# Patient Record
Sex: Male | Born: 1940
Health system: Southern US, Community
[De-identification: ages and names within clinical notes are randomized; demographics above are authoritative.]

## PROBLEM LIST (undated history)

## (undated) DIAGNOSIS — R3915 Urgency of urination: Secondary | ICD-10-CM

## (undated) DIAGNOSIS — M1712 Unilateral primary osteoarthritis, left knee: Secondary | ICD-10-CM

## (undated) DIAGNOSIS — A64 Unspecified sexually transmitted disease: Secondary | ICD-10-CM

## (undated) DIAGNOSIS — K208 Other esophagitis without bleeding: Secondary | ICD-10-CM

## (undated) DIAGNOSIS — R1013 Epigastric pain: Secondary | ICD-10-CM

## (undated) DIAGNOSIS — K311 Adult hypertrophic pyloric stenosis: Secondary | ICD-10-CM

## (undated) DIAGNOSIS — K219 Gastro-esophageal reflux disease without esophagitis: Secondary | ICD-10-CM

## (undated) DIAGNOSIS — K3189 Other diseases of stomach and duodenum: Secondary | ICD-10-CM

## (undated) DIAGNOSIS — E785 Hyperlipidemia, unspecified: Secondary | ICD-10-CM

## (undated) HISTORY — DX: Other diseases of stomach and duodenum: K31.89

## (undated) HISTORY — DX: Other esophagitis without bleeding: K20.80

## (undated) HISTORY — DX: Hyperlipidemia, unspecified: E78.5

## (undated) HISTORY — DX: Other esophagitis: K20.8

## (undated) HISTORY — PX: HERNIA REPAIR: SHX51

## (undated) HISTORY — DX: Adult hypertrophic pyloric stenosis: K31.1

## (undated) HISTORY — DX: Unspecified sexually transmitted disease: A64

## (undated) HISTORY — DX: Epigastric pain: R10.13

---

## 1994-08-07 HISTORY — PX: BUNIONECTOMY: SHX129

## 1994-08-07 HISTORY — PX: CIRCUMCISION: SUR203

## 2000-06-18 ENCOUNTER — Encounter: Payer: Self-pay | Admitting: Internal Medicine

## 2000-06-18 ENCOUNTER — Ambulatory Visit (HOSPITAL_COMMUNITY): Admission: RE | Admit: 2000-06-18 | Discharge: 2000-06-18 | Payer: Self-pay | Admitting: Internal Medicine

## 2007-03-06 ENCOUNTER — Ambulatory Visit: Payer: Self-pay | Admitting: Internal Medicine

## 2007-03-06 LAB — CONVERTED CEMR LAB
ALT: 22 units/L (ref 0–53)
AST: 24 units/L (ref 0–37)
Albumin: 4 g/dL (ref 3.5–5.2)
Alkaline Phosphatase: 86 units/L (ref 39–117)
BUN: 10 mg/dL (ref 6–23)
Basophils Absolute: 0 10*3/uL (ref 0.0–0.1)
Basophils Relative: 0.5 % (ref 0.0–1.0)
CO2: 32 meq/L (ref 19–32)
Calcium: 9.7 mg/dL (ref 8.4–10.5)
Chloride: 103 meq/L (ref 96–112)
Cholesterol: 264 mg/dL (ref 0–200)
Creatinine, Ser: 1.1 mg/dL (ref 0.4–1.5)
Direct LDL: 189.1 mg/dL
Eosinophils Absolute: 0.2 10*3/uL (ref 0.0–0.6)
Eosinophils Relative: 4 % (ref 0.0–5.0)
GFR calc Af Amer: 86 mL/min
GFR calc non Af Amer: 71 mL/min
Glucose, Bld: 104 mg/dL — ABNORMAL HIGH (ref 70–99)
HCT: 41.9 % (ref 39.0–52.0)
HDL: 30 mg/dL — ABNORMAL LOW (ref >39.0)
Hemoglobin: 14 g/dL (ref 13.0–17.0)
Lymphocytes Relative: 29 % (ref 12.0–46.0)
MCHC: 33.3 g/dL (ref 30.0–36.0)
MCV: 86.6 fL (ref 78.0–100.0)
Monocytes Absolute: 0.7 10*3/uL (ref 0.2–0.7)
Monocytes Relative: 12.6 % — ABNORMAL HIGH (ref 3.0–11.0)
Neutro Abs: 3.1 10*3/uL (ref 1.4–7.7)
Neutrophils Relative %: 53.9 % (ref 43.0–77.0)
Platelets: 235 10*3/uL (ref 150–400)
Potassium: 3.3 meq/L — ABNORMAL LOW (ref 3.5–5.1)
RBC: 4.84 M/uL (ref 4.22–5.81)
RDW: 12.8 % (ref 11.5–14.6)
Sodium: 141 meq/L (ref 135–145)
TSH: 0.99 microintl units/mL (ref 0.35–5.50)
Total Bilirubin: 1.1 mg/dL (ref 0.3–1.2)
Total CHOL/HDL Ratio: 8.8
Total Protein: 7.6 g/dL (ref 6.0–8.3)
Triglycerides: 170 mg/dL — ABNORMAL HIGH (ref 0–149)
VLDL: 34 mg/dL (ref 0–40)
WBC: 5.6 10*3/uL (ref 4.5–10.5)

## 2007-03-12 ENCOUNTER — Ambulatory Visit: Payer: Self-pay | Admitting: Internal Medicine

## 2007-03-14 ENCOUNTER — Ambulatory Visit: Payer: Self-pay | Admitting: Internal Medicine

## 2008-01-29 ENCOUNTER — Ambulatory Visit: Payer: Self-pay | Admitting: Internal Medicine

## 2008-01-29 DIAGNOSIS — K219 Gastro-esophageal reflux disease without esophagitis: Secondary | ICD-10-CM | POA: Insufficient documentation

## 2008-05-21 ENCOUNTER — Ambulatory Visit: Payer: Self-pay | Admitting: Internal Medicine

## 2008-05-21 DIAGNOSIS — F17200 Nicotine dependence, unspecified, uncomplicated: Secondary | ICD-10-CM | POA: Insufficient documentation

## 2008-05-23 DIAGNOSIS — K311 Adult hypertrophic pyloric stenosis: Secondary | ICD-10-CM | POA: Insufficient documentation

## 2008-05-23 DIAGNOSIS — K449 Diaphragmatic hernia without obstruction or gangrene: Secondary | ICD-10-CM | POA: Insufficient documentation

## 2008-06-30 ENCOUNTER — Encounter: Payer: Self-pay | Admitting: Internal Medicine

## 2008-12-01 ENCOUNTER — Ambulatory Visit: Payer: Self-pay | Admitting: Internal Medicine

## 2010-12-20 NOTE — Assessment & Plan Note (Signed)
[facility]                           PRIMARY CARE OFFICE NOTE   NAME:FERGUSONKadden, Osterhout                 MRN:          [id number]  DATE:03/06/2007                            DOB:          15-Jul-1941    Mr. Heart is a delightful 70 year old African-American gentleman,  last seen August 15, 2002, who presents for a followup evaluation and  exam.  He reports that in the interval he has been healthy with no new  medical problems, no surgeries, no injuries.   PAST MEDICAL HISTORY:   SURGICAL:  1. The patient had adult circumcision, 1996, secondary to balanitis.  2. Umbilical hernia repair.  3. Bunionectomy.   MEDICAL ILLNESSES:  1. Usual childhood disease.  2. __________ .  3. Episode of syphilis as a youth, medically treated.  4. Peptic ulcer disease.  5. Partial gout.  6. Gastric outlet obstruction.  7. Hiatal hernia.  8. Erosive esophagitis.   CURRENT MEDICATIONS:  No prescription drugs.   HABITS:  Tobacco, none,  the patient having quit smoking in 1994.  Alcohol, none, having quit drinking in 1994.   SOCIAL HISTORY:  The patient was married and divorced x2 and remains  single.  He has a grown daughter and grown son.  He has a third son who  has finished college.  He has 3 grandchildren.  The patient retired from  [employer] in 1996.  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   HEALTH MAINTENANCE:  The patient's last flexible sigmoidoscopy was  August of 2001 and was unremarkable.   REVIEW OF SYSTEMS:  Negative for any constitutional, cardiovascular,  respiratory, GI, or GU problems.   PHYSICAL EXAMINATION:  Temperature was 97.4, blood pressure 122/74,  pulse 100, weight 193, height 6 feet 2 inches.  GENERAL APPEARANCE:  A well-nourished, well-developed, mildly overweight  African-American gentleman in no acute distress.  HEENT EXAM:  Normocephalic, atraumatic.  EACs and  TMs were unremarkable.  Oropharynx with native dentition in good repair.  No buccal or palate  lesions were noted.  Posterior pharynx was clear.  Conjunctivae and  sclerae were clear.  PERRLA.  EOMI.  Funduscopic exam was unremarkable.  NECK:  Supple without thyromegaly.  NODES:  No lymphadenopathy was noted in the cervical or supraclavicular  regions.  CHEST:  No CVA tenderness.  LUNGS:  Clear to auscultation and percussion.  CARDIOVASCULAR:  2+ radial pulses, no JVD or carotid bruits.  He had a  quiet precordium with a regular rate and rhythm without murmurs, rubs,  or gallops.  ABDOMEN:  Soft, no guarding, no rebound.  No organosplenomegaly was  noted.  GENITALIA:  Normal male phallus.  Bilaterally descended testicles  without masses.  No evidence of recurrent balanitis.  RECTAL EXAM:  Normal sphincter tone was noted.  The prostate was smooth,  round, normal in size and contour without nodules.  EXTREMITIES:  The patient has a valgus deformity of the left knee with  significant knee enlargement.  Full range of motion is preserved, other  joints appear normal.  SKIN:  Clear.  NEUROLOGIC EXAM:  Nonfocal.   DATABASE:  The patient is sent for routine laboratories at today's  visit.  They include lipid panel, complete metabolic panel, CBC and TSH.   ASSESSMENT AND PLAN:  1. 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      He will be seeing Dr. Thurston Hole and will schedule his own appointment.      At this point I believe he is looking at total knee replacement.  2. Health maintenance:  The patient is scheduled for colonoscopy with      Dr. Lina Sar for August 19 at 1 o'clock with a preop visit      August 5 at 10:30, and the patient is aware of this appointment.   In summary, he is a pleasant gentleman who seems medically stable at  this time.  He will be notified by phone tree of his lab results with  appropriate medical recommendations as indicated by his  labs.  He will  keep his appointment for colonoscopy.   The patient will be contacting Dr. Thurston Hole for evaluation of a possible  total knee replacement.  If the patient is considering surgery, he is  medically stable and medically cleared for surgery.     Rosalyn Gess Norins, MD  Electronically Signed    MEN/MedQ  DD: 03/07/2007  DT: 03/07/2007  Job #: [id number]   cc:   Mr. Maycen Degregory A. Thurston Hole, M.D.

## 2012-01-30 ENCOUNTER — Ambulatory Visit (INDEPENDENT_AMBULATORY_CARE_PROVIDER_SITE_OTHER): Payer: 59 | Admitting: Internal Medicine

## 2012-01-30 ENCOUNTER — Other Ambulatory Visit (INDEPENDENT_AMBULATORY_CARE_PROVIDER_SITE_OTHER): Payer: 59

## 2012-01-30 ENCOUNTER — Encounter: Payer: Self-pay | Admitting: Internal Medicine

## 2012-01-30 VITALS — BP 122/94 | HR 80 | Temp 98.0°F | Resp 16 | Ht 74.0 in | Wt 201.0 lb

## 2012-01-30 DIAGNOSIS — Z136 Encounter for screening for cardiovascular disorders: Secondary | ICD-10-CM

## 2012-01-30 DIAGNOSIS — M1710 Unilateral primary osteoarthritis, unspecified knee: Secondary | ICD-10-CM

## 2012-01-30 DIAGNOSIS — Z0000 Encounter for general adult medical examination without abnormal findings: Secondary | ICD-10-CM

## 2012-01-30 DIAGNOSIS — E785 Hyperlipidemia, unspecified: Secondary | ICD-10-CM

## 2012-01-30 DIAGNOSIS — K3189 Other diseases of stomach and duodenum: Secondary | ICD-10-CM

## 2012-01-30 LAB — COMPREHENSIVE METABOLIC PANEL
Albumin: 4.2 g/dL (ref 3.5–5.2)
BUN: 18 mg/dL (ref 6–23)
CO2: 30 mEq/L (ref 19–32)
Calcium: 9.5 mg/dL (ref 8.4–10.5)
Chloride: 103 mEq/L (ref 96–112)
GFR: 85.87 mL/min (ref >60.00)
Glucose, Bld: 90 mg/dL (ref 70–99)
Potassium: 4 mEq/L (ref 3.5–5.1)
Sodium: 140 mEq/L (ref 135–145)
Total Protein: 7.9 g/dL (ref 6.0–8.3)

## 2012-01-30 LAB — HEPATIC FUNCTION PANEL
ALT: 22 U/L (ref 0–53)
Bilirubin, Direct: 0 mg/dL (ref 0.0–0.3)
Total Bilirubin: 0.4 mg/dL (ref 0.3–1.2)

## 2012-01-30 LAB — CBC WITH DIFFERENTIAL/PLATELET
Basophils Absolute: 0 10*3/uL (ref 0.0–0.1)
Basophils Relative: 0.2 % (ref 0.0–3.0)
Eosinophils Absolute: 0.1 10*3/uL (ref 0.0–0.7)
Eosinophils Relative: 1.3 % (ref 0.0–5.0)
HCT: 42.3 % (ref 39.0–52.0)
Hemoglobin: 14 g/dL (ref 13.0–17.0)
Lymphocytes Relative: 24.4 % (ref 12.0–46.0)
Lymphs Abs: 1.8 10*3/uL (ref 0.7–4.0)
MCHC: 33 g/dL (ref 30.0–36.0)
MCV: 86.9 fl (ref 78.0–100.0)
Monocytes Absolute: 0.7 10*3/uL (ref 0.1–1.0)
Monocytes Relative: 9.2 % (ref 3.0–12.0)
Neutro Abs: 4.8 10*3/uL (ref 1.4–7.7)
Neutrophils Relative %: 64.9 % (ref 43.0–77.0)
Platelets: 221 10*3/uL (ref 150.0–400.0)
RBC: 4.87 Mil/uL (ref 4.22–5.81)
RDW: 13.6 % (ref 11.5–14.6)
WBC: 7.4 10*3/uL (ref 4.5–10.5)

## 2012-01-30 LAB — LIPID PANEL
Cholesterol: 250 mg/dL — ABNORMAL HIGH (ref 0–200)
HDL: 56.4 mg/dL (ref >39.00)
Total CHOL/HDL Ratio: 4
Triglycerides: 174 mg/dL — ABNORMAL HIGH (ref 0.0–149.0)
VLDL: 34.8 mg/dL (ref 0.0–40.0)

## 2012-01-30 LAB — LDL CHOLESTEROL, DIRECT: Direct LDL: 175.4 mg/dL

## 2012-01-30 NOTE — Assessment & Plan Note (Signed)
LDL at 174 is out of control. Question of medication adherence  Plan  Check with patient on adherence  If adherent will need to change to either atorvastatin or aruvasttin

## 2012-01-30 NOTE — Progress Notes (Signed)
Subjective:    Patient ID: Brian Mora, male    DOB: 20-Feb-1941, 71 y.o.   MRN: [id number]  HPI The patient is here for annual Medicare wellness examination and management of other chronic and acute problems.  He has a bad knee left and is looking at TKR per Dr. Thurston Hole. He also has some dyspepsia for which he takes OTC H2 blockers as needed.  Otherwise he has been "ripping and runnin."   The risk factors are reflected in the social history.  The roster of all physicians providing medical care to patient - is listed in the Snapshot section of the chart.  Activities of daily living:  The patient is 100% inedpendent in all ADLs: dressing, toileting, feeding as well as independent mobility  Home safety : The patient has smoke detectors in the home. Fall - single story home fall safe. They wear seatbelts. firearms are present in the home, kept in a safe fashion. There is no violence in the home.   There is no risks for hepatitis, STDs or HIV. There is no   history of blood transfusion. They have no travel history to infectious disease endemic areas of the world.  The patient has seen their dentist in the last six month. They have not seen their eye doctor in the last year. They admit to hearing difficulty and have not had audiologic testing in the last year.  They do not  have excessive sun exposure. Discussed the need for sun protection: hats, long sleeves and use of sunscreen if there is significant sun exposure.   Diet: the importance of a healthy diet is discussed. They do have a bit of off  Diet, does take a supplement.  The patient has no regular exercise program.  The benefits of regular aerobic exercise were discussed.  Depression screen: there are no signs or vegative symptoms of depression- irritability, change in appetite, anhedonia, sadness/tearfullness.  Cognitive assessment: the patient manages all their financial and personal affairs and is actively engaged.   The  following portions of the patient's history were reviewed and updated as appropriate: allergies, current medications, past family history, past medical history,  past surgical history, past social history  and problem list.  Vision, hearing, body mass index were assessed and reviewed.   During the course of the visit the patient was educated and counseled about appropriate screening and preventive services including : fall prevention , diabetes screening, nutrition counseling, colorectal cancer screening, and recommended immunizations.  Past Medical History  Diagnosis Date  . 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 Other esophagitis     erosive esophagitis  . Acquired hypertrophic pyloric stenosis     gastric outlet obstruction  . Hyperlipidemia   . Dyspepsia and other specified disorders of function of stomach    Past Surgical History  Procedure Date  . Hernia repair   . Bunionectomy   . Circumcision 1996    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   Family History  Problem Relation Age of Onset  . Heart disease Mother   . Stroke Mother   . Benign prostatic hyperplasia Father   . Alzheimer's disease Father   . Diabetes Neg Hx   . COPD Neg Hx   . Cancer Sister    History   Social History  . Marital Status: Single    Spouse Name: N/A    Number of Children: 3  . Years of Education: 12   Occupational  History  . retired    Social History Main Topics  . Smoking status: Former Smoker    Quit date: 01/29/1993  . Smokeless tobacco: Never Used  . Alcohol Use: No  . Drug Use: No  . Sexually Active: Not on file   Other Topics Concern  . Not on file   Social History Narrative   HSG. Married '65 - 12 yrs/divorce, Married '80 -1 yr/divorced. 1 dtr - '67; 2 sons '63, '79; 4 grandchildren. Lives alone. 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    Current Outpatient Prescriptions on File Prior to Visit  Medication Sig Dispense  Refill  . lovastatin (MEVACOR) 20 MG tablet Take 20 mg by mouth at bedtime.          Review of Systems Constitutional:  Negative for fever, chills, activity change and unexpected weight change.  HEENT:  Negative for hearing loss, ear pain, congestion, neck stiffness and postnasal drip. Negative for sore throat or swallowing problems. Negative for dental complaints.   Eyes: Negative for vision loss or change in visual acuity.  Respiratory: Negative for chest tightness and wheezing. Negative for DOE.   Cardiovascular: Negative for chest pain or palpitations. No decreased exercise tolerance Gastrointestinal: No change in bowel habit. Positive for  bloating or gas,  reflux or indigestion Genitourinary: Negative for urgency, frequency, flank pain and difficulty urinating. nocturia x 1 Musculoskeletal: Negative for myalgias, back pain,  and gait problem. left knee arthritis - end stage. Neurological: Negative for dizziness, tremors, weakness and headaches.  Hematological: Negative for adenopathy.  Psychiatric/Behavioral: Negative for behavioral problems and dysphoric mood.       Objective:   Physical Exam Filed Vitals:   01/30/12 1047  BP: 122/94  Pulse: 80  Temp: 98 F (36.7 C)  Resp: 16   Wt Readings from Last 3 Encounters:  01/30/12 201 lb (91.173 kg)  12/01/08 195 lb (88.451 kg)  05/21/08 195 lb 8 oz (88.678 kg)    Gen'l: Well nourished well developed AA male in no acute distress  HEENT: Head: Normocephalic and atraumatic. Right Ear: External ear normal. EAC/TM nl. Left Ear: External ear normal.  EAC/TM nl. Nose: Nose normal. Mouth/Throat: Oropharynx is clear and moist. Dentition - native, in good repair. No buccal or palatal lesions. Posterior pharynx clear. Eyes: Conjunctivae and sclera clear. EOM intact. Pupils are equal, round, and reactive to light. Right eye exhibits no discharge. Left eye exhibits no discharge. Neck: Normal range of motion. Neck supple. No JVD present. No  tracheal deviation present. No thyromegaly present.  Cardiovascular: Normal rate, regular rhythm, no gallop, no friction rub, no murmur heard.      Quiet precordium. 2+ radial and DP pulses . No carotid bruits Pulmonary/Chest: Effort normal. No respiratory distress or increased WOB, no wheezes, no rales. No chest wall deformity or CVAT. Abdominal: Soft. Bowel sounds are normal in all quadrants. He exhibits mild distension, no tenderness, no rebound or guarding, No heptosplenomegaly  Genitourinary:  deferred Musculoskeletal: Normal range of motion. He exhibits no edema and no tenderness.       Small and large joints without redness, synovial thickening. Valgus deformity left knee. Full range of motion preserved about all small, median and large joints. Mildly atalgic gait Lymphadenopathy:    He has no cervical or supraclavicular adenopathy.  Neurological: He is alert and oriented to person, place, and time. CN II-XII intact. DTRs 2+ and symmetrical biceps, radial and patellar tendons. Cerebellar function normal with no tremor, rigidity,  normal gait and station.  Skin: Skin is warm and dry. No rash noted. No erythema.  Psychiatric: He has a normal mood and affect. His behavior is normal. Thought content normal.   Lab Results  Component Value Date   WBC 7.4 01/30/2012   HGB 14.0 01/30/2012   HCT 42.3 01/30/2012   PLT 221.0 01/30/2012   GLUCOSE 90 01/30/2012   CHOL 250* 01/30/2012   TRIG 174.0* 01/30/2012   HDL 56.40 01/30/2012   LDLDIRECT 175.4 01/30/2012        ALT 22 01/30/2012   AST 26 01/30/2012        NA 140 01/30/2012   K 4.0 01/30/2012   CL 103 01/30/2012   CREATININE 1.1 01/30/2012   BUN 18 01/30/2012   CO2 30 01/30/2012   TSH 0.99 03/06/2007   12 lead EKG - normal study w/o signs of ischemia or strain      Assessment & Plan:

## 2012-01-30 NOTE — Assessment & Plan Note (Signed)
Intermittent symptoms for which he takes prn H2 blockers and antacids  Plan Take Zantac on a regular basis, starting with qHS and moving to BID if needed.

## 2012-01-30 NOTE — Assessment & Plan Note (Signed)
Interval history significant for progressive knee pain and disability. PHysical exam is notable for mild weight gain. No EMR records for colonoscopy and immunization - will need follow-up.  In summary - a nice man who is medically stable but with poorly controlled cholesterol and who needs immunizations and colorectal cancer screening.

## 2012-01-30 NOTE — Assessment & Plan Note (Signed)
End-stage DJD left knee and for TKR in the next 6 months - Dr. Thurston Hole.  Plan Patient is medical stable and cleared for surgery and anesthesia

## 2012-01-31 ENCOUNTER — Telehealth: Payer: Self-pay | Admitting: *Deleted

## 2012-01-31 DIAGNOSIS — E785 Hyperlipidemia, unspecified: Secondary | ICD-10-CM

## 2012-01-31 NOTE — Telephone Encounter (Signed)
Patient called. States has not taken Mevacor in over a month, states takes it off and on.

## 2012-01-31 NOTE — Telephone Encounter (Signed)
Message copied by Elnora Morrison on Wed Jan 31, 2012  8:46 AM ------      Message from: Jacques Navy      Created: Tue Jan 30, 2012  7:24 PM       1. Call patient and ask if he is tking mevacor      2. Pull old chart for last colonoscopy and immunizations.

## 2012-01-31 NOTE — Telephone Encounter (Signed)
Take the mevacor and return in 1 month for lab.

## 2012-02-01 NOTE — Telephone Encounter (Signed)
Patient notified to take medication  Mevacor every day as prescribed and to follow up for lab work in one month

## 2012-02-02 ENCOUNTER — Other Ambulatory Visit: Payer: Self-pay | Admitting: Internal Medicine

## 2012-03-05 ENCOUNTER — Other Ambulatory Visit (INDEPENDENT_AMBULATORY_CARE_PROVIDER_SITE_OTHER): Payer: 59

## 2012-03-05 DIAGNOSIS — E785 Hyperlipidemia, unspecified: Secondary | ICD-10-CM

## 2012-03-05 LAB — LIPID PANEL
Cholesterol: 205 mg/dL — ABNORMAL HIGH (ref 0–200)
Total CHOL/HDL Ratio: 3
Triglycerides: 186 mg/dL — ABNORMAL HIGH (ref 0.0–149.0)
VLDL: 37.2 mg/dL (ref 0.0–40.0)

## 2012-03-15 ENCOUNTER — Encounter: Payer: Self-pay | Admitting: Internal Medicine

## 2012-10-24 ENCOUNTER — Encounter: Payer: Self-pay | Admitting: *Deleted

## 2012-10-25 ENCOUNTER — Ambulatory Visit (INDEPENDENT_AMBULATORY_CARE_PROVIDER_SITE_OTHER): Payer: Medicare Other | Admitting: Podiatry

## 2012-10-25 ENCOUNTER — Encounter: Payer: Self-pay | Admitting: Podiatry

## 2012-10-25 VITALS — BP 141/96 | HR 84 | Ht 74.0 in | Wt 193.0 lb

## 2012-10-25 DIAGNOSIS — M21619 Bunion of unspecified foot: Secondary | ICD-10-CM

## 2012-10-25 DIAGNOSIS — M2012 Hallux valgus (acquired), left foot: Secondary | ICD-10-CM

## 2012-10-25 DIAGNOSIS — B351 Tinea unguium: Secondary | ICD-10-CM

## 2012-10-25 DIAGNOSIS — L84 Corns and callosities: Secondary | ICD-10-CM

## 2012-10-25 NOTE — Progress Notes (Signed)
Subjective: 72 y.o. year old male patient presents complaining of painful nails and callus under the right great toe. Patient requests toe nails, corns and calluses trimmed.  No new complaints. This is regular 3 month check up for the same problem.   Review of Systems - General ROS: negative for - chills, fatigue, fever, malaise, night sweats, sleep disturbance, weight loss or abnormal issues.  Objective: Dermatologic: Thick yellow deformed nails right, hallux, 2nd toe, 3rd toe, 4th toe right. Left foot toe nails are in better shape with minimum deformities. Positive of ingrown nails 1st toe and right. No open lesions or abnormal skin other than callus under the great toe. Vascular: Pedal pulses are all palpable. Orthopedic: Contracted lesser digits 4th, 5th bilateral. Severe Hallux valgus with bunion deformity on left. Hyperextended hallux at IPJ bilateral. S/P bunion surgery on right, still has bump over the first MPJ right. Neurologic: All epicritic and tactile sensations grossly intact.  Assessment: 1. Dystrophic mycotic nails x 5 right. 2. Plantar callus under the hallux R>L. (Right foot bothers him.) 3. Hallux valgus with bunion with overlapping first and 2nd digit left. (Patient wants to get it fixed some day.)  Treatment: All mycotic nails, corns, calluses debrided.  Return in 3 months or as needed.

## 2012-10-25 NOTE — Patient Instructions (Addendum)
Conditions seen for:  Toe nails and calluses on big toes. Treatment rendered today: Debrided all hypertrophic nails and calluses under the great toes. Instructions: Continue current level of care. Return appointment: Return in 3 month or sooner if needed.

## 2013-01-27 ENCOUNTER — Ambulatory Visit: Payer: Medicare Other | Admitting: Podiatry

## 2013-01-28 ENCOUNTER — Ambulatory Visit (INDEPENDENT_AMBULATORY_CARE_PROVIDER_SITE_OTHER): Payer: Medicare Other | Admitting: Podiatry

## 2013-01-28 VITALS — BP 123/77 | HR 85

## 2013-01-28 DIAGNOSIS — B351 Tinea unguium: Secondary | ICD-10-CM

## 2013-01-28 DIAGNOSIS — L84 Corns and callosities: Secondary | ICD-10-CM | POA: Insufficient documentation

## 2013-01-28 DIAGNOSIS — M25579 Pain in unspecified ankle and joints of unspecified foot: Secondary | ICD-10-CM

## 2013-01-28 NOTE — Progress Notes (Signed)
72 year old male presents requesting toe nails and calluses trimmed. Right great toe is hurting under bottom from callus.  Objective: Thick dystrophic nails x 10. Hallux valgus with bunion bilateral. Hyperextended hallux bilateral.  Assessment: Onychomycosis x 10. Callus under both great toe bilateral. Painful feet. HAV with bunion bilateral.  Plan: Palliation prn. Debrided all nails and calluses.

## 2013-03-29 ENCOUNTER — Other Ambulatory Visit: Payer: Self-pay | Admitting: Internal Medicine

## 2013-03-31 ENCOUNTER — Telehealth: Payer: Self-pay | Admitting: Internal Medicine

## 2013-03-31 MED ORDER — LOVASTATIN 20 MG PO TABS: 20.0000 mg | ORAL_TABLET | Freq: Every day | ORAL | Status: DC

## 2013-03-31 NOTE — Telephone Encounter (Signed)
90day supply sent.

## 2013-03-31 NOTE — Telephone Encounter (Signed)
Request refill on   Lovastatin, request 90day supply.   30 Day supply was already sent.

## 2013-04-09 ENCOUNTER — Encounter: Payer: Self-pay | Admitting: Internal Medicine

## 2013-04-09 ENCOUNTER — Ambulatory Visit (INDEPENDENT_AMBULATORY_CARE_PROVIDER_SITE_OTHER): Payer: 59 | Admitting: Internal Medicine

## 2013-04-09 VITALS — BP 114/76 | HR 97 | Temp 98.8°F | Ht 73.0 in | Wt 196.0 lb

## 2013-04-09 DIAGNOSIS — Z1211 Encounter for screening for malignant neoplasm of colon: Secondary | ICD-10-CM

## 2013-04-09 DIAGNOSIS — E785 Hyperlipidemia, unspecified: Secondary | ICD-10-CM

## 2013-04-09 DIAGNOSIS — Z0000 Encounter for general adult medical examination without abnormal findings: Secondary | ICD-10-CM

## 2013-04-09 DIAGNOSIS — Z23 Encounter for immunization: Secondary | ICD-10-CM

## 2013-04-09 NOTE — Progress Notes (Signed)
Subjective:    Patient ID: Brian Mora, male    DOB: August 30, 1940, 72 y.o.   MRN: [id number]  HPI The patient is here for annual Medicare wellness examination and management of other chronic and acute problems.  Brian Mora presents for general exam. His CC: diffuse MSK complaints: muscle soreness and joint pan but he has not limitations in activity. No major illness, no surgery or injury. He has put off left TKR.   The risk factors are reflected in the social history.  The roster of all physicians providing medical care to patient - is listed in the Snapshot section of the chart.  Activities of daily living:  The patient is 100% inedpendent in all ADLs: dressing, toileting, feeding as well as independent mobility  Home safety : The patient has smoke detectors in the home. Falls - none.  They wear seatbelts.  firearms are present in the home, kept in a safe fashion. There is no violence in the home.   There is no risks for hepatitis, STDs or HIV. There is no history of blood transfusion. They have no travel history to infectious disease endemic areas of the world.  The patient has seen their dentist in the last six month. They have not seen their eye doctor in the last year. They deny any hearing difficulty and have not had audiologic testing in the last year.    They do not  have excessive sun exposure. Discussed the need for sun protection: hats, long sleeves and use of sunscreen if there is significant sun exposure.   Diet: the importance of a healthy diet is discussed. They do have a healthy diet.  The patient has a regular exercise program: treadmill/stationary bike , 30 min duration, 7 per week.  The benefits of regular aerobic exercise were discussed.  Depression screen: there are no signs or vegative symptoms of depression- irritability, change in appetite, anhedonia, sadness/tearfullness.  Cognitive assessment: the patient manages all their financial and personal  affairs and is actively engaged.   The following portions of the patient's history were reviewed and updated as appropriate: allergies, current medications, past family history, past medical history,  past surgical history, past social history  and problem list.  Vision, hearing, body mass index were assessed and reviewed.   Past Medical History  Diagnosis Date  . 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 Other esophagitis     erosive esophagitis  . Acquired hypertrophic pyloric stenosis     gastric outlet obstruction  . Hyperlipidemia   . Dyspepsia and other specified disorders of function of stomach    Past Surgical History  Procedure Laterality Date  . Hernia repair    . Bunionectomy Right 1996  . Circumcision  1996    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   Family History  Problem Relation Age of Onset  . Heart disease Mother   . Stroke Mother   . Benign prostatic hyperplasia Father   . Alzheimer's disease Father   . Diabetes Neg Hx   . COPD Neg Hx   . Cancer Sister    History   Social History  . Marital Status: Single    Spouse Name: N/A    Number of Children: 3  . Years of Education: 12   Occupational History  . retired    Social History Main Topics  . Smoking status: Former Smoker    Quit date: 01/29/1993  . Smokeless tobacco: Never Used  Comment: Quit in 1989  . Alcohol Use: No  . Drug Use: No  . Sexual Activity: Not on file   Other Topics Concern  . Not on file   Social History Narrative   HSG. Married '65 - 12 yrs/divorce, Married '80 -1 yr/divorced. 1 dtr - '67; 2 sons '63, '79; 4 grandchildren. Lives alone. 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     Current Outpatient Prescriptions on File Prior to Visit  Medication Sig Dispense Refill  . Aspirin-Salicylamide-Caffeine (BC HEADACHE POWDER PO) Take by mouth as needed.      . lovastatin (MEVACOR) 20 MG tablet Take 1 tablet (20 mg total) by  mouth daily.  90 tablet  0  . Naproxen Sodium (ALEVE) 220 MG CAPS Take by mouth as needed.      . ranitidine (ZANTAC) 150 MG capsule Take 150 mg by mouth daily as needed for heartburn.      . Simethicone (GAS-X PO) as needed.       No current facility-administered medications on file prior to visit.     During the course of the visit the patient was educated and counseled about appropriate screening and preventive services including : fall prevention , diabetes screening, nutrition counseling, colorectal cancer screening, and recommended immunizations.    Review of Systems System review is negative for any constitutional, cardiac, pulmonary, GI or neuro symptoms or complaints other than as described in the HPI.     Objective:   Physical Exam Filed Vitals:   04/09/13 1319  BP: 114/76  Pulse: 97  Temp: 98.8 F (37.1 C)   Wt Readings from Last 3 Encounters:  04/09/13 196 lb (88.905 kg)  10/25/12 193 lb (87.544 kg)  01/30/12 201 lb (91.173 kg)   Gen'l: Well nourished well developed male in no acute distress  HEENT: Head: Normocephalic and atraumatic. Right Ear: External ear normal. EAC/TM nl. Left Ear: External ear normal.  EAC/TM nl. Nose: Nose normal. Mouth/Throat: Oropharynx is clear and moist. Dentition - native, in good repair. No buccal or palatal lesions. Posterior pharynx clear. Eyes: Conjunctivae and sclera clear. EOM intact. Pupils are equal, round, and reactive to light. Right eye exhibits no discharge. Left eye exhibits no discharge. Neck: Normal range of motion. Neck supple. No JVD present. No tracheal deviation present. No thyromegaly present.  Cardiovascular: Normal rate, regular rhythm, no gallop, no friction rub, no murmur heard.      Quiet precordium. 2+ radial and DP pulses . No carotid bruits Pulmonary/Chest: Effort normal. No respiratory distress or increased WOB, no wheezes, no rales. No chest wall deformity or CVAT. Abdomen: Soft. Bowel sounds are normal in all  quadrants. He exhibits no distension, no tenderness, no rebound or guarding, No heptosplenomegaly  Genitourinary:  deferred to age Musculoskeletal: Normal range of motion. He exhibits no edema and no tenderness.       Small and large joints without redness, synovial thickening or deformity. Full range of motion preserved about all small, median and large joints.  Lymphadenopathy:    He has no cervical or supraclavicular adenopathy.  Neurological: He is alert and oriented to person, place, and time. CN II-XII intact. DTRs 2+ and symmetrical biceps, radial and patellar tendons. Cerebellar function normal with no tremor, rigidity, normal gait and station.  Skin: Skin is warm and dry. No rash noted. No erythema.  Psychiatric: He has a normal mood and affect. His behavior is normal. Thought content normal.  Assessment & Plan:

## 2013-04-09 NOTE — Patient Instructions (Addendum)
Thanks for coming in to see me.  You appear to be doing well. Exam is normal.  Immunization today: tetanus and pneumonia vaccine.  You will be scheduled for routine colonoscopy - you should get a call in several days about the appointment times  Lab today: cholesterol, sugar, kidney function - you will receive a letter with the results.  If all looks good I will see you in a year.

## 2013-04-12 NOTE — Assessment & Plan Note (Signed)
Lab from 2013 with LDL 117.  Plan Follow up lab with recommendations to follow.

## 2013-04-12 NOTE — Assessment & Plan Note (Signed)
Interval history negative for any major illness, surgery or injury. Physical exam is normal. Full labs pending. Patient is advised to have colonoscopy for colorectal cancer screening. Per ACU guidelines he is aged out for prostate cancer screening. Immunizations: tetanus and pneumonia done. He will need shingles and flu shot.   He will return for lab and for additional immunizations. He is referred to GI for screening colonoscopy.

## 2013-04-12 NOTE — Assessment & Plan Note (Signed)
No complaints of swallow pain or problems. Taking no medications.

## 2013-04-14 ENCOUNTER — Telehealth: Payer: Self-pay

## 2013-04-14 NOTE — Telephone Encounter (Signed)
Message copied by Noreene Larsson on Mon Apr 14, 2013  8:25 AM ------      Message from: Jacques Navy      Created: Sat Apr 12, 2013  6:12 AM       Labs were ordered at his visit Sept 3rd but there are no results. Please call to remind him to come in for lab work.            Thanks ------

## 2013-04-14 NOTE — Telephone Encounter (Signed)
Do not see a phone number listed for the patient so I mailed this correspondence reminding him of labs that need to be done.

## 2013-04-17 ENCOUNTER — Telehealth: Payer: Self-pay | Admitting: Internal Medicine

## 2013-04-17 ENCOUNTER — Other Ambulatory Visit (INDEPENDENT_AMBULATORY_CARE_PROVIDER_SITE_OTHER): Payer: 59

## 2013-04-17 DIAGNOSIS — E785 Hyperlipidemia, unspecified: Secondary | ICD-10-CM

## 2013-04-17 LAB — COMPREHENSIVE METABOLIC PANEL
ALT: 17 U/L (ref 0–53)
Alkaline Phosphatase: 62 U/L (ref 39–117)
CO2: 30 mEq/L (ref 19–32)
Creatinine, Ser: 1.1 mg/dL (ref 0.4–1.5)
GFR: 86.49 mL/min (ref >60.00)
Sodium: 140 mEq/L (ref 135–145)
Total Bilirubin: 0.7 mg/dL (ref 0.3–1.2)
Total Protein: 7.4 g/dL (ref 6.0–8.3)

## 2013-04-17 LAB — LIPID PANEL
Cholesterol: 204 mg/dL — ABNORMAL HIGH (ref 0–200)
HDL: 53.6 mg/dL (ref >39.00)
Total CHOL/HDL Ratio: 4
VLDL: 22.8 mg/dL (ref 0.0–40.0)

## 2013-04-17 LAB — HEPATIC FUNCTION PANEL
ALT: 17 U/L (ref 0–53)
AST: 23 U/L (ref 0–37)
Albumin: 4.1 g/dL (ref 3.5–5.2)
Alkaline Phosphatase: 62 U/L (ref 39–117)

## 2013-04-17 NOTE — Telephone Encounter (Signed)
OK, please call GI to cancel thanks

## 2013-04-17 NOTE — Telephone Encounter (Signed)
Pt wants to cancel referral to GI

## 2013-04-18 ENCOUNTER — Encounter: Payer: Self-pay | Admitting: Internal Medicine

## 2013-04-18 NOTE — Telephone Encounter (Signed)
Done, spoke to [place]

## 2013-04-30 ENCOUNTER — Encounter: Payer: Self-pay | Admitting: Podiatry

## 2013-04-30 ENCOUNTER — Ambulatory Visit (INDEPENDENT_AMBULATORY_CARE_PROVIDER_SITE_OTHER): Payer: Medicare Other | Admitting: Podiatry

## 2013-04-30 VITALS — BP 116/72 | HR 100 | Ht 73.0 in | Wt 196.0 lb

## 2013-04-30 DIAGNOSIS — L84 Corns and callosities: Secondary | ICD-10-CM

## 2013-04-30 DIAGNOSIS — B351 Tinea unguium: Secondary | ICD-10-CM

## 2013-04-30 DIAGNOSIS — M25579 Pain in unspecified ankle and joints of unspecified foot: Secondary | ICD-10-CM

## 2013-04-30 NOTE — Patient Instructions (Addendum)
Seen for calluses and thick nails.  Debrided all calluses and toe nails. Still has bunion deformity. Return as needed.

## 2013-04-30 NOTE — Progress Notes (Signed)
Subjective: 72 year old male presents requesting toe nails and calluses trimmed. Calluses under the big toe joints are painful on both feet. Reports no new problems other than having a sore arm from flu shot that he had 3 weeks ago at PCP office.  Objective: Thick dystrophic nails x 10.  Hallux valgus with bunion bilateral.  Hyperextended hallux bilateral with plantar callus.  Assessment: Onychomycosis x 10.  Callus under both great toe bilateral.  Painful feet.  HAV with bunion bilateral.   Plan: Debrided all nails and calluses. Return as needed.

## 2013-06-30 ENCOUNTER — Other Ambulatory Visit: Payer: Self-pay

## 2013-06-30 MED ORDER — LOVASTATIN 20 MG PO TABS: 20.0000 mg | ORAL_TABLET | Freq: Every day | ORAL | Status: DC

## 2013-07-29 ENCOUNTER — Encounter: Payer: Self-pay | Admitting: Podiatry

## 2013-07-29 ENCOUNTER — Ambulatory Visit (INDEPENDENT_AMBULATORY_CARE_PROVIDER_SITE_OTHER): Payer: Medicare Other | Admitting: Podiatry

## 2013-07-29 VITALS — BP 130/80 | HR 89 | Ht 73.0 in | Wt 200.0 lb

## 2013-07-29 DIAGNOSIS — M25579 Pain in unspecified ankle and joints of unspecified foot: Secondary | ICD-10-CM

## 2013-07-29 DIAGNOSIS — B351 Tinea unguium: Secondary | ICD-10-CM

## 2013-07-29 NOTE — Progress Notes (Signed)
Subjective: 72 year old male presents requesting toe nails and calluses trimmed. Calluses under the big toe joints are painful on both feet.   Objective: Thick dystrophic nails x 10.  Hallux valgus with bunion bilateral.  Hyperextended hallux bilateral with plantar callus.   Assessment: Onychomycosis x 10.  Callus under both great toe bilateral.  Painful feet.  HAV with bunion bilateral.   Plan: Debrided all nails and calluses.  Return as needed.

## 2013-07-29 NOTE — Patient Instructions (Signed)
Seen for painful callus and nails. All debrided. No new problems noted. Return in 3 months.

## 2013-10-28 ENCOUNTER — Encounter: Payer: Self-pay | Admitting: Podiatry

## 2013-10-28 ENCOUNTER — Ambulatory Visit (INDEPENDENT_AMBULATORY_CARE_PROVIDER_SITE_OTHER): Payer: Medicare Other | Admitting: Podiatry

## 2013-10-28 VITALS — BP 129/79 | HR 94 | Ht 73.0 in | Wt 195.0 lb

## 2013-10-28 DIAGNOSIS — B351 Tinea unguium: Secondary | ICD-10-CM

## 2013-10-28 DIAGNOSIS — M79606 Pain in leg, unspecified: Secondary | ICD-10-CM | POA: Insufficient documentation

## 2013-10-28 DIAGNOSIS — M79609 Pain in unspecified limb: Secondary | ICD-10-CM

## 2013-10-28 NOTE — Progress Notes (Signed)
Subjective:  73 year old male presents requesting toe nails and calluses trimmed.  Calluses under the big toe joints are painful on both feet.  Stated that he has no new problems.   Objective: Bruised and purple nail 2nd left.  Thick dystrophic nails x 10.  Hallux valgus with bunion bilateral.  Hyperextended hallux bilateral with IPJ plantar callus.   Assessment: Onychomycosis x 10.  Callus under both great toe bilateral.  Painful feet.  HAV with bunion bilateral.   Plan: Debrided all nails and calluses.  Return as needed.

## 2013-10-28 NOTE — Patient Instructions (Signed)
Seen for hypertrophic nails and calluses. All nails and calluses debrided. Return in 3 months or as needed.  

## 2013-10-29 ENCOUNTER — Ambulatory Visit (INDEPENDENT_AMBULATORY_CARE_PROVIDER_SITE_OTHER): Payer: 59 | Admitting: Internal Medicine

## 2013-10-29 ENCOUNTER — Encounter: Payer: Self-pay | Admitting: Internal Medicine

## 2013-10-29 VITALS — BP 130/80 | HR 98 | Temp 98.3°F | Wt 197.2 lb

## 2013-10-29 DIAGNOSIS — Z7689 Persons encountering health services in other specified circumstances: Secondary | ICD-10-CM

## 2013-10-29 DIAGNOSIS — Z7189 Other specified counseling: Secondary | ICD-10-CM

## 2013-10-29 NOTE — Patient Instructions (Signed)
Thanks for coming in.   Your labs in September were all good and should be repeated in Sept '15 at your annual exam.  Please give consideration to screening for colon cancer using the "Colo-Guard" stool study - easy to do.  You will be seeing Stacy Gardner, PA - she will do a good job for you.   Thanks for letting me be your doctor over the years.

## 2013-10-29 NOTE — Progress Notes (Signed)
   Subjective:    Patient ID: Brian Mora, male    DOB: 03-28-1941, 73 y.o.   MRN: [id number]  HPI Brian Mora presents for follow -up and reassignment.   Review of Systems     Objective:   Physical Exam        Assessment & Plan:

## 2013-10-29 NOTE — Progress Notes (Signed)
Pre visit review using our clinic review tool, if applicable. No additional management support is needed unless otherwise documented below in the visit note. 

## 2014-01-28 ENCOUNTER — Encounter: Payer: Self-pay | Admitting: Podiatry

## 2014-01-28 ENCOUNTER — Ambulatory Visit (INDEPENDENT_AMBULATORY_CARE_PROVIDER_SITE_OTHER): Payer: Medicare Other | Admitting: Podiatry

## 2014-01-28 VITALS — BP 124/79 | HR 100 | Ht 74.0 in | Wt 201.0 lb

## 2014-01-28 DIAGNOSIS — B351 Tinea unguium: Secondary | ICD-10-CM

## 2014-01-28 DIAGNOSIS — M79606 Pain in leg, unspecified: Secondary | ICD-10-CM

## 2014-01-28 DIAGNOSIS — M79609 Pain in unspecified limb: Secondary | ICD-10-CM

## 2014-01-28 NOTE — Patient Instructions (Signed)
Seen for hypertrophic nails. All nails debrided. Return in 3 months or as needed.  

## 2014-01-28 NOTE — Progress Notes (Signed)
Subjective:  73 year old male presents requesting toe nails and calluses trimmed.  Calluses under the big toe joints are painful on both feet.  Stated that he has no new problems.   Objective:  Thick dystrophic nails x 10.  Hallux valgus with bunion bilateral.  Hyperextended hallux bilateral with IPJ plantar callus.   Assessment: Onychomycosis x 10.  Callus under both great toe bilateral.  Painful feet.  HAV with bunion bilateral.   Plan: Debrided all nails and calluses.  Return as needed.

## 2014-05-01 ENCOUNTER — Encounter: Payer: Self-pay | Admitting: Podiatry

## 2014-05-01 ENCOUNTER — Ambulatory Visit (INDEPENDENT_AMBULATORY_CARE_PROVIDER_SITE_OTHER): Payer: Medicare Other | Admitting: Podiatry

## 2014-05-01 VITALS — BP 136/84 | HR 104 | Ht 74.0 in | Wt 204.0 lb

## 2014-05-01 DIAGNOSIS — B351 Tinea unguium: Secondary | ICD-10-CM

## 2014-05-01 DIAGNOSIS — M79609 Pain in unspecified limb: Secondary | ICD-10-CM

## 2014-05-01 DIAGNOSIS — M79604 Pain in right leg: Secondary | ICD-10-CM

## 2014-05-01 NOTE — Patient Instructions (Signed)
Seen for hypertrophic nails. All nails debrided. Return in 3 months or as needed.  

## 2014-05-01 NOTE — Progress Notes (Signed)
Subjective:  73 year old male presents requesting toe nails and calluses trimmed. Stated that he wants to have left foot bunion corrected but too scared to go through at this time. Stated that he has no new problems.   Objective:  Thick dystrophic nails x 10.  Hallux valgus with bunion bilateral.  Hyperextended hallux bilateral with IPJ plantar callus.   Assessment: Onychomycosis x 10.  Callus under left great toe IPJ plantar surface.  Painful feet.  HAV with bunion bilateral.   Plan: Debrided all nails and calluses.  Return as needed.

## 2014-05-05 ENCOUNTER — Encounter: Payer: 59 | Admitting: Physician Assistant

## 2014-05-05 ENCOUNTER — Encounter: Payer: 59 | Admitting: Internal Medicine

## 2014-05-11 ENCOUNTER — Ambulatory Visit (INDEPENDENT_AMBULATORY_CARE_PROVIDER_SITE_OTHER): Payer: 59 | Admitting: Internal Medicine

## 2014-05-11 ENCOUNTER — Encounter: Payer: Self-pay | Admitting: Internal Medicine

## 2014-05-11 VITALS — BP 152/80 | HR 80 | Temp 98.1°F | Resp 16 | Ht 74.0 in | Wt 210.0 lb

## 2014-05-11 DIAGNOSIS — E785 Hyperlipidemia, unspecified: Secondary | ICD-10-CM | POA: Insufficient documentation

## 2014-05-11 DIAGNOSIS — R030 Elevated blood-pressure reading, without diagnosis of hypertension: Secondary | ICD-10-CM

## 2014-05-11 DIAGNOSIS — K2100 Gastro-esophageal reflux disease with esophagitis, without bleeding: Secondary | ICD-10-CM

## 2014-05-11 DIAGNOSIS — IMO0001 Reserved for inherently not codable concepts without codable children: Secondary | ICD-10-CM

## 2014-05-11 DIAGNOSIS — I10 Essential (primary) hypertension: Secondary | ICD-10-CM | POA: Insufficient documentation

## 2014-05-11 DIAGNOSIS — Z0000 Encounter for general adult medical examination without abnormal findings: Secondary | ICD-10-CM | POA: Insufficient documentation

## 2014-05-11 NOTE — Assessment & Plan Note (Signed)
Continue with current prescription therapy as reflected on the Med list. labs 

## 2014-05-11 NOTE — Progress Notes (Signed)
Pre visit review using our clinic review tool, if applicable. No additional management support is needed unless otherwise documented below in the visit note. 

## 2014-05-11 NOTE — Assessment & Plan Note (Signed)
Continue with current prescription therapy as reflected on the Med list.  

## 2014-05-11 NOTE — Assessment & Plan Note (Addendum)
Here for medicare wellness/physical  Diet: heart healthy  Physical activity: sedentary  Depression/mood screen: negative  Hearing: intact to whispered voice  Visual acuity: grossly normal, performs annual eye exam  ADLs: capable  Fall risk: none  Home safety: good  Cognitive evaluation: intact to orientation, naming, recall and repetition  EOL planning: adv directives, full code/ I agree  I have personally reviewed and have noted  1. The patient's medical and social history  2. Their use of alcohol, tobacco or illicit drugs  3. Their current medications and supplements  4. The patient's functional ability including ADL's, fall risks, home safety risks and hearing or visual impairment.  5. Diet and physical activities  6. Evidence for depression or mood disorders    Today patient counseled on age appropriate routine health concerns for screening and prevention, each reviewed and up to date or declined. Immunizations reviewed and up to date or declined. Labs ordered and reviewed. Risk factors for depression reviewed and negative. Hearing function and visual acuity are intact. ADLs screened and addressed as needed. Functional ability and level of safety reviewed and appropriate. Education, counseling and referrals performed based on assessed risks today. Patient provided with a copy of personalized plan for preventive services.   Flex sig  1993. Declined colonoscopy. Cologuard discussed  Immunizations: Tetanus Sept '14, Pneumonia Vaccine Sept 14  Labs

## 2014-05-11 NOTE — Assessment & Plan Note (Addendum)
Monitor BP Labs NAS diet RTC 3 mo

## 2014-05-11 NOTE — Patient Instructions (Signed)

## 2014-05-11 NOTE — Progress Notes (Signed)
   Subjective:     HPI  Former pt of Dr Linda Hedges  The patient is here for a wellness exam. The patient has been doing well overall without major physical or psychological issues going on lately. The patient needs to address  chronic OA; to address chronic  hyperlipidemia controlled with medicines as well; and to address GERD, controlled with medical treatment and diet.  BP Readings from Last 3 Encounters:  05/11/14 152/80  05/01/14 136/84  01/28/14 124/79   Wt Readings from Last 3 Encounters:  05/11/14 210 lb (95.255 kg)  05/01/14 204 lb (92.534 kg)  01/28/14 201 lb (91.173 kg)       Review of Systems  Constitutional: Negative for appetite change, fatigue and unexpected weight change.  HENT: Negative for congestion, nosebleeds, sneezing, sore throat and trouble swallowing.   Eyes: Negative for itching and visual disturbance.  Respiratory: Negative for cough.   Cardiovascular: Negative for chest pain, palpitations and leg swelling.  Gastrointestinal: Negative for nausea, diarrhea, blood in stool and abdominal distention.  Genitourinary: Negative for frequency and hematuria.  Musculoskeletal: Positive for arthralgias. Negative for back pain, gait problem, joint swelling and neck pain.  Skin: Negative for rash.  Neurological: Negative for dizziness, tremors, speech difficulty and weakness.  Psychiatric/Behavioral: Negative for suicidal ideas, confusion, sleep disturbance, dysphoric mood and agitation. The patient is not nervous/anxious.        Objective:   Physical Exam  Constitutional: He is oriented to person, place, and time. He appears well-developed. No distress.  NAD  HENT:  Mouth/Throat: Oropharynx is clear and moist.  Eyes: Conjunctivae are normal. Pupils are equal, round, and reactive to light.  Neck: Normal range of motion. No JVD present. No thyromegaly present.  Cardiovascular: Normal rate, regular rhythm, normal heart sounds and intact distal pulses.  Exam  reveals no gallop and no friction rub.   No murmur heard. Pulmonary/Chest: Effort normal and breath sounds normal. No respiratory distress. He has no wheezes. He has no rales. He exhibits no tenderness.  Abdominal: Soft. Bowel sounds are normal. He exhibits no distension and no mass. There is no tenderness. There is no rebound and no guarding.  Musculoskeletal: Normal range of motion. He exhibits no edema and no tenderness.  Lymphadenopathy:    He has no cervical adenopathy.  Neurological: He is alert and oriented to person, place, and time. He has normal reflexes. No cranial nerve deficit. He exhibits normal muscle tone. He displays a negative Romberg sign. Coordination and gait normal.  Skin: Skin is warm and dry. No rash noted.  Psychiatric: He has a normal mood and affect. His behavior is normal. Judgment and thought content normal.    Lab Results  Component Value Date   WBC 7.4 01/30/2012   HGB 13.4 04/17/2013   HCT 40.8 04/17/2013   PLT 221.0 01/30/2012   GLUCOSE 93 04/17/2013   CHOL 204* 04/17/2013   TRIG 114.0 04/17/2013   HDL 53.60 04/17/2013   LDLDIRECT 126.7 04/17/2013   ALT 17 04/17/2013   ALT 17 04/17/2013   AST 23 04/17/2013   AST 23 04/17/2013   NA 140 04/17/2013   K 3.7 04/17/2013   CL 104 04/17/2013   CREATININE 1.1 04/17/2013   BUN 11 04/17/2013   CO2 30 04/17/2013   TSH 0.99 03/06/2007   EKG      Assessment & Plan:

## 2014-07-14 ENCOUNTER — Telehealth: Payer: Self-pay | Admitting: Internal Medicine

## 2014-07-14 NOTE — Telephone Encounter (Signed)
Rec'd from [facility] forward 2 pages to Dr. Plotnikov  °

## 2014-08-04 ENCOUNTER — Encounter: Payer: Self-pay | Admitting: Podiatry

## 2014-08-04 ENCOUNTER — Ambulatory Visit (INDEPENDENT_AMBULATORY_CARE_PROVIDER_SITE_OTHER): Payer: Medicare Other | Admitting: Podiatry

## 2014-08-04 VITALS — BP 146/92 | HR 98

## 2014-08-04 DIAGNOSIS — M79606 Pain in leg, unspecified: Secondary | ICD-10-CM

## 2014-08-04 DIAGNOSIS — B351 Tinea unguium: Secondary | ICD-10-CM | POA: Diagnosis not present

## 2014-08-04 NOTE — Progress Notes (Signed)
Subjective:  73 year old male presents requesting toe nails and calluses trimmed. Stated that he has no new problems.   Objective:  Thick dystrophic nails x 10.  Hallux valgus with bunion bilateral.  Hyperextended hallux bilateral with IPJ plantar callus.   Assessment: Onychomycosis x 10.  Callus under left great toe IPJ plantar surface.  Painful feet.  HAV with bunion bilateral.   Plan: Debrided all nails and calluses.  Return as needed.

## 2014-08-04 NOTE — Patient Instructions (Signed)
Seen for hypertrophic nails. All nails debrided. Return in 3 months or as needed.  

## 2014-08-11 ENCOUNTER — Ambulatory Visit (INDEPENDENT_AMBULATORY_CARE_PROVIDER_SITE_OTHER): Payer: 59 | Admitting: Internal Medicine

## 2014-08-11 ENCOUNTER — Encounter: Payer: Self-pay | Admitting: Internal Medicine

## 2014-08-11 VITALS — BP 130/90 | HR 99 | Temp 98.4°F | Wt 201.0 lb

## 2014-08-11 DIAGNOSIS — E785 Hyperlipidemia, unspecified: Secondary | ICD-10-CM

## 2014-08-11 DIAGNOSIS — B351 Tinea unguium: Secondary | ICD-10-CM

## 2014-08-11 DIAGNOSIS — IMO0001 Reserved for inherently not codable concepts without codable children: Secondary | ICD-10-CM

## 2014-08-11 DIAGNOSIS — R030 Elevated blood-pressure reading, without diagnosis of hypertension: Secondary | ICD-10-CM

## 2014-08-11 MED ORDER — VITAMIN D 1000 UNITS PO TABS: 1000.0000 [IU] | ORAL_TABLET | Freq: Every day | ORAL | Status: AC

## 2014-08-11 NOTE — Assessment & Plan Note (Signed)
Resolved on NAS diet Lost wt

## 2014-08-11 NOTE — Progress Notes (Signed)
Pre visit review using our clinic review tool, if applicable. No additional management support is needed unless otherwise documented below in the visit note. 

## 2014-08-11 NOTE — Assessment & Plan Note (Signed)
Off Lovastatin - no more abd pain

## 2014-08-11 NOTE — Assessment & Plan Note (Signed)
Dr Caffie Pinto is trimming toenails q 3 mo

## 2014-08-11 NOTE — Progress Notes (Signed)
   Subjective:     HPI     The patient needs to address  chronic OA; to address chronic  hyperlipidemia controlled with medicines as well; and to address GERD, controlled with medical treatment and diet. BP is better - exercising; feeling well...  BP Readings from Last 3 Encounters:  08/11/14 130/90  08/04/14 146/92  05/11/14 152/80   Wt Readings from Last 3 Encounters:  08/11/14 201 lb (91.173 kg)  05/11/14 210 lb (95.255 kg)  05/01/14 204 lb (92.534 kg)       Review of Systems  Constitutional: Negative for appetite change, fatigue and unexpected weight change.  HENT: Negative for congestion, nosebleeds, sneezing, sore throat and trouble swallowing.   Eyes: Negative for itching and visual disturbance.  Respiratory: Negative for cough.   Cardiovascular: Negative for chest pain, palpitations and leg swelling.  Gastrointestinal: Negative for nausea, diarrhea, blood in stool and abdominal distention.  Genitourinary: Negative for frequency and hematuria.  Musculoskeletal: Positive for arthralgias. Negative for back pain, joint swelling, gait problem and neck pain.  Skin: Negative for rash.  Neurological: Negative for dizziness, tremors, speech difficulty and weakness.  Psychiatric/Behavioral: Negative for suicidal ideas, confusion, sleep disturbance, dysphoric mood and agitation. The patient is not nervous/anxious.        Objective:   Physical Exam  Constitutional: He is oriented to person, place, and time. He appears well-developed. No distress.  NAD  HENT:  Mouth/Throat: Oropharynx is clear and moist.  Eyes: Conjunctivae are normal. Pupils are equal, round, and reactive to light.  Neck: Normal range of motion. No JVD present. No thyromegaly present.  Cardiovascular: Normal rate, regular rhythm, normal heart sounds and intact distal pulses.  Exam reveals no gallop and no friction rub.   No murmur heard. Pulmonary/Chest: Effort normal and breath sounds normal. No  respiratory distress. He has no wheezes. He has no rales. He exhibits no tenderness.  Abdominal: Soft. Bowel sounds are normal. He exhibits no distension and no mass. There is no tenderness. There is no rebound and no guarding.  Musculoskeletal: Normal range of motion. He exhibits no edema or tenderness.  Lymphadenopathy:    He has no cervical adenopathy.  Neurological: He is alert and oriented to person, place, and time. He has normal reflexes. No cranial nerve deficit. He exhibits normal muscle tone. He displays a negative Romberg sign. Coordination and gait normal.  No meningeal signs  Skin: Skin is warm and dry. No rash noted.  Psychiatric: He has a normal mood and affect. His behavior is normal. Judgment and thought content normal.    Lab Results  Component Value Date   WBC 7.4 01/30/2012   HGB 13.4 04/17/2013   HCT 40.8 04/17/2013   PLT 221.0 01/30/2012   GLUCOSE 93 04/17/2013   CHOL 204* 04/17/2013   TRIG 114.0 04/17/2013   HDL 53.60 04/17/2013   LDLDIRECT 126.7 04/17/2013   ALT 17 04/17/2013   ALT 17 04/17/2013   AST 23 04/17/2013   AST 23 04/17/2013   NA 140 04/17/2013   K 3.7 04/17/2013   CL 104 04/17/2013   CREATININE 1.1 04/17/2013   BUN 11 04/17/2013   CO2 30 04/17/2013   TSH 0.99 03/06/2007   EKG      Assessment & Plan:

## 2014-10-23 ENCOUNTER — Inpatient Hospital Stay (HOSPITAL_COMMUNITY)
Admission: RE | Admit: 2014-10-23 | Discharge: 2014-10-23 | Disposition: A | Discharge status: Home / Self Care | Payer: PRIVATE HEALTH INSURANCE | Source: Ambulatory Visit

## 2014-10-27 ENCOUNTER — Ambulatory Visit (INDEPENDENT_AMBULATORY_CARE_PROVIDER_SITE_OTHER): Payer: Medicare Other | Admitting: Podiatry

## 2014-10-27 ENCOUNTER — Encounter: Payer: Self-pay | Admitting: Podiatry

## 2014-10-27 VITALS — BP 141/81 | HR 100

## 2014-10-27 DIAGNOSIS — M79606 Pain in leg, unspecified: Secondary | ICD-10-CM | POA: Diagnosis not present

## 2014-10-27 DIAGNOSIS — B351 Tinea unguium: Secondary | ICD-10-CM

## 2014-10-27 NOTE — Progress Notes (Signed)
Subjective:  74 year old male presents requesting toe nails and calluses trimmed.  Stated that he has no new problems.   Objective:  Thick dystrophic nails x 10.  Plantar IPJ callus under great toe bilateral. Hallux valgus with bunion bilateral.  Hyperextended hallux bilateral with IPJ plantar callus.   Assessment: Onychomycosis x 10.  Callus under left great toe IPJ plantar surface.  Painful feet.  HAV with bunion bilateral.   Plan: Debrided all nails and calluses.  Return as needed.

## 2014-10-27 NOTE — Patient Instructions (Signed)
Seen for hypertrophic nails and calluses. All nails and calluses debrided. Return in 3 months or as needed.  

## 2014-10-28 ENCOUNTER — Encounter (HOSPITAL_COMMUNITY): Payer: Self-pay | Admitting: Physician Assistant

## 2014-10-28 DIAGNOSIS — M1712 Unilateral primary osteoarthritis, left knee: Secondary | ICD-10-CM | POA: Diagnosis present

## 2014-10-28 NOTE — H&P (Signed)
TOTAL KNEE ADMISSION H&P  Patient is being admitted for left total knee arthroplasty.  Subjective:  Chief Complaint:left knee pain.  HPI: Brian Mora, 74 y.o. male, has a history of pain and functional disability in the left knee due to arthritis and has failed non-surgical conservative treatments for greater than 12 weeks to includeNSAID's and/or analgesics, corticosteriod injections, viscosupplementation injections, flexibility and strengthening excercises, use of assistive devices and activity modification.  Onset of symptoms was gradual, starting 10 years ago with gradually worsening course since that time. The patient noted no past surgery on the left knee(s).  Patient currently rates pain in the left knee(s) at 10 out of 10 with activity. Patient has night pain, worsening of pain with activity and weight bearing, pain that interferes with activities of daily living, crepitus and joint swelling.  Patient has evidence of subchondral sclerosis, joint subluxation and joint space narrowing by imaging studies.  There is no active infection.  Patient Active Problem List   Diagnosis Date Noted  . Primary localized osteoarthritis of left knee   . Well adult exam 05/11/2014  . Elevated BP 05/11/2014  . Hyperlipidemia 05/11/2014  . Pain in lower limb 10/28/2013  . Pain in joint, ankle and foot 04/30/2013  . Onychomycosis 01/28/2013  . Callus of foot 01/28/2013  . GASTRIC OUTLET OBSTRUCTION 05/23/2008  . HIATAL HERNIA 05/23/2008  . Esophageal reflux 05/21/2008  . OSTEOARTHRITIS, KNEE, LEFT 05/21/2008  . DYSPEPSIA 01/29/2008   Past Medical History  Diagnosis Date  . 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 Other esophagitis     erosive esophagitis  . Acquired hypertrophic pyloric stenosis     gastric outlet obstruction  . Hyperlipidemia   . Dyspepsia and other specified disorders of function of stomach   . Primary localized osteoarthritis of  left knee     Past Surgical History  Procedure Laterality Date  . Hernia repair    . Bunionectomy Right 1996  . Circumcision  1996    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    No prescriptions prior to admission   Allergies  Allergen Reactions  . Lovastatin     Gas, abd pain    History  Substance Use Topics  . Smoking status: Former Smoker    Quit date: 01/29/1993  . Smokeless tobacco: Never Used     Comment: Quit in 1989  . Alcohol Use: No    Family History  Problem Relation Age of Onset  . Heart disease Mother   . Stroke Mother   . Benign prostatic hyperplasia Father   . Alzheimer's disease Father   . Diabetes Neg Hx   . COPD Neg Hx   . Cancer Sister      Review of Systems  Constitutional: Negative.   HENT: Negative.   Eyes: Negative.   Respiratory: Negative.   Cardiovascular: Negative.   Gastrointestinal: Negative.   Genitourinary: Negative.   Musculoskeletal: Positive for back pain and joint pain.  Skin: Negative.   Neurological: Negative.   Endo/Heme/Allergies: Positive for environmental allergies. Negative for polydipsia. Does not bruise/bleed easily.  Psychiatric/Behavioral: Negative.     Objective:  Physical Exam  Constitutional: He is oriented to person, place, and time. He appears well-developed and well-nourished.  HENT:  Head: Normocephalic and atraumatic.  Mouth/Throat: Oropharynx is clear and moist.  Eyes: Conjunctivae and EOM are normal. Pupils are equal, round, and reactive to light.  Neck: Neck supple.  Cardiovascular: Normal rate, regular rhythm and  normal heart sounds.   Respiratory: Effort normal and breath sounds normal.  GI: Soft.  Genitourinary:  Not pertinent to current symptomatology therefore not examined.  Musculoskeletal:  Examination of his left knee reveals significant valgus deformity, 2 to 3+ effusion, range of motion -5 to 125 degrees knee is stable with normal patella tracking and diffuse pain. Exam of the right knee  reveals full range of motion without pain swelling weakness or instability. Vascular exam: pulses 2+ and symmetric.  Neurological: He is alert and oriented to person, place, and time.  Skin: Skin is warm and dry.  Psychiatric: He has a normal mood and affect. His behavior is normal.    Vital signs in last 24 hours:    Labs:   Estimated body mass index is 25.80 kg/(m^2) as calculated from the following:   Height as of 05/11/14: 6\' 2"  (1.88 m).   Weight as of 08/11/14: 91.173 kg (201 lb).   Imaging Review Plain radiographs demonstrate severe degenerative joint disease of the left knee(s). The overall alignment issignificant valgus. The bone quality appears to be good for age and reported activity level.  Assessment/Plan:  End stage arthritis, left knee  Active Problems:   Primary localized osteoarthritis of left knee   The patient history, physical examination, clinical judgment of the provider and imaging studies are consistent with end stage degenerative joint disease of the left knee(s) and total knee arthroplasty is deemed medically necessary. The treatment options including medical management, injection therapy arthroscopy and arthroplasty were discussed at length. The risks and benefits of total knee arthroplasty were presented and reviewed. The risks due to aseptic loosening, infection, stiffness, patella tracking problems, thromboembolic complications and other imponderables were discussed. The patient acknowledged the explanation, agreed to proceed with the plan and consent was signed. Patient is being admitted for inpatient treatment for surgery, pain control, PT, OT, prophylactic antibiotics, VTE prophylaxis, progressive ambulation and ADL's and discharge planning. The patient is planning to be discharged home with home health services with his daughter  [name] A. [name] Physician Assistant [facility] [phone]  10/28/2014, 3:55 PM

## 2014-10-28 NOTE — Pre-Procedure Instructions (Addendum)
Brian Mora  10/28/2014   Your procedure is scheduled on:  11/09/14   Monday   Report to [facility] short stay admitting at 1040 AM.   Call this number if you have problems the morning of surgery: [phone]   Remember:   Do not eat food or drink liquids after midnight.    Take these medicines the morning of surgery with A SIP OF WATER: nexium     STOP all herbel meds, nsaids (aleve,naproxen,advil,ibuprofen) 5 days prior to surgery starting 11/04/14 including aspirin, vitamins    Do not wear jewelry, make-up or nail polish.  Do not wear lotions, powders, or perfumes. You may not wear deodorant.  Do not shave 48 hours prior to surgery. Men may shave face and neck.  Do not bring valuables to the hospital.  [facility] is not responsible  for any belongings or valuables.               Contacts, dentures or bridgework may not be worn into surgery.   Leave suitcase in the car. After surgery it may be brought to your room.  For patients admitted to the hospital, discharge time is determined by your  treatment team.               Patients discharged the day of surgery will not be allowed to drive home.  Name and phone number of your driver:   Special Instructions:  Special Instructions: [facility] - Preparing for Surgery  Before surgery, you can play an important role.  Because skin is not sterile, your skin needs to be as free of germs as possible.  You can reduce the number of germs on you skin by washing with CHG (chlorahexidine gluconate) soap before surgery.  CHG is an antiseptic cleaner which kills germs and bonds with the skin to continue killing germs even after washing.  Please DO NOT use if you have an allergy to CHG or antibacterial soaps.  If your skin becomes reddened/irritated stop using the CHG and inform your nurse when you arrive at Short Stay.  Do not shave (including legs and underarms) for at least 48 hours prior to the first CHG shower.  You may shave your  face.  Please follow these instructions carefully:   1.  Shower with CHG Soap the night before surgery and the morning of Surgery.  2.  If you choose to wash your hair, wash your hair first as usual with your normal shampoo.  3.  After you shampoo, rinse your hair and body thoroughly to remove the Shampoo.  4.  Use CHG as you would any other liquid soap.  You can apply chg directly  to the skin and wash gently with scrungie or a clean washcloth.  5.  Apply the CHG Soap to your body ONLY FROM THE NECK DOWN.  Do not use on open wounds or open sores.  Avoid contact with your eyes ears, mouth and genitals (private parts).  Wash genitals (private parts)       with your normal soap.  6.  Wash thoroughly, paying special attention to the area where your surgery will be performed.  7.  Thoroughly rinse your body with warm water from the neck down.  8.  DO NOT shower/wash with your normal soap after using and rinsing off the CHG Soap.  9.  Pat yourself dry with a clean towel.            10.  Wear clean  pajamas.            11.  Place clean sheets on your bed the night of your first shower and do not sleep with pets.  Day of Surgery  Do not apply any lotions/deodorants the morning of surgery.  Please wear clean clothes to the hospital/surgery center.   Please read over the following fact sheets that you were given: Pain Booklet, Coughing and Deep Breathing, Blood Transfusion Information, Total Joint Packet, MRSA Information and Surgical Site Infection Prevention

## 2014-10-29 ENCOUNTER — Encounter (HOSPITAL_COMMUNITY)
Admission: RE | Admit: 2014-10-29 | Discharge: 2014-10-29 | Disposition: A | Discharge status: Home / Self Care | Payer: Medicare Other | Source: Ambulatory Visit | Attending: Physician Assistant | Admitting: Physician Assistant

## 2014-10-29 ENCOUNTER — Encounter (HOSPITAL_COMMUNITY)
Admission: RE | Admit: 2014-10-29 | Discharge: 2014-10-29 | Disposition: A | Discharge status: Home / Self Care | Payer: Medicare Other | Source: Ambulatory Visit | Attending: Orthopedic Surgery | Admitting: Orthopedic Surgery

## 2014-10-29 ENCOUNTER — Encounter (HOSPITAL_COMMUNITY): Payer: Self-pay

## 2014-10-29 DIAGNOSIS — R7981 Abnormal blood-gas level: Secondary | ICD-10-CM

## 2014-10-29 HISTORY — DX: Urgency of urination: R39.15

## 2014-10-29 HISTORY — DX: Gastro-esophageal reflux disease without esophagitis: K21.9

## 2014-10-29 LAB — URINALYSIS, ROUTINE W REFLEX MICROSCOPIC
BILIRUBIN URINE: NEGATIVE
Glucose, UA: NEGATIVE mg/dL
Hgb urine dipstick: NEGATIVE
Ketones, ur: 15 mg/dL — AB
Leukocytes, UA: NEGATIVE
NITRITE: NEGATIVE
PROTEIN: NEGATIVE mg/dL
Specific Gravity, Urine: 1.025 (ref 1.005–1.030)
UROBILINOGEN UA: 0.2 mg/dL (ref 0.0–1.0)
pH: 7 (ref 5.0–8.0)

## 2014-10-29 LAB — COMPREHENSIVE METABOLIC PANEL
ALT: 18 U/L (ref 0–53)
ANION GAP: 9 (ref 5–15)
AST: 33 U/L (ref 0–37)
Albumin: 4.1 g/dL (ref 3.5–5.2)
Alkaline Phosphatase: 76 U/L (ref 39–117)
BUN: 11 mg/dL (ref 6–23)
CALCIUM: 9.5 mg/dL (ref 8.4–10.5)
CO2: 29 mmol/L (ref 19–32)
Chloride: 105 mmol/L (ref 96–112)
Creatinine, Ser: 1.13 mg/dL (ref 0.50–1.35)
GFR calc non Af Amer: 63 mL/min — ABNORMAL LOW (ref >90)
GFR, EST AFRICAN AMERICAN: 73 mL/min — AB (ref >90)
Glucose, Bld: 97 mg/dL (ref 70–99)
Potassium: 3.6 mmol/L (ref 3.5–5.1)
Sodium: 143 mmol/L (ref 135–145)
TOTAL PROTEIN: 7.9 g/dL (ref 6.0–8.3)
Total Bilirubin: 1.1 mg/dL (ref 0.3–1.2)

## 2014-10-29 LAB — CBC WITH DIFFERENTIAL/PLATELET
BASOS PCT: 0 % (ref 0–1)
Basophils Absolute: 0 10*3/uL (ref 0.0–0.1)
Eosinophils Absolute: 0.1 10*3/uL (ref 0.0–0.7)
Eosinophils Relative: 1 % (ref 0–5)
HCT: 44 % (ref 39.0–52.0)
HEMOGLOBIN: 14.3 g/dL (ref 13.0–17.0)
LYMPHS PCT: 27 % (ref 12–46)
Lymphs Abs: 1.7 10*3/uL (ref 0.7–4.0)
MCH: 28.5 pg (ref 26.0–34.0)
MCHC: 32.5 g/dL (ref 30.0–36.0)
MCV: 87.6 fL (ref 78.0–100.0)
MONO ABS: 0.4 10*3/uL (ref 0.1–1.0)
MONOS PCT: 6 % (ref 3–12)
Neutro Abs: 4 10*3/uL (ref 1.7–7.7)
Neutrophils Relative %: 66 % (ref 43–77)
Platelets: 218 10*3/uL (ref 150–400)
RBC: 5.02 MIL/uL (ref 4.22–5.81)
RDW: 14.6 % (ref 11.5–15.5)
WBC: 6.2 10*3/uL (ref 4.0–10.5)

## 2014-10-29 LAB — TYPE AND SCREEN
ABO/RH(D): B POS
ANTIBODY SCREEN: NEGATIVE

## 2014-10-29 LAB — SURGICAL PCR SCREEN
MRSA, PCR: NEGATIVE
Staphylococcus aureus: NEGATIVE

## 2014-10-29 LAB — ABO/RH: ABO/RH(D): B POS

## 2014-10-29 LAB — PROTIME-INR
INR: 1.02 (ref 0.00–1.49)
Prothrombin Time: 13.5 seconds (ref 11.6–15.2)

## 2014-10-29 LAB — APTT: aPTT: 25 seconds (ref 24–37)

## 2014-10-30 LAB — URINE CULTURE: Colony Count: 2000

## 2014-11-02 ENCOUNTER — Encounter (HOSPITAL_COMMUNITY): Admission: RE | Payer: Self-pay | Source: Ambulatory Visit

## 2014-11-02 ENCOUNTER — Inpatient Hospital Stay (HOSPITAL_COMMUNITY)
Admission: RE | Admit: 2014-11-02 | Payer: PRIVATE HEALTH INSURANCE | Source: Ambulatory Visit | Admitting: Orthopedic Surgery

## 2014-11-02 SURGERY — ARTHROPLASTY, KNEE, TOTAL
Anesthesia: General | Site: Knee | Laterality: Left

## 2014-11-06 NOTE — Progress Notes (Signed)
Pt notified of time change;to arrive at 0530.pt verbalized understanding

## 2014-11-08 MED ORDER — CEFAZOLIN SODIUM-DEXTROSE 2-3 GM-% IV SOLR
2.0000 g | INTRAVENOUS | Status: AC
  Administered 2014-11-09: 2 g via INTRAVENOUS
  Filled 2014-11-08: qty 50

## 2014-11-09 ENCOUNTER — Inpatient Hospital Stay (HOSPITAL_COMMUNITY)
Admission: RE | Admit: 2014-11-09 | Discharge: 2014-11-10 | DRG: 470 | Disposition: A | Discharge status: Home Health | Payer: Medicare Other | Source: Ambulatory Visit | Attending: Orthopedic Surgery | Admitting: Orthopedic Surgery

## 2014-11-09 ENCOUNTER — Encounter (HOSPITAL_COMMUNITY)
Admission: RE | Disposition: A | Discharge status: Home Health | Payer: Self-pay | Source: Ambulatory Visit | Attending: Orthopedic Surgery

## 2014-11-09 ENCOUNTER — Encounter (HOSPITAL_COMMUNITY): Payer: Self-pay | Admitting: *Deleted

## 2014-11-09 ENCOUNTER — Inpatient Hospital Stay (HOSPITAL_COMMUNITY): Payer: Medicare Other | Admitting: Anesthesiology

## 2014-11-09 DIAGNOSIS — E785 Hyperlipidemia, unspecified: Secondary | ICD-10-CM | POA: Diagnosis present

## 2014-11-09 DIAGNOSIS — Z8249 Family history of ischemic heart disease and other diseases of the circulatory system: Secondary | ICD-10-CM

## 2014-11-09 DIAGNOSIS — M25562 Pain in left knee: Secondary | ICD-10-CM | POA: Diagnosis present

## 2014-11-09 DIAGNOSIS — Z79899 Other long term (current) drug therapy: Secondary | ICD-10-CM | POA: Diagnosis not present

## 2014-11-09 DIAGNOSIS — Z823 Family history of stroke: Secondary | ICD-10-CM | POA: Diagnosis not present

## 2014-11-09 DIAGNOSIS — Z820 Family history of epilepsy and other diseases of the nervous system: Secondary | ICD-10-CM | POA: Diagnosis not present

## 2014-11-09 DIAGNOSIS — M1712 Unilateral primary osteoarthritis, left knee: Secondary | ICD-10-CM | POA: Diagnosis present

## 2014-11-09 DIAGNOSIS — K219 Gastro-esophageal reflux disease without esophagitis: Secondary | ICD-10-CM | POA: Diagnosis present

## 2014-11-09 DIAGNOSIS — Z7982 Long term (current) use of aspirin: Secondary | ICD-10-CM

## 2014-11-09 DIAGNOSIS — Z87891 Personal history of nicotine dependence: Secondary | ICD-10-CM

## 2014-11-09 HISTORY — DX: Unilateral primary osteoarthritis, left knee: M17.12

## 2014-11-09 HISTORY — PX: TOTAL KNEE ARTHROPLASTY: SHX125

## 2014-11-09 SURGERY — ARTHROPLASTY, KNEE, TOTAL
Anesthesia: Monitor Anesthesia Care | Site: Knee | Laterality: Left

## 2014-11-09 MED ORDER — PHENYLEPHRINE 40 MCG/ML (10ML) SYRINGE FOR IV PUSH (FOR BLOOD PRESSURE SUPPORT)
PREFILLED_SYRINGE | INTRAVENOUS | Status: AC
  Filled 2014-11-09: qty 10

## 2014-11-09 MED ORDER — CEFUROXIME SODIUM 1.5 G IJ SOLR
INTRAMUSCULAR | Status: AC
  Filled 2014-11-09: qty 1.5

## 2014-11-09 MED ORDER — ACETAMINOPHEN 650 MG RE SUPP: 650.0000 mg | Freq: Four times a day (QID) | RECTAL | Status: DC | PRN

## 2014-11-09 MED ORDER — DEXAMETHASONE SODIUM PHOSPHATE 10 MG/ML IJ SOLN
10.0000 mg | Freq: Three times a day (TID) | INTRAMUSCULAR | Status: DC
Start: 2014-11-09 — End: 2014-11-10
  Administered 2014-11-09 – 2014-11-10 (×3): 10 mg via INTRAVENOUS
  Filled 2014-11-09 (×3): qty 1

## 2014-11-09 MED ORDER — ROCURONIUM BROMIDE 50 MG/5ML IV SOLN
INTRAVENOUS | Status: AC
  Filled 2014-11-09: qty 1

## 2014-11-09 MED ORDER — PANTOPRAZOLE SODIUM 40 MG PO TBEC
40.0000 mg | DELAYED_RELEASE_TABLET | Freq: Every day | ORAL | Status: DC
  Administered 2014-11-10: 40 mg via ORAL
  Filled 2014-11-09: qty 1

## 2014-11-09 MED ORDER — POLYETHYLENE GLYCOL 3350 17 G PO PACK
17.0000 g | PACK | Freq: Two times a day (BID) | ORAL | Status: DC
  Administered 2014-11-09 – 2014-11-10 (×2): 17 g via ORAL
  Filled 2014-11-09 (×2): qty 1

## 2014-11-09 MED ORDER — PHENYLEPHRINE HCL 10 MG/ML IJ SOLN
INTRAMUSCULAR | Status: AC
  Filled 2014-11-09: qty 1

## 2014-11-09 MED ORDER — DEXAMETHASONE SODIUM PHOSPHATE 10 MG/ML IJ SOLN
INTRAMUSCULAR | Status: DC | PRN
  Administered 2014-11-09: 10 mg via INTRAVENOUS

## 2014-11-09 MED ORDER — ONDANSETRON HCL 4 MG/2ML IJ SOLN
INTRAMUSCULAR | Status: AC
  Filled 2014-11-09: qty 2

## 2014-11-09 MED ORDER — CELECOXIB 200 MG PO CAPS
200.0000 mg | ORAL_CAPSULE | Freq: Two times a day (BID) | ORAL | Status: DC
  Administered 2014-11-09 – 2014-11-10 (×3): 200 mg via ORAL
  Filled 2014-11-09 (×3): qty 1

## 2014-11-09 MED ORDER — MENTHOL 3 MG MT LOZG: 1.0000 | LOZENGE | OROMUCOSAL | Status: DC | PRN

## 2014-11-09 MED ORDER — ASPIRIN EC 325 MG PO TBEC
325.0000 mg | DELAYED_RELEASE_TABLET | Freq: Every day | ORAL | Status: DC
  Administered 2014-11-10: 325 mg via ORAL
  Filled 2014-11-09: qty 1

## 2014-11-09 MED ORDER — POVIDONE-IODINE 7.5 % EX SOLN
Freq: Once | CUTANEOUS | Status: DC
  Filled 2014-11-09: qty 118

## 2014-11-09 MED ORDER — CEFUROXIME SODIUM 1.5 G IJ SOLR
INTRAMUSCULAR | Status: DC | PRN
  Administered 2014-11-09: 1.5 g

## 2014-11-09 MED ORDER — OXYCODONE HCL 5 MG PO TABS
5.0000 mg | ORAL_TABLET | ORAL | Status: DC | PRN
  Administered 2014-11-09 (×2): 10 mg via ORAL
  Administered 2014-11-10: 5 mg via ORAL
  Filled 2014-11-09 (×2): qty 2
  Filled 2014-11-09: qty 1

## 2014-11-09 MED ORDER — FENTANYL CITRATE 0.05 MG/ML IJ SOLN
INTRAMUSCULAR | Status: DC | PRN
  Administered 2014-11-09 (×3): 25 ug via INTRAVENOUS
  Administered 2014-11-09: 50 ug via INTRAVENOUS

## 2014-11-09 MED ORDER — OXYCODONE HCL 5 MG/5ML PO SOLN: 5.0000 mg | Freq: Once | ORAL | Status: DC | PRN

## 2014-11-09 MED ORDER — MIDAZOLAM HCL 2 MG/2ML IJ SOLN
INTRAMUSCULAR | Status: AC
  Filled 2014-11-09: qty 2

## 2014-11-09 MED ORDER — METOCLOPRAMIDE HCL 5 MG/ML IJ SOLN
5.0000 mg | Freq: Three times a day (TID) | INTRAMUSCULAR | Status: DC | PRN
  Filled 2014-11-09: qty 2

## 2014-11-09 MED ORDER — SODIUM CHLORIDE 0.9 % IV SOLN
10.0000 mg | INTRAVENOUS | Status: DC | PRN
  Administered 2014-11-09: 20 ug/min via INTRAVENOUS

## 2014-11-09 MED ORDER — DOCUSATE SODIUM 100 MG PO CAPS
100.0000 mg | ORAL_CAPSULE | Freq: Two times a day (BID) | ORAL | Status: DC
  Administered 2014-11-09 – 2014-11-10 (×3): 100 mg via ORAL
  Filled 2014-11-09 (×3): qty 1

## 2014-11-09 MED ORDER — SUCCINYLCHOLINE CHLORIDE 20 MG/ML IJ SOLN
INTRAMUSCULAR | Status: AC
Start: 2014-11-09 — End: 2014-11-09
  Filled 2014-11-09: qty 1

## 2014-11-09 MED ORDER — FENTANYL CITRATE 0.05 MG/ML IJ SOLN
INTRAMUSCULAR | Status: AC
  Filled 2014-11-09: qty 5

## 2014-11-09 MED ORDER — ONDANSETRON HCL 4 MG/2ML IJ SOLN
4.0000 mg | Freq: Four times a day (QID) | INTRAMUSCULAR | Status: DC | PRN
  Filled 2014-11-09: qty 2

## 2014-11-09 MED ORDER — PROPOFOL 10 MG/ML IV BOLUS
INTRAVENOUS | Status: AC
  Filled 2014-11-09: qty 20

## 2014-11-09 MED ORDER — ALUM & MAG HYDROXIDE-SIMETH 200-200-20 MG/5ML PO SUSP: 30.0000 mL | ORAL | Status: DC | PRN

## 2014-11-09 MED ORDER — DEXAMETHASONE SODIUM PHOSPHATE 10 MG/ML IJ SOLN
INTRAMUSCULAR | Status: AC
  Filled 2014-11-09: qty 1

## 2014-11-09 MED ORDER — SODIUM CHLORIDE 0.9 % IJ SOLN
INTRAMUSCULAR | Status: AC
  Filled 2014-11-09: qty 10

## 2014-11-09 MED ORDER — CEFAZOLIN SODIUM-DEXTROSE 2-3 GM-% IV SOLR
2.0000 g | Freq: Four times a day (QID) | INTRAVENOUS | Status: AC
Start: 2014-11-09 — End: 2014-11-09
  Administered 2014-11-09 (×2): 2 g via INTRAVENOUS
  Filled 2014-11-09 (×2): qty 50

## 2014-11-09 MED ORDER — HYDROMORPHONE HCL 1 MG/ML IJ SOLN: 1.0000 mg | INTRAMUSCULAR | Status: DC | PRN

## 2014-11-09 MED ORDER — SODIUM CHLORIDE 0.9 % IR SOLN
Status: DC | PRN
  Administered 2014-11-09: 3000 mL
  Administered 2014-11-09: 1000 mL

## 2014-11-09 MED ORDER — PHENOL 1.4 % MT LIQD: 1.0000 | OROMUCOSAL | Status: DC | PRN

## 2014-11-09 MED ORDER — VITAMIN D 1000 UNITS PO TABS
1000.0000 [IU] | ORAL_TABLET | Freq: Every day | ORAL | Status: DC
  Administered 2014-11-09 – 2014-11-10 (×2): 1000 [IU] via ORAL
  Filled 2014-11-09 (×2): qty 1

## 2014-11-09 MED ORDER — HYDROMORPHONE HCL 1 MG/ML IJ SOLN: 0.2500 mg | INTRAMUSCULAR | Status: DC | PRN

## 2014-11-09 MED ORDER — DIPHENHYDRAMINE HCL 12.5 MG/5ML PO ELIX: 12.5000 mg | ORAL_SOLUTION | ORAL | Status: DC | PRN

## 2014-11-09 MED ORDER — OXYCODONE HCL 5 MG PO TABS: 5.0000 mg | ORAL_TABLET | Freq: Once | ORAL | Status: DC | PRN

## 2014-11-09 MED ORDER — PROPOFOL INFUSION 10 MG/ML OPTIME
INTRAVENOUS | Status: DC | PRN
  Administered 2014-11-09: 100 ug/kg/min via INTRAVENOUS

## 2014-11-09 MED ORDER — CHLORHEXIDINE GLUCONATE 4 % EX LIQD
60.0000 mL | Freq: Once | CUTANEOUS | Status: DC
  Filled 2014-11-09: qty 60

## 2014-11-09 MED ORDER — PHENYLEPHRINE HCL 10 MG/ML IJ SOLN
INTRAMUSCULAR | Status: DC | PRN
  Administered 2014-11-09 (×5): 120 ug via INTRAVENOUS

## 2014-11-09 MED ORDER — STERILE WATER FOR INJECTION IJ SOLN
INTRAMUSCULAR | Status: AC
  Filled 2014-11-09: qty 10

## 2014-11-09 MED ORDER — ONDANSETRON HCL 4 MG PO TABS
4.0000 mg | ORAL_TABLET | Freq: Four times a day (QID) | ORAL | Status: DC | PRN
  Filled 2014-11-09: qty 1

## 2014-11-09 MED ORDER — PROPOFOL 10 MG/ML IV BOLUS
INTRAVENOUS | Status: DC | PRN
  Administered 2014-11-09: 125 mg via INTRAVENOUS

## 2014-11-09 MED ORDER — METOCLOPRAMIDE HCL 5 MG PO TABS
5.0000 mg | ORAL_TABLET | Freq: Three times a day (TID) | ORAL | Status: DC | PRN
  Filled 2014-11-09: qty 2

## 2014-11-09 MED ORDER — POTASSIUM CHLORIDE IN NACL 20-0.9 MEQ/L-% IV SOLN
INTRAVENOUS | Status: DC
  Administered 2014-11-09 – 2014-11-10 (×2): via INTRAVENOUS
  Filled 2014-11-09 (×5): qty 1000

## 2014-11-09 MED ORDER — ACETAMINOPHEN 325 MG PO TABS
650.0000 mg | ORAL_TABLET | Freq: Four times a day (QID) | ORAL | Status: DC | PRN
Start: 2014-11-09 — End: 2014-11-10

## 2014-11-09 MED ORDER — MIDAZOLAM HCL 5 MG/5ML IJ SOLN
INTRAMUSCULAR | Status: DC | PRN
  Administered 2014-11-09: 2 mg via INTRAVENOUS

## 2014-11-09 MED ORDER — ONDANSETRON HCL 4 MG/2ML IJ SOLN: 4.0000 mg | Freq: Once | INTRAMUSCULAR | Status: DC | PRN

## 2014-11-09 MED ORDER — LORATADINE 10 MG PO TABS: 10.0000 mg | ORAL_TABLET | Freq: Every day | ORAL | Status: DC | PRN

## 2014-11-09 MED ORDER — LIDOCAINE HCL (CARDIAC) 20 MG/ML IV SOLN
INTRAVENOUS | Status: AC
Start: 2014-11-09 — End: 2014-11-09
  Filled 2014-11-09: qty 5

## 2014-11-09 MED ORDER — BUPIVACAINE-EPINEPHRINE (PF) 0.25% -1:200000 IJ SOLN
INTRAMUSCULAR | Status: AC
  Filled 2014-11-09: qty 30

## 2014-11-09 MED ORDER — EPHEDRINE SULFATE 50 MG/ML IJ SOLN
INTRAMUSCULAR | Status: AC
Start: 2014-11-09 — End: 2014-11-09
  Filled 2014-11-09: qty 1

## 2014-11-09 MED ORDER — LACTATED RINGERS IV SOLN: INTRAVENOUS | Status: DC

## 2014-11-09 MED ORDER — LACTATED RINGERS IV SOLN
INTRAVENOUS | Status: DC | PRN
  Administered 2014-11-09 (×3): via INTRAVENOUS

## 2014-11-09 MED ORDER — BUPIVACAINE-EPINEPHRINE 0.25% -1:200000 IJ SOLN
INTRAMUSCULAR | Status: DC | PRN
  Administered 2014-11-09: 30 mL

## 2014-11-09 SURGICAL SUPPLY — 74 items
APL SKNCLS STERI-STRIP NONHPOA (GAUZE/BANDAGES/DRESSINGS) ×1
BANDAGE ESMARK 6X9 LF (GAUZE/BANDAGES/DRESSINGS) ×1 IMPLANT
BENZOIN TINCTURE PRP APPL 2/3 (GAUZE/BANDAGES/DRESSINGS) ×3 IMPLANT
BLADE SAGITTAL 25.0X1.19X90 (BLADE) ×2 IMPLANT
BLADE SAGITTAL 25.0X1.19X90MM (BLADE) ×1
BLADE SAW SGTL 11.0X1.19X90.0M (BLADE) IMPLANT
BLADE SAW SGTL 13.0X1.19X90.0M (BLADE) ×3 IMPLANT
BLADE SURG 10 STRL SS (BLADE) ×6 IMPLANT
BNDG CMPR 9X6 STRL LF SNTH (GAUZE/BANDAGES/DRESSINGS) ×1
BNDG CMPR MED 15X6 ELC VLCR LF (GAUZE/BANDAGES/DRESSINGS) ×1
BNDG ELASTIC 6X15 VLCR STRL LF (GAUZE/BANDAGES/DRESSINGS) ×3 IMPLANT
BNDG ESMARK 6X9 LF (GAUZE/BANDAGES/DRESSINGS) ×3
BOWL SMART MIX CTS (DISPOSABLE) ×3 IMPLANT
CAP KNEE TOTAL 3 SIGMA ×3 IMPLANT
CEMENT HV SMART SET (Cement) ×6 IMPLANT
CLOSURE WOUND 1/2 X4 (GAUZE/BANDAGES/DRESSINGS) ×1
COVER SURGICAL LIGHT HANDLE (MISCELLANEOUS) ×3 IMPLANT
CUFF TOURNIQUET SINGLE 34IN LL (TOURNIQUET CUFF) ×3 IMPLANT
CUFF TOURNIQUET SINGLE 44IN (TOURNIQUET CUFF) IMPLANT
DRAPE EXTREMITY T 121X128X90 (DRAPE) ×3 IMPLANT
DRAPE IMP U-DRAPE 54X76 (DRAPES) ×3 IMPLANT
DRAPE INCISE IOBAN 66X45 STRL (DRAPES) ×3 IMPLANT
DRAPE PROXIMA HALF (DRAPES) ×3 IMPLANT
DRAPE U-SHAPE 47X51 STRL (DRAPES) ×3 IMPLANT
DRSG AQUACEL AG ADV 3.5X14 (GAUZE/BANDAGES/DRESSINGS) ×3 IMPLANT
DRSG PAD ABDOMINAL 8X10 ST (GAUZE/BANDAGES/DRESSINGS) ×6 IMPLANT
DURAPREP 26ML APPLICATOR (WOUND CARE) ×6 IMPLANT
ELECT CAUTERY BLADE 6.4 (BLADE) ×3 IMPLANT
ELECT REM PT RETURN 9FT ADLT (ELECTROSURGICAL) ×3
ELECTRODE REM PT RTRN 9FT ADLT (ELECTROSURGICAL) ×1 IMPLANT
EVACUATOR 1/8 PVC DRAIN (DRAIN) ×3 IMPLANT
FACESHIELD WRAPAROUND (MASK) ×3 IMPLANT
GAUZE SPONGE 4X4 12PLY STRL (GAUZE/BANDAGES/DRESSINGS) ×3 IMPLANT
GLOVE BIO SURGEON STRL SZ7 (GLOVE) ×3 IMPLANT
GLOVE BIOGEL PI IND STRL 7.0 (GLOVE) ×1 IMPLANT
GLOVE BIOGEL PI IND STRL 7.5 (GLOVE) ×1 IMPLANT
GLOVE BIOGEL PI INDICATOR 7.0 (GLOVE) ×2
GLOVE BIOGEL PI INDICATOR 7.5 (GLOVE) ×2
GLOVE SS BIOGEL STRL SZ 7.5 (GLOVE) ×1 IMPLANT
GLOVE SUPERSENSE BIOGEL SZ 7.5 (GLOVE) ×2
GOWN STRL REUS W/ TWL LRG LVL3 (GOWN DISPOSABLE) ×2 IMPLANT
GOWN STRL REUS W/ TWL XL LVL3 (GOWN DISPOSABLE) ×2 IMPLANT
GOWN STRL REUS W/TWL LRG LVL3 (GOWN DISPOSABLE) ×6
GOWN STRL REUS W/TWL XL LVL3 (GOWN DISPOSABLE) ×6
HANDPIECE INTERPULSE COAX TIP (DISPOSABLE) ×3
HOOD PEEL AWAY FACE SHEILD DIS (HOOD) ×9 IMPLANT
IMMOBILIZER KNEE 22 UNIV (SOFTGOODS) ×3 IMPLANT
KIT BASIN OR (CUSTOM PROCEDURE TRAY) ×3 IMPLANT
KIT ROOM TURNOVER OR (KITS) ×3 IMPLANT
MANIFOLD NEPTUNE II (INSTRUMENTS) ×3 IMPLANT
MARKER SKIN DUAL TIP RULER LAB (MISCELLANEOUS) ×3 IMPLANT
NS IRRIG 1000ML POUR BTL (IV SOLUTION) ×3 IMPLANT
PACK TOTAL JOINT (CUSTOM PROCEDURE TRAY) ×3 IMPLANT
PACK UNIVERSAL I (CUSTOM PROCEDURE TRAY) ×3 IMPLANT
PAD ARMBOARD 7.5X6 YLW CONV (MISCELLANEOUS) ×3 IMPLANT
PADDING CAST COTTON 6X4 STRL (CAST SUPPLIES) ×3 IMPLANT
RUBBERBAND STERILE (MISCELLANEOUS) ×3 IMPLANT
SET HNDPC FAN SPRY TIP SCT (DISPOSABLE) ×1 IMPLANT
STRIP CLOSURE SKIN 1/2X4 (GAUZE/BANDAGES/DRESSINGS) ×2 IMPLANT
SUCTION FRAZIER TIP 10 FR DISP (SUCTIONS) ×3 IMPLANT
SUT ETHIBOND NAB CT1 #1 30IN (SUTURE) ×3 IMPLANT
SUT MNCRL AB 3-0 PS2 18 (SUTURE) ×3 IMPLANT
SUT VIC AB 0 CT1 27 (SUTURE) ×6
SUT VIC AB 0 CT1 27XBRD ANBCTR (SUTURE) ×2 IMPLANT
SUT VIC AB 2-0 CT1 27 (SUTURE) ×6
SUT VIC AB 2-0 CT1 TAPERPNT 27 (SUTURE) ×2 IMPLANT
SYR 30ML SLIP (SYRINGE) ×3 IMPLANT
TOWEL OR 17X24 6PK STRL BLUE (TOWEL DISPOSABLE) ×3 IMPLANT
TOWEL OR 17X26 10 PK STRL BLUE (TOWEL DISPOSABLE) ×3 IMPLANT
TRAY FOLEY CATH 16FR SILVER (SET/KITS/TRAYS/PACK) ×3 IMPLANT
TUBE CONNECTING 12'X1/4 (SUCTIONS) ×1
TUBE CONNECTING 12X1/4 (SUCTIONS) ×2 IMPLANT
WATER STERILE IRR 1000ML POUR (IV SOLUTION) IMPLANT
YANKAUER SUCT BULB TIP NO VENT (SUCTIONS) ×3 IMPLANT

## 2014-11-09 NOTE — Anesthesia Preprocedure Evaluation (Addendum)
Anesthesia Evaluation  Patient identified by MRN, date of birth, ID band Patient awake    Reviewed: Allergy & Precautions, NPO status , Patient's Chart, lab work & pertinent test results  Airway Mallampati: II  TM Distance: >3 FB Neck ROM: full    Dental  (+) Teeth Intact, Dental Advidsory Given, Caps   Pulmonary former smoker,  breath sounds clear to auscultation        Cardiovascular Rhythm:regular Rate:Normal     Neuro/Psych  Neuromuscular disease    GI/Hepatic GERD-  ,  Endo/Other    Renal/GU      Musculoskeletal   Abdominal   Peds  Hematology   Anesthesia Other Findings   Reproductive/Obstetrics                            Anesthesia Physical Anesthesia Plan  ASA: II  Anesthesia Plan: MAC and Spinal   Post-op Pain Management:    Induction: Intravenous  Airway Management Planned: Natural Airway and Simple Face Mask  Additional Equipment:   Intra-op Plan:   Post-operative Plan:   Informed Consent:   Dental Advisory Given  Plan Discussed with: Anesthesiologist, CRNA and Surgeon  Anesthesia Plan Comments:        Anesthesia Quick Evaluation

## 2014-11-09 NOTE — Evaluation (Signed)
Physical Therapy Evaluation Patient Details Name: Brian Mora MRN: [id number] DOB: 08-Aug-1940 Today's Date: 11/09/2014   History of Present Illness  Pt is a 74 y/o M s/p L TKA.  Pt's PMH includes esophagitis, acquired hypertrophic pyloric stenosis, hyperlipidemia, urgency or urination, and GERD.  Clinical Impression  Pt is s/p L TKA resulting in the deficits listed below (see PT Problem List). Pt ambulated 40 ft today w/ min guard assist and completed initial PT exercises w/o issue.  Pt will need to attempt stairs tomorrow.  Pt will benefit from skilled PT to increase their independence and safety with mobility to allow discharge to the venue listed below.      Follow Up Recommendations Home health PT;Supervision/Assistance - 24 hour    Equipment Recommendations  Rolling walker with 5" wheels;3in1 (PT)    Recommendations for Other Services       Precautions / Restrictions Precautions Precautions: Fall Restrictions Weight Bearing Restrictions: Yes LLE Weight Bearing: Weight bearing as tolerated      Mobility  Bed Mobility Overal bed mobility: Modified Independent             General bed mobility comments: use of bed rails and increased time.  verbal cues for proper sequencing of BLEs and trunk for reaching sitting EOB  Transfers Overall transfer level: Needs assistance Equipment used: Rolling walker (2 wheeled) Transfers: Sit to/from Stand Sit to Stand: Min guard         General transfer comment: verbal cues to push through bed rather than pulling on RW  Ambulation/Gait Ambulation/Gait assistance: Min guard Ambulation Distance (Feet): 40 Feet Assistive device: Rolling walker (2 wheeled) Gait Pattern/deviations: Step-through pattern;Decreased stride length;Decreased stance time - left;Antalgic;Trunk flexed   Gait velocity interpretation: Below normal speed for age/gender General Gait Details: decreased speed and verbal cues to stand upright, foot flat w/  contact  Stairs            Wheelchair Mobility    Modified Rankin (Stroke Patients Only)       Balance Overall balance assessment: Needs assistance Sitting-balance support: No upper extremity supported;Feet supported Sitting balance-Leahy Scale: Good     Standing balance support: Bilateral upper extremity supported Standing balance-Leahy Scale: Fair                               Pertinent Vitals/Pain Pain Assessment: 0-10 Pain Score: 2  Pain Location: L knee Pain Descriptors / Indicators: Dull;Constant Pain Intervention(s): Limited activity within patient's tolerance;Monitored during session;Repositioned    Home Living Family/patient expects to be discharged to:: Private residence Living Arrangements: Alone Available Help at Discharge: Family;Available 24 hours/day (daughter and son 24/7) Type of Home: House Home Access: Stairs to enter Entrance Stairs-Rails: None Entrance Stairs-Number of Steps: 2 Home Layout: One level;Able to live on main level with bedroom/bathroom Home Equipment: None      Prior Function Level of Independence: Independent               Hand Dominance   Dominant Hand: Right    Extremity/Trunk Assessment               Lower Extremity Assessment: LLE deficits/detail   LLE Deficits / Details: as expected s/p L TKA  Cervical / Trunk Assessment: Normal  Communication   Communication: No difficulties  Cognition Arousal/Alertness: Awake/alert Behavior During Therapy: WFL for tasks assessed/performed Overall Cognitive Status: Within Functional Limits for tasks assessed  General Comments      Exercises Total Joint Exercises Ankle Circles/Pumps: AROM;Both;10 reps;Supine Quad Sets: AROM;Both;10 reps;Supine Heel Slides: AROM;Left;5 reps;Supine Knee Flexion: AROM;Left;5 reps;Seated Goniometric ROM: 8-90      Assessment/Plan    PT Assessment Patient needs continued PT  services  PT Diagnosis Difficulty walking;Abnormality of gait;Generalized weakness;Acute pain   PT Problem List Decreased strength;Decreased range of motion;Decreased activity tolerance;Decreased balance;Decreased mobility;Decreased coordination;Decreased knowledge of use of DME;Decreased safety awareness;Decreased knowledge of precautions;Pain  PT Treatment Interventions DME instruction;Gait training;Stair training;Functional mobility training;Therapeutic activities;Therapeutic exercise;Balance training;Neuromuscular re-education;Patient/family education;Modalities   PT Goals (Current goals can be found in the Care Plan section) Acute Rehab PT Goals Patient Stated Goal: to go home PT Goal Formulation: With patient Time For Goal Achievement: 11/16/14 Potential to Achieve Goals: Good    Frequency 7X/week   Barriers to discharge Inaccessible home environment 2 stairs to enter    Co-evaluation               End of Session Equipment Utilized During Treatment: Gait belt Activity Tolerance: Patient tolerated treatment well Patient left: in bed;with call bell/phone within reach;with family/visitor present;with SCD's reapplied Nurse Communication: Mobility status;Precautions;Weight bearing status         Time: 8242-3536 PT Time Calculation (min) (ACUTE ONLY): 40 min   Charges:   PT Evaluation $Initial PT Evaluation Tier I: 1 Procedure PT Treatments $Gait Training: 8-22 mins $Therapeutic Exercise: 8-22 mins   PT G CodesJoslyn Hy PT, [place] [phone]  [phone] 11/09/2014, 4:25 PM

## 2014-11-09 NOTE — Progress Notes (Signed)
Utilization review completed.  

## 2014-11-09 NOTE — Anesthesia Procedure Notes (Addendum)
Procedure Name: MAC Date/Time: 11/09/2014 7:22 AM Performed by: Neldon Newport Pre-anesthesia Checklist: Patient being monitored, Emergency Drugs available, Patient identified, Timeout performed and Suction available Oxygen Delivery Method: Nasal cannula Placement Confirmation: positive ETCO2 Dental Injury: Teeth and Oropharynx as per pre-operative assessment     Procedure Name: LMA Insertion Date/Time: 11/09/2014 8:02 AM Performed by: Neldon Newport Pre-anesthesia Checklist: Patient being monitored, Suction available, Emergency Drugs available, Patient identified and Timeout performed Patient Re-evaluated:Patient Re-evaluated prior to inductionOxygen Delivery Method: Circle system utilized Preoxygenation: Pre-oxygenation with 100% oxygen Intubation Type: IV induction Ventilation: Mask ventilation without difficulty LMA: LMA inserted LMA Size: 4.0 Number of attempts: 1 Placement Confirmation: positive ETCO2,  ETT inserted through vocal cords under direct vision and breath sounds checked- equal and bilateral Tube secured with: Tape Dental Injury: Teeth and Oropharynx as per pre-operative assessment     Spinal  Start time: 11/09/2014 7:25 AM End time: 11/09/2014 7:30 AM Staffing Performed by: anesthesiologist  Spinal Block Patient position: left lateral decubitus Prep: ChloraPrep Patient monitoring: heart rate, cardiac monitor, continuous pulse ox and blood pressure Approach: right paramedian Location: L3-4 Injection technique: single-shot Needle Needle type: Quincke  Needle gauge: 22 G Needle length: 9 cm Additional Notes 10 mg 0.75% Bupivacaine injected easily

## 2014-11-09 NOTE — Op Note (Signed)
MRN:     [id number] DOB/AGE:    January 05, 1941 / 74 y.o.       OPERATIVE REPORT    DATE OF PROCEDURE:  11/09/2014       PREOPERATIVE DIAGNOSIS:   Primary localized OA left knee      Estimated body mass index is 25.03 kg/(m^2) as calculated from the following:   Height as of 10/29/14: 6\' 2"  (1.88 m).   Weight as of this encounter: 88.451 kg (195 lb).                                                        POSTOPERATIVE DIAGNOSIS:   same                                                                   PROCEDURE:  Procedure(s): TOTAL KNEE ARTHROPLASTY Using Depuy Sigma RP implants #3 Femur, #5Tibia, 32mm sigma RP bearing, 35 Patella     SURGEON: Nakhi Choi A    ASSISTANT:  Kirstin Shepperson PA-C   (Present and scrubbed throughout the case, critical for assistance with exposure, retraction, instrumentation, and closure.)         ANESTHESIA: GET with Femoral Nerve Block  DRAINS: foley, 2 medium hemovac in knee   TOURNIQUET TIME: 94WNI   COMPLICATIONS:  None     SPECIMENS: None   INDICATIONS FOR PROCEDURE: The patient has  djd left knee, varus deformities, XR shows bone on bone arthritis. Patient has failed all conservative measures including anti-inflammatory medicines, narcotics, attempts at  exercise and weight loss, cortisone injections and viscosupplementation.  Risks and benefits of surgery have been discussed, questions answered.   DESCRIPTION OF PROCEDURE: The patient identified by armband, received  right femoral nerve block and IV antibiotics, in the holding area at [facility]. Patient taken to the operating room, appropriate anesthetic  monitors were attached General endotracheal anesthesia induced with  the patient in supine position, Foley catheter was inserted. Tourniquet  applied high to the operative thigh. Lateral post and foot positioner  applied to the table, the lower extremity was then prepped and draped  in usual sterile fashion from the ankle to the  tourniquet. Time-out procedure was performed. The limb was wrapped with an Esmarch bandage and the tourniquet inflated to 365 mmHg. We began the operation by making the anterior midline incision starting at handbreadth above the patella going over the patella 1 cm medial to and  4 cm distal to the tibial tubercle. Small bleeders in the skin and the  subcutaneous tissue identified and cauterized. Transverse retinaculum was incised and reflected medially and a medial parapatellar arthrotomy was accomplished. the patella was everted and theprepatellar fat pad resected. The superficial medial collateral  ligament was then elevated from anterior to posterior along the proximal  flare of the tibia and anterior half of the menisci resected. The knee was hyperflexed exposing bone on bone arthritis. Peripheral and notch osteophytes as well as the cruciate ligaments were then resected. We continued to  work our way around posteriorly along the proximal tibia, and externally  rotated the tibia  subluxing it out from underneath the femur. A McHale  retractor was placed through the notch and a lateral Hohmann retractor  placed, and we then drilled through the proximal tibia in line with the  axis of the tibia followed by an intramedullary guide rod and 2-degree  posterior slope cutting guide. The tibial cutting guide was pinned into place  allowing resection of 8 mm of bone medially and about 2 mm of bone  laterally because of her valgus deformity. Satisfied with the tibial resection, we then  entered the distal femur 2 mm anterior to the PCL origin with the  intramedullary guide rod and applied the distal femoral cutting guide  set at 90mm, with 5 degrees of valgus. This was pinned along the  epicondylar axis. At this point, the distal femoral cut was accomplished without difficulty. We then sized for a #3 femoral component and pinned the guide in 3 degrees of external rotation.The chamfer cutting guide was  pinned into place. The anterior, posterior, and chamfer cuts were accomplished without difficulty followed by  the Sigma RP box cutting guide and the box cut. We also removed posterior osteophytes from the posterior femoral condyles. At this  time, the knee was brought into full extension. We checked our  extension and flexion gaps and found them symmetric at 48mm.  The patella thickness measured at 22 mm. We set the cutting guide at 13 and removed the posterior 9.5-10 mm  of the patella sized for 35 button and drilled the lollipop. The knee  was then once again hyperflexed exposing the proximal tibia. We sized for a #5 tibial base plate, applied the smokestack and the conical reamer followed by the the Delta fin keel punch. We then hammered into place the Sigma RP trial femoral component, inserted a 15-mm trial bearing, trial patellar button, and took the knee through range of motion from 0-130 degrees. No thumb pressure was required for patellar  tracking. At this point, all trial components were removed, a double batch of DePuy HV cement with 1500 mg of Zinacef was mixed and applied to all bony metallic mating surfaces except for the posterior condyles of the femur itself. In order, we  hammered into place the tibial tray and removed excess cement, the femoral component and removed excess cement, a 15-mm Sigma RP bearing  was inserted, and the knee brought to full extension with compression.  The patellar button was clamped into place, and excess cement  removed. While the cement cured the wound was irrigated out with normal saline solution pulse lavage, and medium Hemovac drains were placed.. Ligament stability and patellar tracking were checked and found to be excellent. The tourniquet was then released and hemostasis was obtained with cautery. The parapatellar arthrotomy was closed with  #1 ethibond suture. The subcutaneous tissue with 0 and 2-0 undyed  Vicryl suture, and 4-0 Monocryl.. A dressing  of Xeroform,  4 x 4, dressing sponges, Webril, and Ace wrap applied. Needle and sponge count were correct times 2.The patient awakened, extubated, and taken to recovery room without difficulty. Vascular status was normal, pulses 2+ and symmetric.   Madyson Lukach A 11/09/2014, 9:09 AM

## 2014-11-09 NOTE — Progress Notes (Signed)
Orthopedic Tech Progress Note Patient Details:  Brian Mora September 19, 1940 [id number] CPM applied to LLE with appropriate settings. OHF applied to bed. Footsie roll provided.  CPM Left Knee CPM Left Knee: On Left Knee Flexion (Degrees): 90 Left Knee Extension (Degrees): 0   Asia R Thompson 11/09/2014, 10:48 AM

## 2014-11-09 NOTE — Interval H&P Note (Signed)
History and Physical Interval Note:  11/09/2014 7:07 AM  Brian Mora  has presented today for surgery, with the diagnosis of primary localized OA left knee  The various methods of treatment have been discussed with the patient and family. After consideration of risks, benefits and other options for treatment, the patient has consented to  Procedure(s): TOTAL KNEE ARTHROPLASTY (Left) as a surgical intervention .  The patient's history has been reviewed, patient examined, no change in status, stable for surgery.  I have reviewed the patient's chart and labs.  Questions were answered to the patient's satisfaction.     [name]

## 2014-11-09 NOTE — Anesthesia Postprocedure Evaluation (Signed)
  Anesthesia Post-op Note  Patient: Brian Mora  Procedure(s) Performed: Procedure(s): TOTAL KNEE ARTHROPLASTY (Left)  Patient Location: PACU  Anesthesia Type:General  Level of Consciousness: awake, alert  and oriented  Airway and Oxygen Therapy: Patient Spontanous Breathing and Patient connected to nasal cannula oxygen  Post-op Pain: none  Post-op Assessment: Post-op Vital signs reviewed, Patient's Cardiovascular Status Stable, Respiratory Function Stable, Patent Airway, No signs of Nausea or vomiting and Pain level controlled  Post-op Vital Signs: stable  Last Vitals:  Filed Vitals:   11/09/14 0940  BP: 134/80  Pulse: 74  Temp: 36.2 C  Resp: 15    Complications: No apparent anesthesia complications

## 2014-11-09 NOTE — Transfer of Care (Signed)
Immediate Anesthesia Transfer of Care Note  Patient: Brian Mora  Procedure(s) Performed: Procedure(s): TOTAL KNEE ARTHROPLASTY (Left)  Patient Location: PACU  Anesthesia Type:General  Level of Consciousness: awake and oriented  Airway & Oxygen Therapy: Patient Spontanous Breathing and Patient connected to nasal cannula oxygen  Post-op Assessment: Report given to RN, Post -op Vital signs reviewed and stable and Patient moving all extremities X 4  Post vital signs: Reviewed and stable  Last Vitals:  Filed Vitals:   11/09/14 0547  BP: 144/90  Pulse: 106  Temp: 36.4 C  Resp: 20    Complications: No apparent anesthesia complications

## 2014-11-10 ENCOUNTER — Telehealth: Payer: Self-pay | Admitting: *Deleted

## 2014-11-10 ENCOUNTER — Encounter (HOSPITAL_COMMUNITY): Payer: Self-pay | Admitting: Orthopedic Surgery

## 2014-11-10 LAB — CBC
HCT: 34.5 % — ABNORMAL LOW (ref 39.0–52.0)
Hemoglobin: 11.1 g/dL — ABNORMAL LOW (ref 13.0–17.0)
MCH: 28.2 pg (ref 26.0–34.0)
MCHC: 32.2 g/dL (ref 30.0–36.0)
MCV: 87.6 fL (ref 78.0–100.0)
PLATELETS: 228 10*3/uL (ref 150–400)
RBC: 3.94 MIL/uL — AB (ref 4.22–5.81)
RDW: 14.5 % (ref 11.5–15.5)
WBC: 12.4 10*3/uL — AB (ref 4.0–10.5)

## 2014-11-10 LAB — BASIC METABOLIC PANEL
Anion gap: 5 (ref 5–15)
BUN: 10 mg/dL (ref 6–23)
CO2: 31 mmol/L (ref 19–32)
Calcium: 8.7 mg/dL (ref 8.4–10.5)
Chloride: 105 mmol/L (ref 96–112)
Creatinine, Ser: 1.04 mg/dL (ref 0.50–1.35)
GFR calc Af Amer: 80 mL/min — ABNORMAL LOW (ref >90)
GFR calc non Af Amer: 69 mL/min — ABNORMAL LOW (ref >90)
GLUCOSE: 145 mg/dL — AB (ref 70–99)
POTASSIUM: 4.3 mmol/L (ref 3.5–5.1)
SODIUM: 141 mmol/L (ref 135–145)

## 2014-11-10 MED ORDER — OXYCODONE HCL 5 MG PO TABS: ORAL_TABLET | ORAL | Status: DC

## 2014-11-10 MED ORDER — POLYETHYLENE GLYCOL 3350 17 G PO PACK: 17.0000 g | PACK | Freq: Two times a day (BID) | ORAL | Status: DC

## 2014-11-10 MED ORDER — CELECOXIB 200 MG PO CAPS: ORAL_CAPSULE | ORAL | Status: DC

## 2014-11-10 MED ORDER — DOCUSATE SODIUM 100 MG PO CAPS: 100.0000 mg | ORAL_CAPSULE | Freq: Two times a day (BID) | ORAL | Status: DC

## 2014-11-10 MED ORDER — ASPIRIN 325 MG PO TBEC: DELAYED_RELEASE_TABLET | ORAL | Status: DC

## 2014-11-10 MED ORDER — ACETAMINOPHEN 325 MG PO TABS: 650.0000 mg | ORAL_TABLET | Freq: Four times a day (QID) | ORAL | Status: DC | PRN

## 2014-11-10 NOTE — Progress Notes (Signed)
Physical Therapy Treatment Patient Details Name: Brian Mora MRN: [id number] DOB: Oct 05, 1940 Today's Date: 11/10/2014    History of Present Illness Pt is a 74 y/o M s/p L TKA.  Pt's PMH includes esophagitis, acquired hypertrophic pyloric stenosis, hyperlipidemia, urgency or urination, and GERD.    PT Comments    Pt ambulated 225 ft and ascended/descended 2 steps this session w/ supervision.  Verbal cues for proper technique for toe off while ambulating which pt demonstrated successfully.  Pt continues to progress well w/ therapy and is anticipating d/c to home w/ HHPT today.   Follow Up Recommendations  Home health PT;Supervision/Assistance - 24 hour     Equipment Recommendations  Rolling walker with 5" wheels;3in1 (PT)    Recommendations for Other Services       Precautions / Restrictions Precautions Precautions: Fall Restrictions Weight Bearing Restrictions: Yes LLE Weight Bearing: Weight bearing as tolerated    Mobility  Bed Mobility Overal bed mobility:  (Pt found and left in chair)                Transfers Overall transfer level: Needs assistance Equipment used: Rolling walker (2 wheeled) Transfers: Sit to/from Stand Sit to Stand: Supervision         General transfer comment: verbal cues to push through arm rests  Ambulation/Gait Ambulation/Gait assistance: Supervision Ambulation Distance (Feet): 225 Feet Assistive device: Rolling walker (2 wheeled) Gait Pattern/deviations: Step-through pattern;Decreased stride length;Decreased stance time - left;Decreased dorsiflexion - left;Antalgic   Gait velocity interpretation: Below normal speed for age/gender General Gait Details: verbal cues for proper toe off to increase L knee flexion which pt demonstrated successfully.     Stairs Stairs: Yes Stairs assistance: Supervision Stair Management: No rails;Step to pattern;Backwards;With walker Number of Stairs: 2 General stair comments: PT stabilized RW  anteriorly as pt ascended/descended stairs w/ supervision.  Educated pt on how to explain to family how they will assist him once home and pt verbalized understanding.  Wheelchair Mobility    Modified Rankin (Stroke Patients Only)       Balance Overall balance assessment: Needs assistance Sitting-balance support: No upper extremity supported;Feet supported Sitting balance-Leahy Scale: Good     Standing balance support: Bilateral upper extremity supported;During functional activity Standing balance-Leahy Scale: Fair                      Cognition Arousal/Alertness: Awake/alert Behavior During Therapy: WFL for tasks assessed/performed Overall Cognitive Status: Within Functional Limits for tasks assessed                      Exercises Total Joint Exercises Ankle Circles/Pumps: AROM;Both;10 reps;Seated Straight Leg Raises: AROM;Left;5 reps;Seated Knee Flexion: AROM;Left;5 reps;Seated Goniometric ROM: 7-86    General Comments        Pertinent Vitals/Pain Pain Assessment: No/denies pain    Home Living                      Prior Function            PT Goals (current goals can now be found in the care plan section) Acute Rehab PT Goals Patient Stated Goal: to go home PT Goal Formulation: With patient Time For Goal Achievement: 11/16/14 Potential to Achieve Goals: Good Progress towards PT goals: Progressing toward goals    Frequency  7X/week    PT Plan Current plan remains appropriate    Co-evaluation  End of Session Equipment Utilized During Treatment: Gait belt Activity Tolerance: Patient tolerated treatment well;No increased pain Patient left: in chair;with call bell/phone within reach     Time: 0901-0924 PT Time Calculation (min) (ACUTE ONLY): 23 min  Charges:  $Gait Training: 23-37 mins                    G CodesJoslyn Hy PT, [place] [phone]  [phone] 11/10/2014, 11:45 AM

## 2014-11-10 NOTE — Discharge Summary (Signed)
Patient ID: Brian Mora MRN: [id number] DOB/AGE: 04/07/1941 74 y.o.  Admit date: 11/09/2014 Discharge date: 11/10/2014  Admission Diagnoses:  Principal Problem:   Primary localized osteoarthritis of left knee Active Problems:   Esophageal reflux   DJD (degenerative joint disease) of knee   Discharge Diagnoses:  Same  Past Medical History  Diagnosis Date  . 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 Other esophagitis     erosive esophagitis  . Acquired hypertrophic pyloric stenosis     gastric outlet obstruction  . Hyperlipidemia   . Dyspepsia and other specified disorders of function of stomach   . Primary localized osteoarthritis of left knee   . Urgency of urination   . GERD (gastroesophageal reflux disease)     nexium   as needed    Surgeries: Procedure(s): TOTAL KNEE ARTHROPLASTY on 11/09/2014   Consultants:    Discharged Condition: Improved  Hospital Course: Brian Mora is an 74 y.o. male who was admitted 11/09/2014 for operative treatment ofPrimary localized osteoarthritis of left knee. Patient has severe unremitting pain that affects sleep, daily activities, and work/hobbies. After pre-op clearance the patient was taken to the operating room on 11/09/2014 and underwent  Procedure(s): TOTAL KNEE ARTHROPLASTY.    Patient was given perioperative antibiotics: Anti-infectives    Start     Dose/Rate Route Frequency Ordered Stop   11/09/14 1300  ceFAZolin (ANCEF) IVPB 2 g/50 mL premix     2 g 100 mL/hr over 30 Minutes Intravenous Every 6 hours 11/09/14 1127 11/09/14 2307   11/09/14 0848  cefUROXime (ZINACEF) injection  Status:  Discontinued       As needed 11/09/14 0849 11/09/14 0934   11/09/14 0600  ceFAZolin (ANCEF) IVPB 2 g/50 mL premix     2 g 100 mL/hr over 30 Minutes Intravenous On call to O.R. 11/08/14 1500 11/09/14 0735       Patient was given sequential compression devices, early ambulation, and chemoprophylaxis  to prevent DVT.  Patient benefited maximally from hospital stay and there were no complications.    Recent vital signs: Patient Vitals for the past 24 hrs:  BP Temp Temp src Pulse Resp SpO2  11/10/14 0800 - - - - 18 100 %  11/10/14 0528 132/84 mmHg 97.6 F (36.4 C) Oral 67 18 100 %  11/10/14 0400 - - - - 18 98 %  11/10/14 0129 125/79 mmHg 98.4 F (36.9 C) - 62 18 99 %  11/10/14 0000 - - - - 18 99 %  11/09/14 2006 116/74 mmHg 98.3 F (36.8 C) Oral 79 18 100 %  11/09/14 2000 - - - - 18 99 %  11/09/14 1500 128/76 mmHg 98.6 F (37 C) - (!) 103 16 97 %     Recent laboratory studies:  Recent Labs  11/10/14 0738  WBC 12.4*  HGB 11.1*  HCT 34.5*  PLT 228  NA 141  K 4.3  CL 105  CO2 31  BUN 10  CREATININE 1.04  GLUCOSE 145*  CALCIUM 8.7     Discharge Medications:     Medication List    TAKE these medications        acetaminophen 325 MG tablet  Commonly known as:  TYLENOL  Take 2 tablets (650 mg total) by mouth every 6 (six) hours as needed for mild pain (or Fever >/= 101).     aspirin 325 MG EC tablet  1 tab a day for the next 30  days to prevent blood clots     celecoxib 200 MG capsule  Commonly known as:  CELEBREX  1 tab po q day with food for pain and  swelling     cholecalciferol 1000 UNITS tablet  Commonly known as:  VITAMIN D  Take 1 tablet (1,000 Units total) by mouth daily.     docusate sodium 100 MG capsule  Commonly known as:  COLACE  Take 1 capsule (100 mg total) by mouth 2 (two) times daily.     esomeprazole 20 MG capsule  Commonly known as:  NEXIUM  Take 20 mg by mouth daily at 12 noon.     GAS-X PO  Take 1 tablet by mouth as needed.     loratadine 10 MG tablet  Commonly known as:  CLARITIN  Take 10 mg by mouth daily as needed for allergies.     OVER THE COUNTER MEDICATION  1 tablet. OTC antacid from [employer]     oxyCODONE 5 MG immediate release tablet  Commonly known as:  Oxy IR/ROXICODONE  1-2 tablets every 4-6 hrs as needed for pain      polyethylene glycol packet  Commonly known as:  MIRALAX / GLYCOLAX  Take 17 g by mouth 2 (two) times daily.        Diagnostic Studies: Dg Chest 2 View  10/29/2014   CLINICAL DATA:  Arthroplasty.  Low O2 sat.  EXAM: CHEST  2 VIEW  COMPARISON:  None.  FINDINGS: The heart size and mediastinal contours are within normal limits. Minimal basilar subsegmental atelectasis. Scoliosis thoracic spine. No acute bony abnormality.  IMPRESSION: Minimal basilar subsegmental atelectasis, otherwise negative exam .   Electronically Signed   By: Marcello Moores  Register   On: 10/29/2014 12:35    Disposition: Final discharge disposition not confirmed      Discharge Instructions    CPM    Complete by:  As directed   Continuous passive motion machine (CPM):      Use the CPM from 0 to 90 for 6 hours per day.       You may break it up into 2 or 3 sessions per day.      Use CPM for 2 weeks or until you are told to stop.     Call MD / Call 911    Complete by:  As directed   If you experience chest pain or shortness of breath, CALL 911 and be transported to the hospital emergency room.  If you develope a fever above 101 F, pus (white drainage) or increased drainage or redness at the wound, or calf pain, call your surgeon's office.     Change dressing    Complete by:  As directed   Change the gauze dressing daily with sterile 4 x 4 inch gauze and apply TED hose.  DO NOT REMOVE BANDAGE OVER SURGICAL INCISION.  Fate WHOLE LEG INCLUDING OVER THE WATERPROOF BANDAGE WITH SOAP AND WATER EVERY DAY.     Constipation Prevention    Complete by:  As directed   Drink plenty of fluids.  Prune juice may be helpful.  You may use a stool softener, such as Colace (over the counter) 100 mg twice a day.  Use MiraLax (over the counter) for constipation as needed.     Diet - low sodium heart healthy    Complete by:  As directed      Discharge instructions    Complete by:  As directed   [facility]  Remove  items at home which could result in a fall. This includes throw rugs or furniture in walking pathways ICE to the affected joint every three hours while awake for 30 minutes at a time, for at least the first 3-5 days, and then as needed for pain and swelling.  Continue to use ice for pain and swelling. You may notice swelling that will progress down to the foot and ankle.  This is normal after surgery.  Elevate your leg when you are not up walking on it.   Continue to use the breathing machine you got in the hospital (incentive spirometer) which will help keep your temperature down.  It is common for your temperature to cycle up and down following surgery, especially at night when you are not up moving around and exerting yourself.  The breathing machine keeps your lungs expanded and your temperature down.   DIET:  As you were doing prior to hospitalization, we recommend a well-balanced diet.  DRESSING / WOUND CARE / SHOWERING  Keep the surgical dressing until follow up.  The dressing is water proof, so you can shower without any extra covering.  IF THE DRESSING FALLS OFF or the wound gets wet inside, change the dressing with sterile gauze.  Please use good hand washing techniques before changing the dressing.  Do not use any lotions or creams on the incision until instructed by your surgeon.    ACTIVITY  Increase activity slowly as tolerated, but follow the weight bearing instructions below.   No driving for 6 weeks or until further direction given by your physician.  You cannot drive while taking narcotics.  No lifting or carrying greater than 10 lbs. until further directed by your surgeon. Avoid periods of inactivity such as sitting longer than an hour when not asleep. This helps prevent blood clots.  You may return to work once you are authorized by your doctor.     WEIGHT BEARING   Weight bearing as tolerated with assist device (Mora, cane, etc) as directed, use it as long as suggested  by your surgeon or therapist, typically at least 4-6 weeks.   EXERCISES  Results after joint replacement surgery are often greatly improved when you follow the exercise, range of motion and muscle strengthening exercises prescribed by your doctor. Safety measures are also important to protect the joint from further injury. Any time any of these exercises cause you to have increased pain or swelling, decrease what you are doing until you are comfortable again and then slowly increase them. If you have problems or questions, call your caregiver or physical therapist for advice.   Rehabilitation is important following a joint replacement. After just a few days of immobilization, the muscles of the leg can become weakened and shrink (atrophy).  These exercises are designed to build up the tone and strength of the thigh and leg muscles and to improve motion. Often times heat used for twenty to thirty minutes before working out will loosen up your tissues and help with improving the range of motion but do not use heat for the first two weeks following surgery (sometimes heat can increase post-operative swelling).   These exercises can be done on a training (exercise) mat, on the floor, on a table or on a bed. Use whatever works the best and is most comfortable for you.    Use music or television while you are exercising so that the exercises are a pleasant break in your day. This will make your  life better with the exercises acting as a break in your routine that you can look forward to.   Perform all exercises about fifteen times, three times per day or as directed.  You should exercise both the operative leg and the other leg as well.   Exercises include:   Quad Sets - Tighten up the muscle on the front of the thigh (Quad) and hold for 5-10 seconds.   Straight Leg Raises - With your knee straight (if you were given a brace, keep it on), lift the leg to 60 degrees, hold for 3 seconds, and slowly lower the  leg.  Perform this exercise against resistance later as your leg gets stronger.  Leg Slides: Lying on your back, slowly slide your foot toward your buttocks, bending your knee up off the floor (only go as far as is comfortable). Then slowly slide your foot back down until your leg is flat on the floor again.  Angel Wings: Lying on your back spread your legs to the side as far apart as you can without causing discomfort.  Hamstring Strength:  Lying on your back, push your heel against the floor with your leg straight by tightening up the muscles of your buttocks.  Repeat, but this time bend your knee to a comfortable angle, and push your heel against the floor.  You may put a pillow under the heel to make it more comfortable if necessary.   A rehabilitation program following joint replacement surgery can speed recovery and prevent re-injury in the future due to weakened muscles. Contact your doctor or a physical therapist for more information on knee rehabilitation.    CONSTIPATION  Constipation is defined medically as fewer than three stools per week and severe constipation as less than one stool per week.  Even if you have a regular bowel pattern at home, your normal regimen is likely to be disrupted due to multiple reasons following surgery.  Combination of anesthesia, postoperative narcotics, change in appetite and fluid intake all can affect your bowels.   YOU MUST use at least one of the following options; they are listed in order of increasing strength to get the job done.  They are all available over the counter, and you may need to use some, POSSIBLY even all of these options:    Drink plenty of fluids (prune juice may be helpful) and high fiber foods Colace 100 mg by mouth twice a day  Senokot for constipation as directed and as needed Dulcolax (bisacodyl), take with full glass of water  Miralax (polyethylene glycol) once or twice a day as needed.  If you have tried all these things and  are unable to have a bowel movement in the first 3-4 days after surgery call either your surgeon or your primary doctor.    If you experience loose stools or diarrhea, hold the medications until you stool forms back up.  If your symptoms do not get better within 1 week or if they get worse, check with your doctor.  If you experience "the worst abdominal pain ever" or develop nausea or vomiting, please contact the office immediately for further recommendations for treatment.   ITCHING:  If you experience itching with your medications, try taking only a single pain pill, or even half a pain pill at a time.  You can also use Benadryl over the counter for itching or also to help with sleep.   TED HOSE STOCKINGS:  Use stockings on both legs until for at  least 2 weeks or as directed by physician office. They may be removed at night for sleeping.  MEDICATIONS:  See your medication summary on the "After Visit Summary" that nursing will review with you.  You may have some home medications which will be placed on hold until you complete the course of blood thinner medication.  It is important for you to complete the blood thinner medication as prescribed.  PRECAUTIONS:  If you experience chest pain or shortness of breath - call 911 immediately for transfer to the hospital emergency department.   If you develop a fever greater that 101 F, purulent drainage from wound, increased redness or drainage from wound, foul odor from the wound/dressing, or calf pain - CONTACT YOUR SURGEON.                                                   FOLLOW-UP APPOINTMENTS:  If you do not already have a post-op appointment, please call the office for an appointment to be seen by your surgeon.  Guidelines for how soon to be seen are listed in your "After Visit Summary", but are typically between 1-4 weeks after surgery.  OTHER INSTRUCTIONS:   Knee Replacement:  Do not place pillow under knee, focus on keeping the knee straight  while resting. CPM instructions: 0-90 degrees, 2 hours in the morning, 2 hours in the afternoon, and 2 hours in the evening. Place foam block, curve side up under heel at all times except when in CPM or when walking.  DO NOT modify, tear, cut, or change the foam block in any way.  MAKE SURE YOU:  Understand these instructions.  Get help right away if you are not doing well or get worse.    Thank you for letting us be a part of your medical care team.  It is a privilege we respect greatly.  We hope these instructions will help you stay on track for a fast and full recovery!     Do not put a pillow under the knee. Place it under the heel.    Complete by:  As directed   Place gray foam block, curve side up under heel at all times except when in CPM or when walking.  DO NOT modify, tear, cut, or change in any way the gray foam block.     Increase activity slowly as tolerated    Complete by:  As directed      TED hose    Complete by:  As directed   Use stockings (TED hose) for 2 weeks on both leg(s).  You may remove them at night for sleeping.           Follow-up Information    Follow up with Linda Hedges, PA-C On 11/10/2014.   Specialty:  Orthopedic Surgery   Why:  11/23/14 at 3:15 for wound check and xrays   Contact information:   [place] [address] [phone]        Signed: Linda Hedges 11/10/2014, 10:44 AM

## 2014-11-10 NOTE — Discharge Instructions (Signed)

## 2014-11-10 NOTE — Evaluation (Signed)
Occupational Therapy Evaluation Patient Details Name: Brian Mora MRN: [id number] DOB: 03-Apr-1941 Today's Date: 11/10/2014    History of Present Illness Pt is a 74 y/o M s/p L TKA.  Pt's PMH includes esophagitis, acquired hypertrophic pyloric stenosis, hyperlipidemia, urgency or urination, and GERD.   Clinical Impression   Pt is at min A level with LB ADLs and sup with ADL mobility using RW. Pt states that his daughter will be with him 24/76 for assist and sup; pt taking home a RW and 3 in 1 and has a shower seat at home. All education completed and no further acute OT services indicated at this time    Follow Up Recommendations  No OT follow up    Equipment Recommendations  3 in 1 bedside comode    Recommendations for Other Services       Precautions / Restrictions Precautions Precautions: Fall Restrictions Weight Bearing Restrictions: Yes LLE Weight Bearing: Weight bearing as tolerated      Mobility Bed Mobility Overal bed mobility:  (Pt found and left in chair)             General bed mobility comments: pt up in recliner  Transfers Overall transfer level: Needs assistance Equipment used: Rolling walker (2 wheeled) Transfers: Sit to/from Stand Sit to Stand: Supervision         General transfer comment: verbal cues to push through arm rests    Balance Overall balance assessment: Needs assistance Sitting-balance support: No upper extremity supported;Feet supported Sitting balance-Leahy Scale: Good     Standing balance support: During functional activity;Single extremity supported;Bilateral upper extremity supported Standing balance-Leahy Scale: Fair                              ADL Overall ADL's : Needs assistance/impaired     Grooming: Wash/dry hands;Wash/dry face;Standing;Min guard   Upper Body Bathing: Set up;Sitting   Lower Body Bathing: Minimal assistance   Upper Body Dressing : Set up;Sitting   Lower Body Dressing:  Minimal assistance   Toilet Transfer: Supervision/safety;RW;Comfort height toilet;Grab bars   Toileting- Clothing Manipulation and Hygiene: Min guard;Sit to/from stand   Tub/ Banker: Walk-in shower;Shower seat;Supervision/safety;Grab bars;Rolling walker   Functional mobility during ADLs: Supervision/safety General ADL Comments: pt will go home with RW and 3 in 1, has seat in shower at home. Pt states that his daughter will be with him 24/7     Vision  wears glasses at all times, no change from baseline   Perception Perception Perception Tested?: No   Praxis  N/T    Pertinent Vitals/Pain Pain Assessment: 0-10 Pain Score: 1  Pain Location: L knee Pain Descriptors / Indicators: Sore Pain Intervention(s): Monitored during session;Repositioned     Hand Dominance Right   Extremity/Trunk Assessment Upper Extremity Assessment Upper Extremity Assessment: Overall WFL for tasks assessed           Communication Communication Communication: No difficulties   Cognition Arousal/Alertness: Awake/alert Behavior During Therapy: WFL for tasks assessed/performed Overall Cognitive Status: Within Functional Limits for tasks assessed                     General Comments   pt pleasant and cooperative, very talkative             Home Living Family/patient expects to be discharged to:: Private residence Living Arrangements: Alone Available Help at Discharge: Family;Available 24 hours/day Type of Home: House Home Access: Stairs  to enter Entrance Stairs-Number of Steps: 2 Entrance Stairs-Rails: None Home Layout: One level;Able to live on main level with bedroom/bathroom     Bathroom Shower/Tub: Walk-in shower;Tub/shower unit   [employer]: Standard     Home Equipment: Shower seat - built in          Prior Functioning/Environment Level of Independence: Independent             OT Diagnosis: Acute pain   OT Problem List: Pain;Impaired balance  (sitting and/or standing);Decreased knowledge of use of DME or AE   OT Treatment/Interventions:      OT Goals(Current goals can be found in the care plan section) Acute Rehab OT Goals Patient Stated Goal: to go home  OT Frequency:     Barriers to D/C:  none                        End of Session Equipment Utilized During Treatment: Rolling walker;Other (comment) (3 in 1) CPM Left Knee CPM Left Knee: Off  Activity Tolerance: Patient tolerated treatment well;No increased pain Patient left: in chair;with call bell/phone within reach   Time: 1235-1256 OT Time Calculation (min): 21 min Charges:  OT General Charges $OT Visit: 1 Procedure OT Evaluation $Initial OT Evaluation Tier I: 1 Procedure OT Treatments $Therapeutic Activity: 8-22 mins G-Codes:    Britt Bottom 11/10/2014, 2:32 PM

## 2014-11-10 NOTE — Telephone Encounter (Signed)
Pt was on tcm list d/c 11/10/14 had (L) total knee replacement. Will f.u with Orthopedic surgery 11/23/14 seeing Luna Glasgow, PA.../lmb

## 2014-11-10 NOTE — Plan of Care (Signed)
Problem: Consults Goal: Diagnosis- Total Joint Replacement Outcome: Completed/Met Date Met:  11/10/14 Primary Total Knee  Left

## 2014-11-10 NOTE — Care Management Note (Signed)
CARE MANAGEMENT NOTE 11/10/2014  Patient:  Brian Mora, Brian Mora   Account Number:  [id number]  Date Initiated:  11/10/2014  Documentation initiated by:  Ricki Miller  Subjective/Objective Assessment:   74 yr old male admitted with DJD of left knee. Patient underwent a left total knee arthroplasty.     Action/Plan:   Case manager spoke with patient concerning home health and DME. Patient was preoperatively setup with [facility], no changes. has family support at discharge.   Anticipated DC Date:  11/10/2014   Anticipated DC Plan:  [place]  CM consult      [facility] Choice  HOME HEALTH  DURABLE MEDICAL EQUIPMENT   Choice offered to / List presented to:  C-1 Patient   DME arranged  [facility]  3-N-1  CPM      DME agency  [employer]     [place] arranged  HH-2 PT      [place].   Status of service:  Completed, signed off Medicare Important Message given?  NA - LOS <3 / Initial given by admissions (If response is "NO", the following Medicare IM given date fields will be blank) Date Medicare IM given:   Medicare IM given by:   Date Additional Medicare IM given:   Additional Medicare IM given by:    Discharge Disposition:  [place]  Per UR Regulation:  Reviewed for med. necessity/level of care/duration of stay

## 2014-11-23 ENCOUNTER — Other Ambulatory Visit (HOSPITAL_COMMUNITY): Payer: Self-pay | Admitting: Cardiology

## 2014-11-23 ENCOUNTER — Ambulatory Visit (HOSPITAL_COMMUNITY): Payer: Medicare Other | Attending: Orthopedic Surgery | Admitting: Cardiology

## 2014-11-23 DIAGNOSIS — M7989 Other specified soft tissue disorders: Secondary | ICD-10-CM

## 2014-11-23 DIAGNOSIS — M79605 Pain in left leg: Secondary | ICD-10-CM | POA: Diagnosis not present

## 2014-11-23 NOTE — Progress Notes (Signed)
Left lower venous duplex performed  

## 2015-01-27 ENCOUNTER — Encounter: Payer: Self-pay | Admitting: Podiatry

## 2015-01-27 ENCOUNTER — Ambulatory Visit (INDEPENDENT_AMBULATORY_CARE_PROVIDER_SITE_OTHER): Payer: Medicare Other | Admitting: Podiatry

## 2015-01-27 VITALS — BP 159/91 | HR 95

## 2015-01-27 DIAGNOSIS — B351 Tinea unguium: Secondary | ICD-10-CM | POA: Diagnosis not present

## 2015-01-27 DIAGNOSIS — M79606 Pain in leg, unspecified: Secondary | ICD-10-CM | POA: Diagnosis not present

## 2015-01-27 NOTE — Patient Instructions (Signed)
Seen for hypertrophic nails and calluses. All nails and calluses debrided. Return in 3 months or as needed.  

## 2015-01-27 NOTE — Progress Notes (Signed)
Subjective:  74 year old male presents requesting toe nails and calluses trimmed.  Has had left knee surgery and recovering well.   Objective:  Thick dystrophic nails x 10.  Plantar IPJ callus under great toe bilateral. Hallux valgus with bunion bilateral.  Hyperextended hallux bilateral with IPJ plantar callus.   Assessment: Onychomycosis x 10.  Callus under left great toe IPJ plantar surface.  Painful feet.  HAV with bunion bilateral.   Plan: Debrided all nails and calluses.  Return as needed.

## 2015-02-09 ENCOUNTER — Encounter: Payer: Self-pay | Admitting: Internal Medicine

## 2015-02-09 ENCOUNTER — Ambulatory Visit (INDEPENDENT_AMBULATORY_CARE_PROVIDER_SITE_OTHER): Payer: Medicare Other | Admitting: Internal Medicine

## 2015-02-09 VITALS — BP 132/90 | HR 85 | Wt 194.0 lb

## 2015-02-09 DIAGNOSIS — K219 Gastro-esophageal reflux disease without esophagitis: Secondary | ICD-10-CM

## 2015-02-09 DIAGNOSIS — M1712 Unilateral primary osteoarthritis, left knee: Secondary | ICD-10-CM

## 2015-02-09 DIAGNOSIS — R030 Elevated blood-pressure reading, without diagnosis of hypertension: Secondary | ICD-10-CM

## 2015-02-09 DIAGNOSIS — IMO0001 Reserved for inherently not codable concepts without codable children: Secondary | ICD-10-CM

## 2015-02-09 MED ORDER — OXYCODONE HCL 5 MG PO TABS: 5.0000 mg | ORAL_TABLET | Freq: Three times a day (TID) | ORAL | Status: DC | PRN

## 2015-02-09 MED ORDER — OXYCODONE HCL 5 MG PO TABS: ORAL_TABLET | ORAL | Status: DC

## 2015-02-09 MED ORDER — OXYCODONE HCL 5 MG PO TABS
5.0000 mg | ORAL_TABLET | Freq: Three times a day (TID) | ORAL | Status: DC | PRN
Start: 2015-02-09 — End: 2015-02-09

## 2015-02-09 NOTE — Assessment & Plan Note (Signed)
Chronic GERD Prilosec prn

## 2015-02-09 NOTE — Assessment & Plan Note (Signed)
BP Readings from Last 3 Encounters:  02/09/15 132/90  01/27/15 159/91  11/10/14 132/84  NAS diet

## 2015-02-09 NOTE — Progress Notes (Signed)
Subjective:  Patient ID: Brian Mora, male    DOB: [birth year]  Age: 74 y.o. MRN: [id number]  CC: No chief complaint on file.   HPI Brian Mora presents  for a follow-up of  chronic hypertension, chronic dyslipidemia, OA controlled with medicines. He had a L TKR in 4/16    Outpatient Prescriptions Prior to Visit  Medication Sig Dispense Refill  . acetaminophen (TYLENOL) 325 MG tablet Take 2 tablets (650 mg total) by mouth every 6 (six) hours as needed for mild pain (or Fever >/= 101).    Marland Kitchen aspirin EC 325 MG EC tablet 1 tab a day for the next 30 days to prevent blood clots 30 tablet 0  . celecoxib (CELEBREX) 200 MG capsule 1 tab po q day with food for pain and  swelling 30 capsule 0  . cholecalciferol (VITAMIN D) 1000 UNITS tablet Take 1 tablet (1,000 Units total) by mouth daily. 100 tablet 3  . docusate sodium (COLACE) 100 MG capsule Take 1 capsule (100 mg total) by mouth 2 (two) times daily. 10 capsule 0  . esomeprazole (NEXIUM) 20 MG capsule Take 20 mg by mouth daily at 12 noon.    . loratadine (CLARITIN) 10 MG tablet Take 10 mg by mouth daily as needed for allergies.    Marland Kitchen OVER THE COUNTER MEDICATION 1 tablet. OTC antacid from [facility]    . polyethylene glycol (MIRALAX / GLYCOLAX) packet Take 17 g by mouth 2 (two) times daily. 60 each 0  . Simethicone (GAS-X PO) Take 1 tablet by mouth as needed.    Marland Kitchen oxyCODONE (OXY IR/ROXICODONE) 5 MG immediate release tablet 1-2 tablets every 4-6 hrs as needed for pain 100 tablet 0   No facility-administered medications prior to visit.    ROS Review of Systems  Constitutional: Negative for appetite change, fatigue and unexpected weight change.  HENT: Negative for congestion, nosebleeds, sneezing, sore throat and trouble swallowing.   Eyes: Negative for itching and visual disturbance.  Respiratory: Negative for cough.   Cardiovascular: Negative for chest pain, palpitations and leg swelling.  Gastrointestinal: Negative for nausea,  diarrhea, blood in stool and abdominal distention.  Genitourinary: Negative for frequency and hematuria.  Musculoskeletal: Positive for arthralgias. Negative for back pain, joint swelling, gait problem and neck pain.  Skin: Negative for rash.  Neurological: Negative for dizziness, tremors, speech difficulty and weakness.  Psychiatric/Behavioral: Negative for suicidal ideas, sleep disturbance, dysphoric mood and agitation. The patient is not nervous/anxious.     Objective:  BP 132/90 mmHg  Pulse 85  Wt 194 lb (87.998 kg)  SpO2 98%  BP Readings from Last 3 Encounters:  02/09/15 132/90  01/27/15 159/91  11/10/14 132/84    Wt Readings from Last 3 Encounters:  02/09/15 194 lb (87.998 kg)  11/09/14 195 lb (88.451 kg)  10/29/14 195 lb 9.6 oz (88.724 kg)    Physical Exam  Constitutional: He is oriented to person, place, and time. He appears well-developed. No distress.  NAD  HENT:  Mouth/Throat: Oropharynx is clear and moist.  Eyes: Conjunctivae are normal. Pupils are equal, round, and reactive to light.  Neck: Normal range of motion. No JVD present. No thyromegaly present.  Cardiovascular: Normal rate, regular rhythm, normal heart sounds and intact distal pulses.  Exam reveals no gallop and no friction rub.   No murmur heard. Pulmonary/Chest: Effort normal and breath sounds normal. No respiratory distress. He has no wheezes. He has no rales. He exhibits no tenderness.  Abdominal: Soft. Bowel  sounds are normal. He exhibits no distension and no mass. There is no tenderness. There is no rebound and no guarding.  Musculoskeletal: Normal range of motion. He exhibits no edema or tenderness.  Lymphadenopathy:    He has no cervical adenopathy.  Neurological: He is alert and oriented to person, place, and time. He has normal reflexes. No cranial nerve deficit. He exhibits normal muscle tone. He displays a negative Romberg sign. Coordination and gait normal.  Skin: Skin is warm and dry. No  rash noted.  Psychiatric: He has a normal mood and affect. His behavior is normal. Judgment and thought content normal.  L knee is w/a healed scar  Lab Results  Component Value Date   WBC 12.4* 11/10/2014   HGB 11.1* 11/10/2014   HCT 34.5* 11/10/2014   PLT 228 11/10/2014   GLUCOSE 145* 11/10/2014   CHOL 204* 04/17/2013   TRIG 114.0 04/17/2013   HDL 53.60 04/17/2013   LDLDIRECT 126.7 04/17/2013   ALT 18 10/29/2014   AST 33 10/29/2014   NA 141 11/10/2014   K 4.3 11/10/2014   CL 105 11/10/2014   CREATININE 1.04 11/10/2014   BUN 10 11/10/2014   CO2 31 11/10/2014   TSH 0.99 03/06/2007   INR 1.02 10/29/2014    Dg Chest 2 View  10/29/2014   CLINICAL DATA:  Arthroplasty.  Low O2 sat.  EXAM: CHEST  2 VIEW  COMPARISON:  None.  FINDINGS: The heart size and mediastinal contours are within normal limits. Minimal basilar subsegmental atelectasis. Scoliosis thoracic spine. No acute bony abnormality.  IMPRESSION: Minimal basilar subsegmental atelectasis, otherwise negative exam .   Electronically Signed   By: Marcello Moores  Register   On: 10/29/2014 12:35    Assessment & Plan:   Diagnoses and all orders for this visit:  Elevated BP  Primary localized osteoarthritis of left knee  Gastroesophageal reflux disease without esophagitis  Other orders -     Discontinue: oxyCODONE (OXY IR/ROXICODONE) 5 MG immediate release tablet; 1-2 tablets every 4-6 hrs as needed for pain -     Discontinue: oxyCODONE (OXY IR/ROXICODONE) 5 MG immediate release tablet; Take 1 tablet (5 mg total) by mouth every 8 (eight) hours as needed for severe pain. 1-2 tablets every 4-6 hrs as needed for pain -     oxyCODONE (OXY IR/ROXICODONE) 5 MG immediate release tablet; Take 1 tablet (5 mg total) by mouth every 8 (eight) hours as needed for severe pain.   I have discontinued Brian Mora oxyCODONE and oxyCODONE. I have also changed his oxyCODONE. Additionally, I am having him maintain his cholecalciferol, loratadine,  esomeprazole, Simethicone (GAS-X PO), OVER THE COUNTER MEDICATION, acetaminophen, aspirin, celecoxib, docusate sodium, and polyethylene glycol.  Meds ordered this encounter  Medications  . DISCONTD: oxyCODONE (OXY IR/ROXICODONE) 5 MG immediate release tablet    Sig: 1-2 tablets every 4-6 hrs as needed for pain    Dispense:  60 tablet    Refill:  0  . DISCONTD: oxyCODONE (OXY IR/ROXICODONE) 5 MG immediate release tablet    Sig: Take 1 tablet (5 mg total) by mouth every 8 (eight) hours as needed for severe pain. 1-2 tablets every 4-6 hrs as needed for pain    Dispense:  60 tablet    Refill:  0  . oxyCODONE (OXY IR/ROXICODONE) 5 MG immediate release tablet    Sig: Take 1 tablet (5 mg total) by mouth every 8 (eight) hours as needed for severe pain.    Dispense:  60 tablet  Refill:  0     Follow-up: Return in about 6 months (around 08/12/2015) for a follow-up visit.  Walker Kehr, MD

## 2015-02-09 NOTE — Progress Notes (Signed)
Pre visit review using our clinic review tool, if applicable. No additional management support is needed unless otherwise documented below in the visit note. 

## 2015-02-09 NOTE — Assessment & Plan Note (Signed)
S/p L TKR Oxycod prn - rare  Potential benefits of a short term opioids use as well as potential risks (i.e. addiction risk, apnea etc) and complications (i.e. Somnolence, constipation and others) were explained to the patient and were aknowledged.

## 2015-04-13 ENCOUNTER — Telehealth: Payer: Self-pay | Admitting: *Deleted

## 2015-04-13 MED ORDER — OXYCODONE HCL 5 MG PO TABS: 5.0000 mg | ORAL_TABLET | Freq: Three times a day (TID) | ORAL | Status: DC | PRN

## 2015-04-13 NOTE — Telephone Encounter (Signed)
Ok OV q 3 mo UDS/contract Thx

## 2015-04-13 NOTE — Telephone Encounter (Signed)
Pt is requesting refill on Oxycodone. Please advise.

## 2015-04-14 NOTE — Telephone Encounter (Signed)
Notified pt rx ready for pick-up.../lm,b 

## 2015-04-30 ENCOUNTER — Ambulatory Visit (INDEPENDENT_AMBULATORY_CARE_PROVIDER_SITE_OTHER): Payer: Medicare Other | Admitting: Podiatry

## 2015-04-30 ENCOUNTER — Encounter: Payer: Self-pay | Admitting: Podiatry

## 2015-04-30 VITALS — BP 154/94 | HR 93

## 2015-04-30 DIAGNOSIS — B351 Tinea unguium: Secondary | ICD-10-CM

## 2015-04-30 DIAGNOSIS — M79606 Pain in leg, unspecified: Secondary | ICD-10-CM

## 2015-04-30 NOTE — Progress Notes (Signed)
Subjective:  74 year old male presents requesting toe nails and calluses trimmed.  Calluses under the big toe bothers him and also on left bunion he wants to have it fixed some time soon.  Has had left knee surgery and recovering well.   Objective:  Thick dystrophic nails x 10.  Plantar IPJ callus under great toe bilateral. Hallux valgus with bunion bilateral.  Hyperextended hallux bilateral with IPJ plantar callus.   Assessment: Onychomycosis x 10.  Callus under left great toe IPJ plantar surface.  Painful feet.  HAV with bunion bilateral.   Plan: Debrided all nails and calluses.  Return as needed.

## 2015-04-30 NOTE — Patient Instructions (Signed)
Seen for hypertrophic nails and calluses. All nails and calluses debrided. Return in 3 months or as needed.  

## 2015-07-14 ENCOUNTER — Telehealth: Payer: Self-pay | Admitting: Internal Medicine

## 2015-07-14 NOTE — Telephone Encounter (Signed)
Pt requesting refill for oxyCODONE (OXY IR/ROXICODONE) 5 MG immediate release tablet [id number] He states his knee is still bothering him from the replacement he got. He can be reached at [phone]

## 2015-07-15 MED ORDER — OXYCODONE HCL 5 MG PO TABS: 5.0000 mg | ORAL_TABLET | Freq: Three times a day (TID) | ORAL | Status: DC | PRN

## 2015-07-15 NOTE — Telephone Encounter (Signed)
OK #12 tabs Needs OV Thx

## 2015-07-15 NOTE — Telephone Encounter (Signed)
Printed rx will hold until md return tomorrow to sign,,,/lmb

## 2015-07-16 NOTE — Telephone Encounter (Signed)
Notified pt with md response. Rx left for pick-up...Johny Chess

## 2015-07-29 ENCOUNTER — Ambulatory Visit (INDEPENDENT_AMBULATORY_CARE_PROVIDER_SITE_OTHER): Payer: Medicare Other | Admitting: Podiatry

## 2015-07-29 ENCOUNTER — Encounter: Payer: Self-pay | Admitting: Podiatry

## 2015-07-29 VITALS — BP 156/85 | HR 96

## 2015-07-29 DIAGNOSIS — M79606 Pain in leg, unspecified: Secondary | ICD-10-CM | POA: Diagnosis not present

## 2015-07-29 DIAGNOSIS — B351 Tinea unguium: Secondary | ICD-10-CM

## 2015-07-29 NOTE — Progress Notes (Signed)
Subjective:  74 year old male presents requesting toe nails and calluses trimmed.  Calluses under the big toe bothers him and also on left bunion he wants to have it fixed when the weather gets warm.  Doing well since the left knee surgery.  Objective:  Thick dystrophic nails x 10.  Plantar IPJ callus under great toe bilateral. Hallux valgus with bunion bilateral.  Hyperextended hallux bilateral with IPJ plantar callus.   Assessment: Onychomycosis x 10.  Callus under left great toe IPJ plantar surface.  Painful feet.  HAV with bunion bilateral.   Plan: Debrided all nails and calluses.  Return as needed.

## 2015-07-29 NOTE — Patient Instructions (Signed)
Seen for hypertrophic nails. All nails debrided. Return in 3 months or as needed.  

## 2015-07-30 ENCOUNTER — Ambulatory Visit: Payer: Medicare Other | Admitting: Podiatry

## 2015-08-12 ENCOUNTER — Ambulatory Visit (INDEPENDENT_AMBULATORY_CARE_PROVIDER_SITE_OTHER): Payer: Medicare Other | Admitting: Internal Medicine

## 2015-08-12 ENCOUNTER — Encounter: Payer: Self-pay | Admitting: Internal Medicine

## 2015-08-12 ENCOUNTER — Other Ambulatory Visit (INDEPENDENT_AMBULATORY_CARE_PROVIDER_SITE_OTHER): Payer: Medicare Other

## 2015-08-12 VITALS — BP 130/80 | HR 97 | Ht 74.0 in | Wt 211.0 lb

## 2015-08-12 DIAGNOSIS — M1712 Unilateral primary osteoarthritis, left knee: Secondary | ICD-10-CM

## 2015-08-12 DIAGNOSIS — R7989 Other specified abnormal findings of blood chemistry: Secondary | ICD-10-CM | POA: Diagnosis not present

## 2015-08-12 DIAGNOSIS — Z0000 Encounter for general adult medical examination without abnormal findings: Secondary | ICD-10-CM

## 2015-08-12 DIAGNOSIS — E785 Hyperlipidemia, unspecified: Secondary | ICD-10-CM | POA: Diagnosis not present

## 2015-08-12 DIAGNOSIS — N320 Bladder-neck obstruction: Secondary | ICD-10-CM | POA: Diagnosis not present

## 2015-08-12 DIAGNOSIS — K219 Gastro-esophageal reflux disease without esophagitis: Secondary | ICD-10-CM

## 2015-08-12 LAB — CBC WITH DIFFERENTIAL/PLATELET
Basophils Absolute: 0 10*3/uL (ref 0.0–0.1)
Basophils Relative: 0.6 % (ref 0.0–3.0)
EOS ABS: 0.1 10*3/uL (ref 0.0–0.7)
EOS PCT: 2.5 % (ref 0.0–5.0)
HCT: 43.2 % (ref 39.0–52.0)
HEMOGLOBIN: 14.1 g/dL (ref 13.0–17.0)
LYMPHS ABS: 1.2 10*3/uL (ref 0.7–4.0)
LYMPHS PCT: 23.4 % (ref 12.0–46.0)
MCHC: 32.6 g/dL (ref 30.0–36.0)
MCV: 87.2 fl (ref 78.0–100.0)
MONO ABS: 0.5 10*3/uL (ref 0.1–1.0)
MONOS PCT: 9.7 % (ref 3.0–12.0)
NEUTROS PCT: 63.8 % (ref 43.0–77.0)
Neutro Abs: 3.2 10*3/uL (ref 1.4–7.7)
PLATELETS: 219 10*3/uL (ref 150.0–400.0)
RBC: 4.95 Mil/uL (ref 4.22–5.81)
RDW: 14.7 % (ref 11.5–15.5)
WBC: 5.1 10*3/uL (ref 4.0–10.5)

## 2015-08-12 LAB — URINALYSIS
Bilirubin Urine: NEGATIVE
Hgb urine dipstick: NEGATIVE
KETONES UR: NEGATIVE
Leukocytes, UA: NEGATIVE
Nitrite: NEGATIVE
PH: 6.5 (ref 5.0–8.0)
SPECIFIC GRAVITY, URINE: 1.025 (ref 1.000–1.030)
TOTAL PROTEIN, URINE-UPE24: NEGATIVE
URINE GLUCOSE: NEGATIVE
Urobilinogen, UA: 0.2 (ref 0.0–1.0)

## 2015-08-12 LAB — HEPATIC FUNCTION PANEL
ALK PHOS: 88 U/L (ref 39–117)
ALT: 20 U/L (ref 0–53)
AST: 24 U/L (ref 0–37)
Albumin: 4.4 g/dL (ref 3.5–5.2)
BILIRUBIN DIRECT: 0.1 mg/dL (ref 0.0–0.3)
Total Bilirubin: 0.5 mg/dL (ref 0.2–1.2)
Total Protein: 7.3 g/dL (ref 6.0–8.3)

## 2015-08-12 LAB — TSH: TSH: 1.07 u[IU]/mL (ref 0.35–4.50)

## 2015-08-12 LAB — BASIC METABOLIC PANEL
BUN: 13 mg/dL (ref 6–23)
CALCIUM: 9.7 mg/dL (ref 8.4–10.5)
CO2: 29 mEq/L (ref 19–32)
Chloride: 105 mEq/L (ref 96–112)
Creatinine, Ser: 0.99 mg/dL (ref 0.40–1.50)
GFR: 95.01 mL/min (ref >60.00)
GLUCOSE: 103 mg/dL — AB (ref 70–99)
POTASSIUM: 4.1 meq/L (ref 3.5–5.1)
SODIUM: 142 meq/L (ref 135–145)

## 2015-08-12 LAB — LIPID PANEL
Cholesterol: 268 mg/dL — ABNORMAL HIGH (ref 0–200)
HDL: 49.3 mg/dL (ref >39.00)
NONHDL: 218.68
Total CHOL/HDL Ratio: 5
Triglycerides: 209 mg/dL — ABNORMAL HIGH (ref 0.0–149.0)
VLDL: 41.8 mg/dL — AB (ref 0.0–40.0)

## 2015-08-12 LAB — PSA: PSA: 2.76 ng/mL (ref 0.10–4.00)

## 2015-08-12 LAB — LDL CHOLESTEROL, DIRECT: Direct LDL: 168 mg/dL

## 2015-08-12 MED ORDER — OXYCODONE HCL 5 MG PO TABS: 5.0000 mg | ORAL_TABLET | Freq: Three times a day (TID) | ORAL | Status: DC | PRN

## 2015-08-12 NOTE — Progress Notes (Signed)
Pre visit review using our clinic review tool, if applicable. No additional management support is needed unless otherwise documented below in the visit note. 

## 2015-08-12 NOTE — Assessment & Plan Note (Signed)
Chronic GERD Nexium qd

## 2015-08-12 NOTE — Assessment & Plan Note (Signed)
Celebrex prn  Vit D Oxycod prn - rare  Potential benefits of a short term opioids use as well as potential risks (i.e. addiction risk, apnea etc) and complications (i.e. Somnolence, constipation and others) were explained to the patient and were aknowledged.

## 2015-08-12 NOTE — Progress Notes (Signed)
Subjective:  Patient ID: Brian Mora, male    DOB: 22-Dec-1940  Age: 75 y.o. MRN: [id number]  CC: No chief complaint on file.   HPI Brian Mora presents for a well exam. C/o OA - taking Oxycodone prn  Outpatient Prescriptions Prior to Visit  Medication Sig Dispense Refill  . acetaminophen (TYLENOL) 325 MG tablet Take 2 tablets (650 mg total) by mouth every 6 (six) hours as needed for mild pain (or Fever >/= 101).    Marland Kitchen aspirin EC 325 MG EC tablet 1 tab a day for the next 30 days to prevent blood clots 30 tablet 0  . celecoxib (CELEBREX) 200 MG capsule 1 tab po q day with food for pain and  swelling 30 capsule 0  . docusate sodium (COLACE) 100 MG capsule Take 1 capsule (100 mg total) by mouth 2 (two) times daily. 10 capsule 0  . esomeprazole (NEXIUM) 20 MG capsule Take 20 mg by mouth daily at 12 noon.    . loratadine (CLARITIN) 10 MG tablet Take 10 mg by mouth daily as needed for allergies.    Marland Kitchen OVER THE COUNTER MEDICATION 1 tablet. OTC antacid from [facility]    . polyethylene glycol (MIRALAX / GLYCOLAX) packet Take 17 g by mouth 2 (two) times daily. 60 each 0  . Simethicone (GAS-X PO) Take 1 tablet by mouth as needed.    Marland Kitchen oxyCODONE (OXY IR/ROXICODONE) 5 MG immediate release tablet Take 1 tablet (5 mg total) by mouth every 8 (eight) hours as needed for severe pain. 12 tablet 0   No facility-administered medications prior to visit.    ROS Review of Systems  Constitutional: Negative for appetite change, fatigue and unexpected weight change.  HENT: Negative for congestion, nosebleeds, sneezing, sore throat and trouble swallowing.   Eyes: Negative for itching and visual disturbance.  Respiratory: Negative for cough.   Cardiovascular: Negative for chest pain, palpitations and leg swelling.  Gastrointestinal: Negative for nausea, diarrhea, blood in stool and abdominal distention.  Genitourinary: Negative for frequency and hematuria.  Musculoskeletal: Positive for arthralgias.  Negative for back pain, joint swelling, gait problem and neck pain.  Skin: Negative for rash.  Neurological: Negative for dizziness, tremors, speech difficulty and weakness.  Psychiatric/Behavioral: Negative for sleep disturbance, dysphoric mood and agitation. The patient is not nervous/anxious.     Objective:  BP 130/80 mmHg  Pulse 97  Ht 6\' 2"  (1.88 m)  Wt 211 lb (95.709 kg)  BMI 27.08 kg/m2  SpO2 94%  BP Readings from Last 3 Encounters:  08/12/15 130/80  07/29/15 156/85  04/30/15 154/94    Wt Readings from Last 3 Encounters:  08/12/15 211 lb (95.709 kg)  02/09/15 194 lb (87.998 kg)  11/09/14 195 lb (88.451 kg)    Physical Exam  Constitutional: He is oriented to person, place, and time. He appears well-developed and well-nourished. No distress.  HENT:  Head: Normocephalic and atraumatic.  Right Ear: External ear normal.  Left Ear: External ear normal.  Nose: Nose normal.  Mouth/Throat: Oropharynx is clear and moist. No oropharyngeal exudate.  Eyes: Conjunctivae and EOM are normal. Pupils are equal, round, and reactive to light. Right eye exhibits no discharge. Left eye exhibits no discharge. No scleral icterus.  Neck: Normal range of motion. Neck supple. No JVD present. No tracheal deviation present. No thyromegaly present.  Cardiovascular: Normal rate, regular rhythm, normal heart sounds and intact distal pulses.  Exam reveals no gallop and no friction rub.   No murmur heard.  Pulmonary/Chest: Effort normal and breath sounds normal. No stridor. No respiratory distress. He has no wheezes. He has no rales. He exhibits no tenderness.  Abdominal: Soft. Bowel sounds are normal. He exhibits no distension and no mass. There is no tenderness. There is no rebound and no guarding.  Genitourinary: Rectum normal, prostate normal and penis normal. Guaiac negative stool. No penile tenderness.  Musculoskeletal: Normal range of motion. He exhibits tenderness. He exhibits no edema.    Lymphadenopathy:    He has no cervical adenopathy.  Neurological: He is alert and oriented to person, place, and time. He has normal reflexes. No cranial nerve deficit. He exhibits normal muscle tone. Coordination normal.  Skin: Skin is warm and dry. No rash noted. He is not diaphoretic. No erythema. No pallor.  Psychiatric: He has a normal mood and affect. His behavior is normal. Judgment and thought content normal.    Lab Results  Component Value Date   WBC 12.4* 11/10/2014   HGB 11.1* 11/10/2014   HCT 34.5* 11/10/2014   PLT 228 11/10/2014   GLUCOSE 145* 11/10/2014   CHOL 204* 04/17/2013   TRIG 114.0 04/17/2013   HDL 53.60 04/17/2013   LDLDIRECT 126.7 04/17/2013   ALT 18 10/29/2014   AST 33 10/29/2014   NA 141 11/10/2014   K 4.3 11/10/2014   CL 105 11/10/2014   CREATININE 1.04 11/10/2014   BUN 10 11/10/2014   CO2 31 11/10/2014   TSH 0.99 03/06/2007   INR 1.02 10/29/2014    Dg Chest 2 View  10/29/2014  CLINICAL DATA:  Arthroplasty.  Low O2 sat. EXAM: CHEST  2 VIEW COMPARISON:  None. FINDINGS: The heart size and mediastinal contours are within normal limits. Minimal basilar subsegmental atelectasis. Scoliosis thoracic spine. No acute bony abnormality. IMPRESSION: Minimal basilar subsegmental atelectasis, otherwise negative exam . Electronically Signed   By: Marcello Moores  Register   On: 10/29/2014 12:35    Assessment & Plan:   Diagnoses and all orders for this visit:  Well adult exam -     Basic metabolic panel; Future -     CBC with Differential/Platelet; Future -     Hepatic function panel; Future -     Lipid panel; Future -     PSA; Future -     TSH; Future -     Urinalysis; Future  Primary localized osteoarthritis of left knee -     Basic metabolic panel; Future -     CBC with Differential/Platelet; Future -     Hepatic function panel; Future -     Lipid panel; Future -     PSA; Future -     TSH; Future -     Urinalysis; Future  Gastroesophageal reflux disease  without esophagitis -     Basic metabolic panel; Future -     CBC with Differential/Platelet; Future -     Hepatic function panel; Future -     Lipid panel; Future -     PSA; Future -     TSH; Future -     Urinalysis; Future  Bladder neck obstruction -     PSA; Future  Dyslipidemia -     Lipid panel; Future -     TSH; Future  Other orders -     oxyCODONE (OXY IR/ROXICODONE) 5 MG immediate release tablet; Take 1 tablet (5 mg total) by mouth every 8 (eight) hours as needed for severe pain.  I am having Mr. Rosekrans maintain his loratadine, esomeprazole, Simethicone (GAS-X  PO), OVER THE COUNTER MEDICATION, acetaminophen, aspirin, celecoxib, docusate sodium, polyethylene glycol, and oxyCODONE.  Meds ordered this encounter  Medications  . oxyCODONE (OXY IR/ROXICODONE) 5 MG immediate release tablet    Sig: Take 1 tablet (5 mg total) by mouth every 8 (eight) hours as needed for severe pain.    Dispense:  60 tablet    Refill:  0     Follow-up: No Follow-up on file.  Walker Kehr, MD

## 2015-08-12 NOTE — Assessment & Plan Note (Signed)
Here for medicare wellness/physical  Diet: heart healthy  Physical activity: not sedentary  Depression/mood screen: negative  Hearing: intact to whispered voice  Visual acuity: grossly normal, performs annual eye exam  ADLs: capable  Fall risk: low to none  Home safety: good  Cognitive evaluation: intact to orientation, naming, recall and repetition  EOL planning: adv directives, full code/ I agree  I have personally reviewed and have noted  1. The patient's medical, surgical and social history  2. Their use of alcohol, tobacco or illicit drugs  3. Their current medications and supplements  4. The patient's functional ability including ADL's, fall risks, home safety risks and hearing or visual impairment.  5. Diet and physical activities  6. Evidence for depression or mood disorders 7. The roster of all physicians providing medical care to patient - is listed in the Snapshot section of the chart and reviewed today.    Today patient counseled on age appropriate routine health concerns for screening and prevention, each reviewed and up to date or declined. Immunizations reviewed and up to date or declined. Labs ordered and reviewed. Risk factors for depression reviewed and negative. Hearing function and visual acuity are intact. ADLs screened and addressed as needed. Functional ability and level of safety reviewed and appropriate. Education, counseling and referrals performed based on assessed risks today. Patient provided with a copy of personalized plan for preventive services.       Flex sig  1993. Declined colonoscopy. Cologuard discussed  Immunizations: Tetanus Sept '14, Pneumonia Vaccine Sept 14

## 2015-08-12 NOTE — Patient Instructions (Signed)

## 2015-08-16 ENCOUNTER — Telehealth: Payer: Self-pay

## 2015-08-16 NOTE — Telephone Encounter (Signed)
Called and spoke to patient, he was wanting to know which medication you would put him on before he agreed to taking medication.

## 2015-08-16 NOTE — Telephone Encounter (Signed)
We can try Crestor 10 mg Thx

## 2015-10-27 ENCOUNTER — Encounter: Payer: Self-pay | Admitting: Podiatry

## 2015-10-27 ENCOUNTER — Ambulatory Visit (INDEPENDENT_AMBULATORY_CARE_PROVIDER_SITE_OTHER): Payer: Medicare Other | Admitting: Podiatry

## 2015-10-27 VITALS — BP 134/84 | HR 97

## 2015-10-27 DIAGNOSIS — M79606 Pain in leg, unspecified: Secondary | ICD-10-CM | POA: Diagnosis not present

## 2015-10-27 DIAGNOSIS — B351 Tinea unguium: Secondary | ICD-10-CM | POA: Diagnosis not present

## 2015-10-27 NOTE — Progress Notes (Signed)
Subjective:  75 year old male presents requesting toe nails and calluses trimmed.  Calluses under the big toe bothers him and also on left bunion he wants to have it fixed when the weather gets warm.  Doing well since the left knee surgery.  Objective:  Thick dystrophic nails x 10.  Plantar IPJ callus under great toe bilateral. Hallux valgus with bunion bilateral.  Hyperextended hallux bilateral with IPJ plantar callus.   Assessment: Onychomycosis x 10.  Callus under left great toe IPJ plantar surface.  Painful feet.  HAV with bunion bilateral.   Plan: Debrided all nails and calluses.  Return as needed.

## 2015-10-27 NOTE — Patient Instructions (Signed)
Seen for hypertrophic nails and calluses. All nails and calluses debrided. May benefit from bunion surgery to relieve pain at the left bunion. Return in 3 months or if sooner if needed.

## 2015-12-11 DIAGNOSIS — H40033 Anatomical narrow angle, bilateral: Secondary | ICD-10-CM | POA: Diagnosis not present

## 2015-12-11 DIAGNOSIS — H2513 Age-related nuclear cataract, bilateral: Secondary | ICD-10-CM | POA: Diagnosis not present

## 2016-01-27 ENCOUNTER — Encounter: Payer: Self-pay | Admitting: Podiatry

## 2016-01-27 ENCOUNTER — Ambulatory Visit (INDEPENDENT_AMBULATORY_CARE_PROVIDER_SITE_OTHER): Payer: Medicare Other | Admitting: Podiatry

## 2016-01-27 VITALS — BP 143/71 | HR 99

## 2016-01-27 DIAGNOSIS — M79606 Pain in leg, unspecified: Secondary | ICD-10-CM

## 2016-01-27 DIAGNOSIS — B351 Tinea unguium: Secondary | ICD-10-CM | POA: Diagnosis not present

## 2016-01-27 NOTE — Patient Instructions (Signed)
Seen for hypertrophic nails and painful calluses. All nails and calluses debrided. Return in 3 months or as needed.  

## 2016-01-27 NOTE — Progress Notes (Signed)
Subjective:  75 year old male presents requesting toe nails and calluses trimmed.  Calluses under the big toe bothers him and also on left bunion hurts. Left big toe nails is hitting the 2nd toe and hurts.   Objective:  Thick dystrophic nails x 10.  Plantar IPJ callus under great toe bilateral. Hallux valgus with bunion bilateral.  Hyperextended hallux bilateral with IPJ plantar callus.   Assessment: Onychomycosis x 10.  Callus under left great toe IPJ plantar surface.  Painful feet.  HAV with bunion bilateral.   Plan: Debrided all nails and calluses.  Return as needed.

## 2016-02-11 ENCOUNTER — Ambulatory Visit: Payer: Medicare Other | Admitting: Internal Medicine

## 2016-02-18 ENCOUNTER — Other Ambulatory Visit (INDEPENDENT_AMBULATORY_CARE_PROVIDER_SITE_OTHER): Payer: Medicare Other

## 2016-02-18 ENCOUNTER — Encounter: Payer: Self-pay | Admitting: Internal Medicine

## 2016-02-18 ENCOUNTER — Ambulatory Visit (INDEPENDENT_AMBULATORY_CARE_PROVIDER_SITE_OTHER): Payer: Medicare Other | Admitting: Internal Medicine

## 2016-02-18 VITALS — BP 130/90 | HR 95 | Wt 209.0 lb

## 2016-02-18 DIAGNOSIS — R3915 Urgency of urination: Secondary | ICD-10-CM

## 2016-02-18 DIAGNOSIS — R109 Unspecified abdominal pain: Secondary | ICD-10-CM

## 2016-02-18 DIAGNOSIS — E785 Hyperlipidemia, unspecified: Secondary | ICD-10-CM | POA: Diagnosis not present

## 2016-02-18 LAB — CBC WITH DIFFERENTIAL/PLATELET
BASOS ABS: 0 10*3/uL (ref 0.0–0.1)
Basophils Relative: 0.6 % (ref 0.0–3.0)
EOS ABS: 0.2 10*3/uL (ref 0.0–0.7)
Eosinophils Relative: 3.2 % (ref 0.0–5.0)
HEMATOCRIT: 41.4 % (ref 39.0–52.0)
HEMOGLOBIN: 13.5 g/dL (ref 13.0–17.0)
LYMPHS PCT: 27.6 % (ref 12.0–46.0)
Lymphs Abs: 1.6 10*3/uL (ref 0.7–4.0)
MCHC: 32.6 g/dL (ref 30.0–36.0)
MCV: 86.1 fl (ref 78.0–100.0)
MONOS PCT: 11.7 % (ref 3.0–12.0)
Monocytes Absolute: 0.7 10*3/uL (ref 0.1–1.0)
NEUTROS ABS: 3.4 10*3/uL (ref 1.4–7.7)
Neutrophils Relative %: 56.9 % (ref 43.0–77.0)
PLATELETS: 226 10*3/uL (ref 150.0–400.0)
RBC: 4.8 Mil/uL (ref 4.22–5.81)
RDW: 14.7 % (ref 11.5–15.5)
WBC: 5.9 10*3/uL (ref 4.0–10.5)

## 2016-02-18 LAB — HEPATIC FUNCTION PANEL
ALT: 29 U/L (ref 0–53)
AST: 35 U/L (ref 0–37)
Albumin: 4.2 g/dL (ref 3.5–5.2)
Alkaline Phosphatase: 76 U/L (ref 39–117)
BILIRUBIN DIRECT: 0.1 mg/dL (ref 0.0–0.3)
BILIRUBIN TOTAL: 0.3 mg/dL (ref 0.2–1.2)
Total Protein: 7.8 g/dL (ref 6.0–8.3)

## 2016-02-18 LAB — URINALYSIS
Bilirubin Urine: NEGATIVE
Hgb urine dipstick: NEGATIVE
LEUKOCYTES UA: NEGATIVE
NITRITE: NEGATIVE
PH: 6 (ref 5.0–8.0)
SPECIFIC GRAVITY, URINE: 1.025 (ref 1.000–1.030)
Total Protein, Urine: NEGATIVE
UROBILINOGEN UA: 0.2 (ref 0.0–1.0)
Urine Glucose: NEGATIVE

## 2016-02-18 LAB — BASIC METABOLIC PANEL
BUN: 15 mg/dL (ref 6–23)
CALCIUM: 10 mg/dL (ref 8.4–10.5)
CO2: 32 meq/L (ref 19–32)
CREATININE: 1.08 mg/dL (ref 0.40–1.50)
Chloride: 101 mEq/L (ref 96–112)
GFR: 85.81 mL/min (ref >60.00)
Glucose, Bld: 93 mg/dL (ref 70–99)
Potassium: 4.5 mEq/L (ref 3.5–5.1)
SODIUM: 141 meq/L (ref 135–145)

## 2016-02-18 LAB — TSH: TSH: 1.39 u[IU]/mL (ref 0.35–4.50)

## 2016-02-18 MED ORDER — TOLTERODINE TARTRATE ER 4 MG PO CP24: 4.0000 mg | ORAL_CAPSULE | Freq: Every day | ORAL | Status: DC

## 2016-02-18 NOTE — Assessment & Plan Note (Signed)
7/17 new x 6 mo ?etiology Labs US kidneys

## 2016-02-18 NOTE — Patient Instructions (Signed)
Lactose Intolerance, Adult Lactose is the natural sugar found in milk and milk products, such as cheese and yogurt. Lactose is digested by lactase, an enzyme in your small intestine. Some people do not produce enough lactase to digest lactose. This is called lactose intolerance. Lactose intolerance is different from milk allergy, which is a more serious reaction to the protein in milk.  CAUSES Causes of lactose intolerance may include:   Normal aging. The ability to produce lactase may decline with age, causing lactose intolerance over time.  Being born without the ability to make lactase.   Digestive diseases such as gastroenteritis or inflammatory bowel disease.  Surgery or injuries to your small intestine.  Infection in your intestines.  Certain antibiotic medicines and cancer treatments. SIGNS AND SYMPTOMS  Lactose intolerance can cause uncomfortable symptoms. These are likely to occur within 30 minutes to 2 hours after eating or drinking foods containing lactose. Symptoms of lactose intolerance may include:  Nausea.  Diarrhea.  Abdominal cramps or pain.  Bloating.   Gas.  DIAGNOSIS  There are several tests your health care provider can do to diagnose lactose intolerance. These tests include a hydrogen breath test and stool acidity test.  TREATMENT  No treatment can improve your body's ability to produce lactase. However, your symptoms can be controlled by limiting or avoiding milk products and other sources of lactose and adjusting your diet. Lactose-free milk is often tolerated. Lactose digestion may also be improved by adding lactase drops to regular milk or by taking lactase tablets when dairy products are consumed. Tolerance to lactose is individual. Some people may be able to eat or drink small amounts of products with lactose, while other may need to avoid lactose entirely. Talk to your health care provider about what is best for you.  HOME CARE INSTRUCTIONS  Limit  or avoidfoods, beverages, and medicines containing lactose as directed by your health care provider.   Read food and medicine labels carefully to avoid products containing lactose, milk solids, casein, or whey.  If you eliminate dairy products, replace the protein, calcium, vitamin D, and other nutrients they contain through other foods. A registered dietitian or your health care provider can help you adjust your diet.  Choose a milk substitute that is fortified with calcium and vitamin D. Be aware that soy milk contains high quality protein, while milks made from nuts or grains contain very little protein.  Use lactase drops or tablets if directed by your health care provider. SEEK MEDICAL CARE IF: You have no relief from your symptoms after eliminating milk products and other sources of lactose.    This information is not intended to replace advice given to you by your health care provider. Make sure you discuss any questions you have with your health care provider.   Document Released: 07/24/2005 Document Revised: 08/14/2014 Document Reviewed: 10/24/2013 Elsevier Interactive Patient Education [employer].

## 2016-02-18 NOTE — Progress Notes (Signed)
Pre visit review using our clinic review tool, if applicable. No additional management support is needed unless otherwise documented below in the visit note. 

## 2016-02-18 NOTE — Progress Notes (Signed)
Subjective:  Patient ID: Brian Mora, male    DOB: Mar 30, 1941  Age: 75 y.o. MRN: [id number]  CC: No chief complaint on file.   HPI ESMOND WIDHALM presents for a c/o peeing a lot x 6 mo - occ urinary urge incontinence C/o bloating  Outpatient Prescriptions Prior to Visit  Medication Sig Dispense Refill  . acetaminophen (TYLENOL) 325 MG tablet Take 2 tablets (650 mg total) by mouth every 6 (six) hours as needed for mild pain (or Fever >/= 101).    Marland Kitchen aspirin EC 325 MG EC tablet 1 tab a day for the next 30 days to prevent blood clots 30 tablet 0  . docusate sodium (COLACE) 100 MG capsule Take 1 capsule (100 mg total) by mouth 2 (two) times daily. 10 capsule 0  . esomeprazole (NEXIUM) 20 MG capsule Take 20 mg by mouth daily at 12 noon.    . loratadine (CLARITIN) 10 MG tablet Take 10 mg by mouth daily as needed for allergies.    Marland Kitchen OVER THE COUNTER MEDICATION 1 tablet. OTC antacid from [facility]    . polyethylene glycol (MIRALAX / GLYCOLAX) packet Take 17 g by mouth 2 (two) times daily. 60 each 0  . Simethicone (GAS-X PO) Take 1 tablet by mouth as needed.    . celecoxib (CELEBREX) 200 MG capsule 1 tab po q day with food for pain and  swelling (Patient not taking: Reported on 02/18/2016) 30 capsule 0  . oxyCODONE (OXY IR/ROXICODONE) 5 MG immediate release tablet Take 1 tablet (5 mg total) by mouth every 8 (eight) hours as needed for severe pain. (Patient not taking: Reported on 02/18/2016) 60 tablet 0   No facility-administered medications prior to visit.    ROS Review of Systems  Constitutional: Negative for appetite change, fatigue and unexpected weight change.  HENT: Negative for congestion, nosebleeds, sneezing, sore throat and trouble swallowing.   Eyes: Negative for itching and visual disturbance.  Respiratory: Negative for cough.   Cardiovascular: Negative for chest pain, palpitations and leg swelling.  Gastrointestinal: Negative for nausea, diarrhea, blood in stool and  abdominal distention.  Genitourinary: Positive for urgency. Negative for dysuria, frequency, hematuria, flank pain, decreased urine volume, discharge, penile swelling, scrotal swelling, enuresis, difficulty urinating and penile pain.  Musculoskeletal: Negative for back pain, joint swelling, gait problem and neck pain.  Skin: Negative for rash.  Neurological: Negative for dizziness, tremors, speech difficulty and weakness.  Psychiatric/Behavioral: Negative for sleep disturbance, dysphoric mood and agitation. The patient is not nervous/anxious.     Objective:  BP 130/90 mmHg  Pulse 95  Wt 209 lb (94.802 kg)  SpO2 95%  BP Readings from Last 3 Encounters:  02/18/16 130/90  01/27/16 143/71  10/27/15 134/84    Wt Readings from Last 3 Encounters:  02/18/16 209 lb (94.802 kg)  08/12/15 211 lb (95.709 kg)  02/09/15 194 lb (87.998 kg)    Physical Exam  Constitutional: He is oriented to person, place, and time. He appears well-developed and well-nourished. No distress.  HENT:  Head: Normocephalic and atraumatic.  Right Ear: External ear normal.  Left Ear: External ear normal.  Nose: Nose normal.  Mouth/Throat: Oropharynx is clear and moist. No oropharyngeal exudate.  Eyes: Conjunctivae and EOM are normal. Pupils are equal, round, and reactive to light. Right eye exhibits no discharge. Left eye exhibits no discharge. No scleral icterus.  Neck: Normal range of motion. Neck supple. No JVD present. No tracheal deviation present. No thyromegaly present.  Cardiovascular: Normal  rate, regular rhythm, normal heart sounds and intact distal pulses.  Exam reveals no gallop and no friction rub.   No murmur heard. Pulmonary/Chest: Effort normal and breath sounds normal. No stridor. No respiratory distress. He has no wheezes. He has no rales. He exhibits no tenderness.  Abdominal: Soft. Bowel sounds are normal. He exhibits no distension and no mass. There is no tenderness. There is no rebound and no  guarding.  Genitourinary: Rectum normal, prostate normal and penis normal. Guaiac negative stool. No penile tenderness.  Musculoskeletal: Normal range of motion. He exhibits no edema or tenderness.  Lymphadenopathy:    He has no cervical adenopathy.  Neurological: He is alert and oriented to person, place, and time. He has normal reflexes. No cranial nerve deficit. He exhibits normal muscle tone. Coordination normal.  Skin: Skin is warm and dry. No rash noted. He is not diaphoretic. No erythema. No pallor.  Psychiatric: He has a normal mood and affect. His behavior is normal. Judgment and thought content normal.    Lab Results  Component Value Date   WBC 5.1 08/12/2015   HGB 14.1 08/12/2015   HCT 43.2 08/12/2015   PLT 219.0 08/12/2015   GLUCOSE 103* 08/12/2015   CHOL 268* 08/12/2015   TRIG 209.0* 08/12/2015   HDL 49.30 08/12/2015   LDLDIRECT 168.0 08/12/2015   ALT 20 08/12/2015   AST 24 08/12/2015   NA 142 08/12/2015   K 4.1 08/12/2015   CL 105 08/12/2015   CREATININE 0.99 08/12/2015   BUN 13 08/12/2015   CO2 29 08/12/2015   TSH 1.07 08/12/2015   PSA 2.76 08/12/2015   INR 1.02 10/29/2014    Dg Chest 2 View  10/29/2014  CLINICAL DATA:  Arthroplasty.  Low O2 sat. EXAM: CHEST  2 VIEW COMPARISON:  None. FINDINGS: The heart size and mediastinal contours are within normal limits. Minimal basilar subsegmental atelectasis. Scoliosis thoracic spine. No acute bony abnormality. IMPRESSION: Minimal basilar subsegmental atelectasis, otherwise negative exam . Electronically Signed   By: Marcello Moores  Register   On: 10/29/2014 12:35    Assessment & Plan:   There are no diagnoses linked to this encounter. I am having Mr. Pardy maintain his loratadine, esomeprazole, Simethicone (GAS-X PO), OVER THE COUNTER MEDICATION, acetaminophen, aspirin, celecoxib, docusate sodium, polyethylene glycol, and oxyCODONE.  No orders of the defined types were placed in this encounter.     Follow-up: No  Follow-up on file.  Walker Kehr, MD

## 2016-02-29 ENCOUNTER — Encounter: Payer: Self-pay | Admitting: Internal Medicine

## 2016-03-13 ENCOUNTER — Ambulatory Visit
Admission: RE | Admit: 2016-03-13 | Discharge: 2016-03-13 | Disposition: A | Discharge status: Home / Self Care | Payer: Medicare Other | Source: Ambulatory Visit | Attending: Internal Medicine | Admitting: Internal Medicine

## 2016-03-13 DIAGNOSIS — R109 Unspecified abdominal pain: Secondary | ICD-10-CM | POA: Diagnosis not present

## 2016-03-14 DIAGNOSIS — Z96652 Presence of left artificial knee joint: Secondary | ICD-10-CM | POA: Diagnosis not present

## 2016-03-24 ENCOUNTER — Encounter: Payer: Self-pay | Admitting: Internal Medicine

## 2016-03-24 ENCOUNTER — Ambulatory Visit (INDEPENDENT_AMBULATORY_CARE_PROVIDER_SITE_OTHER): Payer: Medicare Other | Admitting: Internal Medicine

## 2016-03-24 DIAGNOSIS — E785 Hyperlipidemia, unspecified: Secondary | ICD-10-CM

## 2016-03-24 DIAGNOSIS — R3915 Urgency of urination: Secondary | ICD-10-CM | POA: Diagnosis not present

## 2016-03-24 DIAGNOSIS — K219 Gastro-esophageal reflux disease without esophagitis: Secondary | ICD-10-CM

## 2016-03-24 MED ORDER — VITAMIN D3 50 MCG (2000 UT) PO CAPS: 2000.0000 [IU] | ORAL_CAPSULE | Freq: Every day | ORAL | 3 refills | Status: DC

## 2016-03-24 NOTE — Progress Notes (Signed)
Pre visit review using our clinic review tool, if applicable. No additional management support is needed unless otherwise documented below in the visit note. 

## 2016-03-24 NOTE — Assessment & Plan Note (Signed)
Statin intolerant 

## 2016-03-24 NOTE — Assessment & Plan Note (Signed)
Detrol helped a lot but they wanted $160 for the 2nd Rx

## 2016-03-24 NOTE — Progress Notes (Signed)
Subjective:  Patient ID: Brian Mora, male    DOB: Dec 30, 1940  Age: 75 y.o. MRN: [id number]  CC: No chief complaint on file.   HPI Brian Mora presents for urinary urgency - Detrol helped a lot but they wanted $160 for the 2nd Rx F/u HTN, OA f/u   Outpatient Medications Prior to Visit  Medication Sig Dispense Refill  . acetaminophen (TYLENOL) 325 MG tablet Take 2 tablets (650 mg total) by mouth every 6 (six) hours as needed for mild pain (or Fever >/= 101).    Marland Kitchen aspirin EC 325 MG EC tablet 1 tab a day for the next 30 days to prevent blood clots 30 tablet 0  . celecoxib (CELEBREX) 200 MG capsule 1 tab po q day with food for pain and  swelling 30 capsule 0  . docusate sodium (COLACE) 100 MG capsule Take 1 capsule (100 mg total) by mouth 2 (two) times daily. 10 capsule 0  . esomeprazole (NEXIUM) 20 MG capsule Take 20 mg by mouth daily at 12 noon.    . loratadine (CLARITIN) 10 MG tablet Take 10 mg by mouth daily as needed for allergies.    Marland Kitchen OVER THE COUNTER MEDICATION 1 tablet. OTC antacid from [facility]    . oxyCODONE (OXY IR/ROXICODONE) 5 MG immediate release tablet Take 1 tablet (5 mg total) by mouth every 8 (eight) hours as needed for severe pain. 60 tablet 0  . polyethylene glycol (MIRALAX / GLYCOLAX) packet Take 17 g by mouth 2 (two) times daily. 60 each 0  . Simethicone (GAS-X PO) Take 1 tablet by mouth as needed.    . tolterodine (DETROL LA) 4 MG 24 hr capsule Take 1 capsule (4 mg total) by mouth daily. 30 capsule 5   No facility-administered medications prior to visit.     ROS Review of Systems  Constitutional: Negative for appetite change, fatigue and unexpected weight change.  HENT: Negative for congestion, nosebleeds, sneezing, sore throat and trouble swallowing.   Eyes: Negative for itching and visual disturbance.  Respiratory: Negative for cough.   Cardiovascular: Negative for chest pain, palpitations and leg swelling.  Gastrointestinal: Negative for abdominal  distention, blood in stool, diarrhea and nausea.  Genitourinary: Positive for frequency and urgency. Negative for hematuria.  Musculoskeletal: Negative for back pain, gait problem, joint swelling and neck pain.  Skin: Negative for rash.  Neurological: Negative for dizziness, tremors, speech difficulty and weakness.  Psychiatric/Behavioral: Negative for agitation, dysphoric mood and sleep disturbance. The patient is not nervous/anxious.     Objective:  BP 124/80 (BP Location: Right Arm, Patient Position: Sitting, Cuff Size: Normal)   Pulse 97   Temp 97.8 F (36.6 C) (Oral)   Ht 6\' 2"  (1.88 m)   Wt 207 lb (93.9 kg)   SpO2 97%   BMI 26.58 kg/m   BP Readings from Last 3 Encounters:  03/24/16 124/80  02/18/16 130/90  01/27/16 (!) 143/71    Wt Readings from Last 3 Encounters:  03/24/16 207 lb (93.9 kg)  02/18/16 209 lb (94.8 kg)  08/12/15 211 lb (95.7 kg)    Physical Exam  Constitutional: He is oriented to person, place, and time. He appears well-developed. No distress.  NAD  HENT:  Mouth/Throat: Oropharynx is clear and moist.  Eyes: Conjunctivae are normal. Pupils are equal, round, and reactive to light.  Neck: Normal range of motion. No JVD present. No thyromegaly present.  Cardiovascular: Normal rate, regular rhythm, normal heart sounds and intact distal pulses.  Exam reveals no gallop and no friction rub.   No murmur heard. Pulmonary/Chest: Effort normal and breath sounds normal. No respiratory distress. He has no wheezes. He has no rales. He exhibits no tenderness.  Abdominal: Soft. Bowel sounds are normal. He exhibits no distension and no mass. There is no tenderness. There is no rebound and no guarding.  Musculoskeletal: Normal range of motion. He exhibits no edema or tenderness.  Lymphadenopathy:    He has no cervical adenopathy.  Neurological: He is alert and oriented to person, place, and time. He has normal reflexes. No cranial nerve deficit. He exhibits normal  muscle tone. He displays a negative Romberg sign. Coordination and gait normal.  Skin: Skin is warm and dry. No rash noted.  Psychiatric: He has a normal mood and affect. His behavior is normal. Judgment and thought content normal.    Lab Results  Component Value Date   WBC 5.9 02/18/2016   HGB 13.5 02/18/2016   HCT 41.4 02/18/2016   PLT 226.0 02/18/2016   GLUCOSE 93 02/18/2016   CHOL 268 (H) 08/12/2015   TRIG 209.0 (H) 08/12/2015   HDL 49.30 08/12/2015   LDLDIRECT 168.0 08/12/2015   ALT 29 02/18/2016   AST 35 02/18/2016   NA 141 02/18/2016   K 4.5 02/18/2016   CL 101 02/18/2016   CREATININE 1.08 02/18/2016   BUN 15 02/18/2016   CO2 32 02/18/2016   TSH 1.39 02/18/2016   PSA 2.76 08/12/2015   INR 1.02 10/29/2014    US Abdomen Complete  Result Date: 03/13/2016 CLINICAL DATA:  Abdominal pain . EXAM: ABDOMEN ULTRASOUND COMPLETE COMPARISON:  None. FINDINGS: Gallbladder: No gallstones or wall thickening visualized. No sonographic Murphy sign noted by sonographer. Common bile duct: Diameter: 5.7 mm Liver: There is echogenic consistent fatty infiltration and/or hepatocellular disease. No focal hepatic abnormality identified. IVC: No abnormality visualized. Pancreas: Visualized portion unremarkable. Spleen: Size and appearance within normal limits. Right Kidney: Length: 12.2 cm. Echogenicity within normal limits. No mass or hydronephrosis visualized. Left Kidney: Length: 11.9 cm. Echogenicity within normal limits. No mass or hydronephrosis visualized. 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 Other findings: None. IMPRESSION: 1. Liver is echogenic consistent fatty infiltration and/or hepatocellular disease . No acute abnormality. 2. 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 Recommend followup by ultrasound in 5 years. This recommendation follows ACR consensus guidelines: White Paper of the ACR Incidental Findings Committee II on  Vascular Findings. J Am Coll Radiol 2013; 10:789-794. Electronically Signed   By: Marcello Moores  Register   On: 03/13/2016 12:29    Assessment & Plan:   There are no diagnoses linked to this encounter. I am having Mr. Learn maintain his loratadine, esomeprazole, Simethicone (GAS-X PO), OVER THE COUNTER MEDICATION, acetaminophen, aspirin, celecoxib, docusate sodium, polyethylene glycol, oxyCODONE, and tolterodine.  No orders of the defined types were placed in this encounter.    Follow-up: No Follow-up on file.  Walker Kehr, MD

## 2016-03-24 NOTE — Assessment & Plan Note (Addendum)
Nexium qd  Potential benefits of a long term PPI use as well as potential risks  and complications were explained to the patient and were aknowledged. 

## 2016-04-27 ENCOUNTER — Ambulatory Visit (INDEPENDENT_AMBULATORY_CARE_PROVIDER_SITE_OTHER): Payer: Medicare Other | Admitting: Podiatry

## 2016-04-27 ENCOUNTER — Encounter: Payer: Self-pay | Admitting: Podiatry

## 2016-04-27 DIAGNOSIS — B351 Tinea unguium: Secondary | ICD-10-CM

## 2016-04-27 DIAGNOSIS — M79673 Pain in unspecified foot: Secondary | ICD-10-CM | POA: Diagnosis not present

## 2016-04-27 NOTE — Patient Instructions (Signed)
Seen for problematic nails and calluses. All nails and calluses debrided. Return in 3 months or as needed.

## 2016-04-27 NOTE — Progress Notes (Signed)
Subjective:  75 year old male presents requesting toe nails and calluses trimmed. Calluses got thick and iched bad. He had to scrape the one under the right big toe. Left bunion hurts at times but not ready to have surgery.   Objective:  Thick dystrophic nails x 10.  Plantar IPJ callus under great toe bilateral. Hallux valgus with bunion bilateral.  Hyperextended hallux bilateral with IPJ plantar callus, itches bad when gets thick build up.   Assessment: Onychomycosis x 10.  Symptomatic keratosis under left great toe IPJ plantar surface.  Painful feet.  HAV with bunion bilateral.   Plan: Debrided all nails and calluses.  Return in 3 month or sooner if needed.

## 2016-06-27 ENCOUNTER — Ambulatory Visit: Payer: Medicare Other | Admitting: Internal Medicine

## 2016-07-25 ENCOUNTER — Ambulatory Visit (INDEPENDENT_AMBULATORY_CARE_PROVIDER_SITE_OTHER): Payer: Medicare Other | Admitting: Internal Medicine

## 2016-07-25 ENCOUNTER — Encounter: Payer: Self-pay | Admitting: Internal Medicine

## 2016-07-25 DIAGNOSIS — K219 Gastro-esophageal reflux disease without esophagitis: Secondary | ICD-10-CM | POA: Diagnosis not present

## 2016-07-25 DIAGNOSIS — M1990 Unspecified osteoarthritis, unspecified site: Secondary | ICD-10-CM | POA: Insufficient documentation

## 2016-07-25 DIAGNOSIS — R3915 Urgency of urination: Secondary | ICD-10-CM | POA: Diagnosis not present

## 2016-07-25 DIAGNOSIS — E782 Mixed hyperlipidemia: Secondary | ICD-10-CM

## 2016-07-25 DIAGNOSIS — M1991 Primary osteoarthritis, unspecified site: Secondary | ICD-10-CM

## 2016-07-25 NOTE — Assessment & Plan Note (Signed)
Chronic, age related Celebrex prn  Potential benefits of a long term NSAID use as well as potential risks  and complications were explained to the patient and were aknowledged.

## 2016-07-25 NOTE — Assessment & Plan Note (Signed)
Nexium qd

## 2016-07-25 NOTE — Progress Notes (Signed)
Pre visit review using our clinic review tool, if applicable. No additional management support is needed unless otherwise documented below in the visit note. 

## 2016-07-25 NOTE — Patient Instructions (Signed)
Lactose Intolerance, Adult Introduction Lactose is the natural sugar found in milk and milk products, such as cheese and yogurt. Lactose is digested by lactase, an enzyme in your small intestine. Some people do not produce enough lactase to digest lactose. This is called lactose intolerance. Lactose intolerance is different from milk allergy, which is a more serious reaction to the protein in milk. What are the causes? Causes of lactose intolerance may include:  Normal aging. The ability to produce lactase may decline with age, causing lactose intolerance over time.  Being born without the ability to make lactase.  Digestive diseases such as gastroenteritis or inflammatory bowel disease.  Surgery or injuries to your small intestine.  Infection in your intestines.  Certain antibiotic medicines and cancer treatments. What are the signs or symptoms? Lactose intolerance can cause uncomfortable symptoms. These are likely to occur within 30 minutes to 2 hours after eating or drinking foods containing lactose. Symptoms of lactose intolerance may include:  Nausea.  Diarrhea.  Abdominal cramps or pain.  Bloating.  Gas. How is this diagnosed? There are several tests your health care provider can do to diagnose lactose intolerance. These tests include a hydrogen breath test and stool acidity test. How is this treated? No treatment can improve your body's ability to produce lactase. However, your symptoms can be controlled by limiting or avoiding milk products and other sources of lactose and adjusting your diet. Lactose-free milk is often tolerated. Lactose digestion may also be improved by adding lactase drops to regular milk or by taking lactase tablets when dairy products are consumed. Tolerance to lactose is individual. Some people may be able to eat or drink small amounts of products with lactose, while other may need to avoid lactose entirely. Talk to your health care provider about what  is best for you. Follow these instructions at home:  Limit or avoidfoods, beverages, and medicines containing lactose as directed by your health care provider.  Read food and medicine labels carefully to avoid products containing lactose, milk solids, casein, or whey.  If you eliminate dairy products, replace the protein, calcium, vitamin D, and other nutrients they contain through other foods. A registered dietitian or your health care provider can help you adjust your diet.  Choose a milk substitute that is fortified with calcium and vitamin D. Be aware that soy milk contains high quality protein, while milks made from nuts or grains contain very little protein.  Use lactase drops or tablets if directed by your health care provider. Contact a health care provider if: You have no relief from your symptoms after eliminating milk products and other sources of lactose. This information is not intended to replace advice given to you by your health care provider. Make sure you discuss any questions you have with your health care provider. Document Released: 07/24/2005 Document Revised: 12/30/2015 Document Reviewed: 10/24/2013  2017 Elsevier

## 2016-07-25 NOTE — Assessment & Plan Note (Signed)
Low fat diet

## 2016-07-25 NOTE — Assessment & Plan Note (Signed)
Detrol - he will get it in January

## 2016-07-25 NOTE — Progress Notes (Signed)
Subjective:  Patient ID: Brian Mora, male    DOB: 1941-03-24  Age: 75 y.o. MRN: [id number]  CC: No chief complaint on file.   HPI MASAMI SAMSON presents for OA, GERD, OAB f/u  Outpatient Medications Prior to Visit  Medication Sig Dispense Refill  . acetaminophen (TYLENOL) 325 MG tablet Take 2 tablets (650 mg total) by mouth every 6 (six) hours as needed for mild pain (or Fever >/= 101).    Marland Kitchen aspirin EC 325 MG EC tablet 1 tab a day for the next 30 days to prevent blood clots 30 tablet 0  . celecoxib (CELEBREX) 200 MG capsule 1 tab po q day with food for pain and  swelling 30 capsule 0  . Cholecalciferol (VITAMIN D3) 2000 units capsule Take 1 capsule (2,000 Units total) by mouth daily. 100 capsule 3  . docusate sodium (COLACE) 100 MG capsule Take 1 capsule (100 mg total) by mouth 2 (two) times daily. 10 capsule 0  . esomeprazole (NEXIUM) 20 MG capsule Take 20 mg by mouth daily at 12 noon.    . loratadine (CLARITIN) 10 MG tablet Take 10 mg by mouth daily as needed for allergies.    Marland Kitchen OVER THE COUNTER MEDICATION 1 tablet. OTC antacid from [facility]    . oxyCODONE (OXY IR/ROXICODONE) 5 MG immediate release tablet Take 1 tablet (5 mg total) by mouth every 8 (eight) hours as needed for severe pain. 60 tablet 0  . polyethylene glycol (MIRALAX / GLYCOLAX) packet Take 17 g by mouth 2 (two) times daily. 60 each 0  . Simethicone (GAS-X PO) Take 1 tablet by mouth as needed.    . tolterodine (DETROL LA) 4 MG 24 hr capsule Take 1 capsule (4 mg total) by mouth daily. 30 capsule 5   No facility-administered medications prior to visit.     ROS Review of Systems  Constitutional: Negative for appetite change, fatigue and unexpected weight change.  HENT: Negative for congestion, nosebleeds, sneezing, sore throat and trouble swallowing.   Eyes: Negative for itching and visual disturbance.  Respiratory: Negative for cough.   Cardiovascular: Negative for chest pain, palpitations and leg swelling.    Gastrointestinal: Negative for abdominal distention, blood in stool, diarrhea and nausea.  Genitourinary: Positive for frequency. Negative for hematuria.  Musculoskeletal: Positive for arthralgias and back pain. Negative for gait problem, joint swelling and neck pain.  Skin: Negative for rash.  Neurological: Negative for dizziness, tremors, speech difficulty and weakness.  Psychiatric/Behavioral: Negative for agitation, dysphoric mood and sleep disturbance. The patient is not nervous/anxious.     Objective:  Wt 214 lb (97.1 kg)   BMI 27.48 kg/m   BP Readings from Last 3 Encounters:  03/24/16 124/80  02/18/16 130/90  01/27/16 (!) 143/71    Wt Readings from Last 3 Encounters:  07/25/16 214 lb (97.1 kg)  03/24/16 207 lb (93.9 kg)  02/18/16 209 lb (94.8 kg)    Physical Exam  Constitutional: He is oriented to person, place, and time. He appears well-developed. No distress.  NAD  HENT:  Mouth/Throat: Oropharynx is clear and moist.  Eyes: Conjunctivae are normal. Pupils are equal, round, and reactive to light.  Neck: Normal range of motion. No JVD present. No thyromegaly present.  Cardiovascular: Normal rate, regular rhythm, normal heart sounds and intact distal pulses.  Exam reveals no gallop and no friction rub.   No murmur heard. Pulmonary/Chest: Effort normal and breath sounds normal. No respiratory distress. He has no wheezes. He has no  rales. He exhibits no tenderness.  Abdominal: Soft. Bowel sounds are normal. He exhibits no distension and no mass. There is no tenderness. There is no rebound and no guarding.  Musculoskeletal: Normal range of motion. He exhibits no edema or tenderness.  Lymphadenopathy:    He has no cervical adenopathy.  Neurological: He is alert and oriented to person, place, and time. He has normal reflexes. No cranial nerve deficit. He exhibits normal muscle tone. He displays a negative Romberg sign. Coordination and gait normal.  Skin: Skin is warm and  dry. No rash noted.  Psychiatric: He has a normal mood and affect. His behavior is normal. Judgment and thought content normal.    Lab Results  Component Value Date   WBC 5.9 02/18/2016   HGB 13.5 02/18/2016   HCT 41.4 02/18/2016   PLT 226.0 02/18/2016   GLUCOSE 93 02/18/2016   CHOL 268 (H) 08/12/2015   TRIG 209.0 (H) 08/12/2015   HDL 49.30 08/12/2015   LDLDIRECT 168.0 08/12/2015   ALT 29 02/18/2016   AST 35 02/18/2016   NA 141 02/18/2016   K 4.5 02/18/2016   CL 101 02/18/2016   CREATININE 1.08 02/18/2016   BUN 15 02/18/2016   CO2 32 02/18/2016   TSH 1.39 02/18/2016   PSA 2.76 08/12/2015   INR 1.02 10/29/2014    US Abdomen Complete  Result Date: 03/13/2016 CLINICAL DATA:  Abdominal pain . EXAM: ABDOMEN ULTRASOUND COMPLETE COMPARISON:  None. FINDINGS: Gallbladder: No gallstones or wall thickening visualized. No sonographic Murphy sign noted by sonographer. Common bile duct: Diameter: 5.7 mm Liver: There is echogenic consistent fatty infiltration and/or hepatocellular disease. No focal hepatic abnormality identified. IVC: No abnormality visualized. Pancreas: Visualized portion unremarkable. Spleen: Size and appearance within normal limits. Right Kidney: Length: 12.2 cm. Echogenicity within normal limits. No mass or hydronephrosis visualized. Left Kidney: Length: 11.9 cm. Echogenicity within normal limits. No mass or hydronephrosis visualized. 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 Other findings: None. IMPRESSION: 1. Liver is echogenic consistent fatty infiltration and/or hepatocellular disease . No acute abnormality. 2. 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 Recommend followup by ultrasound in 5 years. This recommendation follows ACR consensus guidelines: White Paper of the ACR Incidental Findings Committee II on Vascular Findings. J Am Coll Radiol 2013; 10:789-794. Electronically Signed   By: Marcello Moores  Register   On:  03/13/2016 12:29    Assessment & Plan:   There are no diagnoses linked to this encounter. I am having Mr. Carducci maintain his loratadine, esomeprazole, Simethicone (GAS-X PO), OVER THE COUNTER MEDICATION, acetaminophen, aspirin, celecoxib, docusate sodium, polyethylene glycol, oxyCODONE, tolterodine, and Vitamin D3.  No orders of the defined types were placed in this encounter.    Follow-up: No Follow-up on file.  Walker Kehr, MD

## 2016-07-27 ENCOUNTER — Ambulatory Visit (INDEPENDENT_AMBULATORY_CARE_PROVIDER_SITE_OTHER): Payer: Medicare Other | Admitting: Podiatry

## 2016-07-27 ENCOUNTER — Encounter: Payer: Self-pay | Admitting: Podiatry

## 2016-07-27 VITALS — BP 123/77 | HR 91

## 2016-07-27 DIAGNOSIS — M79606 Pain in leg, unspecified: Secondary | ICD-10-CM

## 2016-07-27 DIAGNOSIS — B351 Tinea unguium: Secondary | ICD-10-CM

## 2016-07-27 NOTE — Progress Notes (Signed)
Subjective: 75 year old male presents requesting toe nails and calluses trimmed.  Calluses are painful under the both big toes.   Objective: Thick dystrophic nails x 10.  Plantar IPJ callus under great toe bilateral. Hallux valgus with bunion bilateral.  Hyperextended hallux bilateral with IPJ plantar callus, itches bad when gets thick build up.   Assessment: Onychomycosis x 10.  Symptomatic keratosisunder left great toe IPJ plantar surface.  Painful feet.  HAV with bunion bilateral.   Plan: Debrided all nails and calluses.  Return in 3 month or sooner if needed. 

## 2016-07-27 NOTE — Patient Instructions (Signed)
Seen for hypertrophic nails and painful calluses. All nails and calluses debrided. Return in 3 months or as needed.  

## 2016-08-29 ENCOUNTER — Encounter: Payer: Self-pay | Admitting: Internal Medicine

## 2016-08-29 ENCOUNTER — Other Ambulatory Visit (INDEPENDENT_AMBULATORY_CARE_PROVIDER_SITE_OTHER): Payer: Medicare Other

## 2016-08-29 ENCOUNTER — Other Ambulatory Visit: Payer: Medicare Other

## 2016-08-29 ENCOUNTER — Ambulatory Visit (INDEPENDENT_AMBULATORY_CARE_PROVIDER_SITE_OTHER): Payer: Medicare Other | Admitting: Internal Medicine

## 2016-08-29 VITALS — BP 130/88 | HR 89 | Ht 74.0 in | Wt 208.0 lb

## 2016-08-29 DIAGNOSIS — E782 Mixed hyperlipidemia: Secondary | ICD-10-CM | POA: Diagnosis not present

## 2016-08-29 DIAGNOSIS — L309 Dermatitis, unspecified: Secondary | ICD-10-CM

## 2016-08-29 DIAGNOSIS — N320 Bladder-neck obstruction: Secondary | ICD-10-CM

## 2016-08-29 DIAGNOSIS — K219 Gastro-esophageal reflux disease without esophagitis: Secondary | ICD-10-CM

## 2016-08-29 DIAGNOSIS — M1712 Unilateral primary osteoarthritis, left knee: Secondary | ICD-10-CM

## 2016-08-29 DIAGNOSIS — Z0000 Encounter for general adult medical examination without abnormal findings: Secondary | ICD-10-CM | POA: Diagnosis not present

## 2016-08-29 LAB — LIPID PANEL
CHOL/HDL RATIO: 5
CHOLESTEROL: 246 mg/dL — AB (ref 0–200)
HDL: 52.8 mg/dL (ref >39.00)
LDL CALC: 161 mg/dL — AB (ref 0–99)
NonHDL: 192.84
TRIGLYCERIDES: 157 mg/dL — AB (ref 0.0–149.0)
VLDL: 31.4 mg/dL (ref 0.0–40.0)

## 2016-08-29 LAB — CBC WITH DIFFERENTIAL/PLATELET
BASOS PCT: 0.4 % (ref 0.0–3.0)
Basophils Absolute: 0 10*3/uL (ref 0.0–0.1)
EOS PCT: 4.5 % (ref 0.0–5.0)
Eosinophils Absolute: 0.2 10*3/uL (ref 0.0–0.7)
HCT: 40.6 % (ref 39.0–52.0)
HEMOGLOBIN: 13.6 g/dL (ref 13.0–17.0)
Lymphocytes Relative: 32.4 % (ref 12.0–46.0)
Lymphs Abs: 1.6 10*3/uL (ref 0.7–4.0)
MCHC: 33.6 g/dL (ref 30.0–36.0)
MCV: 85.4 fl (ref 78.0–100.0)
MONO ABS: 0.6 10*3/uL (ref 0.1–1.0)
Monocytes Relative: 11.2 % (ref 3.0–12.0)
Neutro Abs: 2.6 10*3/uL (ref 1.4–7.7)
Neutrophils Relative %: 51.5 % (ref 43.0–77.0)
Platelets: 215 10*3/uL (ref 150.0–400.0)
RBC: 4.75 Mil/uL (ref 4.22–5.81)
RDW: 14.2 % (ref 11.5–15.5)
WBC: 5 10*3/uL (ref 4.0–10.5)

## 2016-08-29 LAB — BASIC METABOLIC PANEL
BUN: 7 mg/dL (ref 6–23)
CHLORIDE: 104 meq/L (ref 96–112)
CO2: 31 mEq/L (ref 19–32)
CREATININE: 1.08 mg/dL (ref 0.40–1.50)
Calcium: 9.6 mg/dL (ref 8.4–10.5)
GFR: 85.69 mL/min (ref >60.00)
Glucose, Bld: 105 mg/dL — ABNORMAL HIGH (ref 70–99)
POTASSIUM: 4.1 meq/L (ref 3.5–5.1)
Sodium: 141 mEq/L (ref 135–145)

## 2016-08-29 LAB — HEPATIC FUNCTION PANEL
ALT: 21 U/L (ref 0–53)
AST: 21 U/L (ref 0–37)
Albumin: 4.3 g/dL (ref 3.5–5.2)
Alkaline Phosphatase: 75 U/L (ref 39–117)
BILIRUBIN DIRECT: 0.1 mg/dL (ref 0.0–0.3)
BILIRUBIN TOTAL: 0.5 mg/dL (ref 0.2–1.2)
Total Protein: 7.5 g/dL (ref 6.0–8.3)

## 2016-08-29 LAB — URINALYSIS
Bilirubin Urine: NEGATIVE
Hgb urine dipstick: NEGATIVE
Ketones, ur: NEGATIVE
Leukocytes, UA: NEGATIVE
Nitrite: NEGATIVE
SPECIFIC GRAVITY, URINE: 1.02 (ref 1.000–1.030)
Total Protein, Urine: NEGATIVE
URINE GLUCOSE: NEGATIVE
Urobilinogen, UA: 0.2 (ref 0.0–1.0)
pH: 7.5 (ref 5.0–8.0)

## 2016-08-29 LAB — PSA: PSA: 6.38 ng/mL — ABNORMAL HIGH (ref 0.10–4.00)

## 2016-08-29 LAB — TSH: TSH: 0.76 u[IU]/mL (ref 0.35–4.50)

## 2016-08-29 MED ORDER — TRIAMCINOLONE ACETONIDE 0.1 % EX OINT: 1.0000 "application " | TOPICAL_OINTMENT | Freq: Two times a day (BID) | CUTANEOUS | 3 refills | Status: DC

## 2016-08-29 MED ORDER — VITAMIN D3 50 MCG (2000 UT) PO CAPS: 2000.0000 [IU] | ORAL_CAPSULE | Freq: Every day | ORAL | 3 refills | Status: DC

## 2016-08-29 MED ORDER — TOLTERODINE TARTRATE ER 4 MG PO CP24: 4.0000 mg | ORAL_CAPSULE | Freq: Every day | ORAL | 11 refills | Status: DC

## 2016-08-29 MED ORDER — CELECOXIB 200 MG PO CAPS
ORAL_CAPSULE | ORAL | 3 refills | Status: DC
Start: 2016-08-29 — End: 2016-11-27

## 2016-08-29 NOTE — Assessment & Plan Note (Addendum)
Triamc oin 

## 2016-08-29 NOTE — Assessment & Plan Note (Signed)
Labs

## 2016-08-29 NOTE — Assessment & Plan Note (Signed)
Nex[employer] 

## 2016-08-29 NOTE — Patient Instructions (Signed)

## 2016-08-29 NOTE — Progress Notes (Signed)
Pre visit review using our clinic review tool, if applicable. No additional management support is needed unless otherwise documented below in the visit note. 

## 2016-08-29 NOTE — Assessment & Plan Note (Signed)
Here for medicare wellness/physical  Diet: heart healthy  Physical activity: not sedentary  Depression/mood screen: negative  Hearing: intact to whispered voice  Visual acuity: grossly normal, performs annual eye exam - reading glasses ADLs: capable  Fall risk: low to none  Home safety: good  Cognitive evaluation: intact to orientation, naming, recall and repetition  EOL planning: adv directives, full code/ I agree  I have personally reviewed and have noted  1. The patient's medical, surgical and social history  2. Their use of alcohol, tobacco or illicit drugs  3. Their current medications and supplements  4. The patient's functional ability including ADL's, fall risks, home safety risks and hearing or visual impairment.  5. Diet and physical activities  6. Evidence for depression or mood disorders 7. The roster of all physicians providing medical care to patient - is listed in the Snapshot section of the chart and reviewed today.    Today patient counseled on age appropriate routine health concerns for screening and prevention, each reviewed and up to date or declined. Immunizations reviewed and up to date or declined. Labs ordered and reviewed. Risk factors for depression reviewed and negative. Hearing function and visual acuity are intact. ADLs screened and addressed as needed. Functional ability and level of safety reviewed and appropriate. Education, counseling and referrals performed based on assessed risks today. Patient provided with a copy of personalized plan for preventive services.    Flex sig  1993. Declined colonoscopy. Cologuard discussed again

## 2016-08-29 NOTE — Progress Notes (Signed)
Subjective:  Patient ID: Brian Mora, male    DOB: 1941-07-21  Age: 76 y.o. MRN: [id number]  CC: No chief complaint on file.   HPI Brian Mora presents for well exam. C/o rash on chest, back - long time, itching  Outpatient Medications Prior to Visit  Medication Sig Dispense Refill  . acetaminophen (TYLENOL) 325 MG tablet Take 2 tablets (650 mg total) by mouth every 6 (six) hours as needed for mild pain (or Fever >/= 101).    Marland Kitchen aspirin EC 325 MG EC tablet 1 tab a day for the next 30 days to prevent blood clots 30 tablet 0  . celecoxib (CELEBREX) 200 MG capsule 1 tab po q day with food for pain and  swelling 30 capsule 0  . Cholecalciferol (VITAMIN D3) 2000 units capsule Take 1 capsule (2,000 Units total) by mouth daily. 100 capsule 3  . docusate sodium (COLACE) 100 MG capsule Take 1 capsule (100 mg total) by mouth 2 (two) times daily. 10 capsule 0  . esomeprazole (NEXIUM) 20 MG capsule Take 20 mg by mouth daily at 12 noon.    . loratadine (CLARITIN) 10 MG tablet Take 10 mg by mouth daily as needed for allergies.    Marland Kitchen OVER THE COUNTER MEDICATION 1 tablet. OTC antacid from [facility]    . oxyCODONE (OXY IR/ROXICODONE) 5 MG immediate release tablet Take 1 tablet (5 mg total) by mouth every 8 (eight) hours as needed for severe pain. 60 tablet 0  . polyethylene glycol (MIRALAX / GLYCOLAX) packet Take 17 g by mouth 2 (two) times daily. 60 each 0  . Simethicone (GAS-X PO) Take 1 tablet by mouth as needed.    . tolterodine (DETROL LA) 4 MG 24 hr capsule Take 1 capsule (4 mg total) by mouth daily. 30 capsule 5   No facility-administered medications prior to visit.     ROS Review of Systems  Constitutional: Negative for appetite change, fatigue and unexpected weight change.  HENT: Negative for congestion, nosebleeds, sneezing, sore throat and trouble swallowing.   Eyes: Negative for itching and visual disturbance.  Respiratory: Negative for cough.   Cardiovascular: Negative for chest  pain, palpitations and leg swelling.  Gastrointestinal: Negative for abdominal distention, blood in stool, diarrhea and nausea.  Genitourinary: Negative for frequency and hematuria.  Musculoskeletal: Negative for back pain, gait problem, joint swelling and neck pain.  Skin: Negative for rash.  Neurological: Negative for dizziness, tremors, speech difficulty and weakness.  Psychiatric/Behavioral: Negative for agitation, dysphoric mood, sleep disturbance and suicidal ideas. The patient is not nervous/anxious.     Objective:  BP 130/88   Pulse 89   Ht 6\' 2"  (1.88 m)   Wt 208 lb (94.3 kg)   SpO2 97%   BMI 26.71 kg/m   BP Readings from Last 3 Encounters:  08/29/16 130/88  07/27/16 123/77  07/25/16 120/72    Wt Readings from Last 3 Encounters:  08/29/16 208 lb (94.3 kg)  07/25/16 214 lb (97.1 kg)  03/24/16 207 lb (93.9 kg)    Physical Exam  Constitutional: He is oriented to person, place, and time. He appears well-developed and well-nourished. No distress.  HENT:  Head: Normocephalic and atraumatic.  Right Ear: External ear normal.  Left Ear: External ear normal.  Nose: Nose normal.  Mouth/Throat: Oropharynx is clear and moist. No oropharyngeal exudate.  Eyes: Conjunctivae and EOM are normal. Pupils are equal, round, and reactive to light. Right eye exhibits no discharge. Left eye exhibits no  discharge. No scleral icterus.  Neck: Normal range of motion. Neck supple. No JVD present. No tracheal deviation present. No thyromegaly present.  Cardiovascular: Normal rate, regular rhythm, normal heart sounds and intact distal pulses.  Exam reveals no gallop and no friction rub.   No murmur heard. Pulmonary/Chest: Effort normal and breath sounds normal. No stridor. No respiratory distress. He has no wheezes. He has no rales. He exhibits no tenderness.  Abdominal: Soft. Bowel sounds are normal. He exhibits no distension and no mass. There is no tenderness. There is no rebound and no  guarding.  Genitourinary: Rectum normal, prostate normal and penis normal. Rectal exam shows guaiac negative stool. No penile tenderness.  Musculoskeletal: Normal range of motion. He exhibits no edema or tenderness.  Lymphadenopathy:    He has no cervical adenopathy.  Neurological: He is alert and oriented to person, place, and time. He has normal reflexes. No cranial nerve deficit. He exhibits normal muscle tone. Coordination normal.  Skin: Skin is warm and dry. No rash noted. He is not diaphoretic. No erythema. No pallor.  Psychiatric: He has a normal mood and affect. His behavior is normal. Judgment and thought content normal.  eczema w/hyperpigm chest, upper back  Lab Results  Component Value Date   WBC 5.9 02/18/2016   HGB 13.5 02/18/2016   HCT 41.4 02/18/2016   PLT 226.0 02/18/2016   GLUCOSE 93 02/18/2016   CHOL 268 (H) 08/12/2015   TRIG 209.0 (H) 08/12/2015   HDL 49.30 08/12/2015   LDLDIRECT 168.0 08/12/2015   ALT 29 02/18/2016   AST 35 02/18/2016   NA 141 02/18/2016   K 4.5 02/18/2016   CL 101 02/18/2016   CREATININE 1.08 02/18/2016   BUN 15 02/18/2016   CO2 32 02/18/2016   TSH 1.39 02/18/2016   PSA 2.76 08/12/2015   INR 1.02 10/29/2014    US Abdomen Complete  Result Date: 03/13/2016 CLINICAL DATA:  Abdominal pain . EXAM: ABDOMEN ULTRASOUND COMPLETE COMPARISON:  None. FINDINGS: Gallbladder: No gallstones or wall thickening visualized. No sonographic Murphy sign noted by sonographer. Common bile duct: Diameter: 5.7 mm Liver: There is echogenic consistent fatty infiltration and/or hepatocellular disease. No focal hepatic abnormality identified. IVC: No abnormality visualized. Pancreas: Visualized portion unremarkable. Spleen: Size and appearance within normal limits. Right Kidney: Length: 12.2 cm. Echogenicity within normal limits. No mass or hydronephrosis visualized. Left Kidney: Length: 11.9 cm. Echogenicity within normal limits. No mass or hydronephrosis visualized.  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 Other findings: None. IMPRESSION: 1. Liver is echogenic consistent fatty infiltration and/or hepatocellular disease . No acute abnormality. 2. 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 Recommend followup by ultrasound in 5 years. This recommendation follows ACR consensus guidelines: White Paper of the ACR Incidental Findings Committee II on Vascular Findings. J Am Coll Radiol 2013; 10:789-794. Electronically Signed   By: Marcello Moores  Register   On: 03/13/2016 12:29    Assessment & Plan:   There are no diagnoses linked to this encounter. I am having Mr. Schneller maintain his loratadine, esomeprazole, Simethicone (GAS-X PO), OVER THE COUNTER MEDICATION, acetaminophen, aspirin, celecoxib, docusate sodium, polyethylene glycol, oxyCODONE, tolterodine, and Vitamin D3.  No orders of the defined types were placed in this encounter.    Follow-up: No Follow-up on file.  Walker Kehr, MD

## 2016-09-01 ENCOUNTER — Other Ambulatory Visit: Payer: Self-pay | Admitting: Internal Medicine

## 2016-09-01 DIAGNOSIS — R972 Elevated prostate specific antigen [PSA]: Secondary | ICD-10-CM

## 2016-10-25 ENCOUNTER — Encounter: Payer: Self-pay | Admitting: Podiatry

## 2016-10-25 ENCOUNTER — Ambulatory Visit (INDEPENDENT_AMBULATORY_CARE_PROVIDER_SITE_OTHER): Payer: Medicare Other | Admitting: Podiatry

## 2016-10-25 VITALS — BP 155/95 | HR 90

## 2016-10-25 DIAGNOSIS — B351 Tinea unguium: Secondary | ICD-10-CM | POA: Diagnosis not present

## 2016-10-25 DIAGNOSIS — M79673 Pain in unspecified foot: Secondary | ICD-10-CM

## 2016-10-25 DIAGNOSIS — M79606 Pain in leg, unspecified: Secondary | ICD-10-CM

## 2016-10-25 NOTE — Patient Instructions (Signed)
Seen for hypertrophic nails. All nails debrided. Return in 3 months or as needed.  

## 2016-10-25 NOTE — Progress Notes (Signed)
Subjective: 76 year old male presents requesting toe nails and calluses trimmed.  Calluses are painful under the both big toes.   Objective: Thick dystrophic nails x 10.  Plantar IPJ callus under great toe bilateral. Hallux valgus with bunion bilateral.  Hyperextended hallux bilateral with IPJ plantar callus, itches bad when gets thick build up.   Assessment: Onychomycosis x 10.  Symptomatic keratosisunder left great toe IPJ plantar surface.  Painful feet.  HAV with bunion bilateral.   Plan: Debrided all nails and calluses.  Return in 3 month or sooner if needed.

## 2016-11-27 ENCOUNTER — Encounter: Payer: Self-pay | Admitting: Internal Medicine

## 2016-11-27 ENCOUNTER — Ambulatory Visit (INDEPENDENT_AMBULATORY_CARE_PROVIDER_SITE_OTHER): Payer: Medicare Other | Admitting: Internal Medicine

## 2016-11-27 DIAGNOSIS — R3915 Urgency of urination: Secondary | ICD-10-CM | POA: Diagnosis not present

## 2016-11-27 DIAGNOSIS — L309 Dermatitis, unspecified: Secondary | ICD-10-CM

## 2016-11-27 DIAGNOSIS — I10 Essential (primary) hypertension: Secondary | ICD-10-CM

## 2016-11-27 MED ORDER — TERAZOSIN HCL 1 MG PO CAPS: 1.0000 mg | ORAL_CAPSULE | Freq: Every day | ORAL | 11 refills | Status: DC

## 2016-11-27 MED ORDER — CELECOXIB 200 MG PO CAPS: ORAL_CAPSULE | ORAL | 3 refills | Status: DC

## 2016-11-27 NOTE — Assessment & Plan Note (Signed)
On Kenalog topically  better

## 2016-11-27 NOTE — Assessment & Plan Note (Signed)
Added Hytrin

## 2016-11-27 NOTE — Progress Notes (Signed)
Pre visit review using our clinic review tool, if applicable. No additional management support is needed unless otherwise documented below in the visit note. 

## 2016-11-27 NOTE — Assessment & Plan Note (Signed)
Hytrin to start

## 2016-11-27 NOTE — Patient Instructions (Signed)
MC well w/Jill 

## 2016-11-27 NOTE — Progress Notes (Signed)
Subjective:  Patient ID: Brian Mora, male    DOB: May 21, 1941  Age: 76 y.o. MRN: [id number]  CC: No chief complaint on file.   HPI Brian Mora presents for eczema, OA, HTN f/u  Outpatient Medications Prior to Visit  Medication Sig Dispense Refill  . acetaminophen (TYLENOL) 325 MG tablet Take 2 tablets (650 mg total) by mouth every 6 (six) hours as needed for mild pain (or Fever >/= 101).    Marland Kitchen aspirin EC 325 MG EC tablet 1 tab a day for the next 30 days to prevent blood clots 30 tablet 0  . celecoxib (CELEBREX) 200 MG capsule 1 tab po q day with food for pain and  swelling 30 capsule 3  . Cholecalciferol (VITAMIN D3) 2000 units capsule Take 1 capsule (2,000 Units total) by mouth daily. 100 capsule 3  . docusate sodium (COLACE) 100 MG capsule Take 1 capsule (100 mg total) by mouth 2 (two) times daily. 10 capsule 0  . esomeprazole (NEXIUM) 20 MG capsule Take 20 mg by mouth daily at 12 noon.    . loratadine (CLARITIN) 10 MG tablet Take 10 mg by mouth daily as needed for allergies.    Marland Kitchen OVER THE COUNTER MEDICATION 1 tablet. OTC antacid from [facility]    . oxyCODONE (OXY IR/ROXICODONE) 5 MG immediate release tablet Take 1 tablet (5 mg total) by mouth every 8 (eight) hours as needed for severe pain. 60 tablet 0  . polyethylene glycol (MIRALAX / GLYCOLAX) packet Take 17 g by mouth 2 (two) times daily. 60 each 0  . Simethicone (GAS-X PO) Take 1 tablet by mouth as needed.    . tolterodine (DETROL LA) 4 MG 24 hr capsule Take 1 capsule (4 mg total) by mouth daily. 30 capsule 11  . triamcinolone ointment (KENALOG) 0.1 % Apply 1 application topically 2 (two) times daily. prn 160 g 3   No facility-administered medications prior to visit.     ROS Review of Systems  Constitutional: Negative for appetite change, fatigue and unexpected weight change.  HENT: Negative for congestion, nosebleeds, sneezing, sore throat and trouble swallowing.   Eyes: Negative for itching and visual disturbance.    Respiratory: Negative for cough.   Cardiovascular: Negative for chest pain, palpitations and leg swelling.  Gastrointestinal: Negative for abdominal distention, blood in stool, diarrhea and nausea.  Genitourinary: Negative for frequency and hematuria.  Musculoskeletal: Positive for arthralgias. Negative for back pain, gait problem, joint swelling and neck pain.  Skin: Negative for rash.  Neurological: Negative for dizziness, tremors, speech difficulty and weakness.  Psychiatric/Behavioral: Negative for agitation, dysphoric mood and sleep disturbance. The patient is not nervous/anxious.     Objective:  BP (!) 144/90 (BP Location: Left Arm, Patient Position: Sitting, Cuff Size: Normal)   Pulse (!) 101   Temp 97.5 F (36.4 C) (Oral)   Ht 6\' 2"  (1.88 m)   Wt 216 lb (98 kg)   SpO2 98%   BMI 27.73 kg/m   BP Readings from Last 3 Encounters:  11/27/16 (!) 144/90  10/25/16 (!) 155/95  08/29/16 130/88    Wt Readings from Last 3 Encounters:  11/27/16 216 lb (98 kg)  08/29/16 208 lb (94.3 kg)  07/25/16 214 lb (97.1 kg)    Physical Exam  Constitutional: He is oriented to person, place, and time. He appears well-developed. No distress.  NAD  HENT:  Mouth/Throat: Oropharynx is clear and moist.  Eyes: Conjunctivae are normal. Pupils are equal, round, and reactive to  light.  Neck: Normal range of motion. No JVD present. No thyromegaly present.  Cardiovascular: Normal rate, regular rhythm, normal heart sounds and intact distal pulses.  Exam reveals no gallop and no friction rub.   No murmur heard. Pulmonary/Chest: Effort normal and breath sounds normal. No respiratory distress. He has no wheezes. He has no rales. He exhibits no tenderness.  Abdominal: Soft. Bowel sounds are normal. He exhibits no distension and no mass. There is no tenderness. There is no rebound and no guarding.  Musculoskeletal: Normal range of motion. He exhibits no edema or tenderness.  Lymphadenopathy:    He has no  cervical adenopathy.  Neurological: He is alert and oriented to person, place, and time. He has normal reflexes. No cranial nerve deficit. He exhibits normal muscle tone. He displays a negative Romberg sign. Coordination and gait normal.  Skin: Skin is warm and dry. Rash noted.  Psychiatric: He has a normal mood and affect. His behavior is normal. Judgment and thought content normal.    Lab Results  Component Value Date   WBC 5.0 08/29/2016   HGB 13.6 08/29/2016   HCT 40.6 08/29/2016   PLT 215.0 08/29/2016   GLUCOSE 105 (H) 08/29/2016   CHOL 246 (H) 08/29/2016   TRIG 157.0 (H) 08/29/2016   HDL 52.80 08/29/2016   LDLDIRECT 168.0 08/12/2015   LDLCALC 161 (H) 08/29/2016   ALT 21 08/29/2016   AST 21 08/29/2016   NA 141 08/29/2016   K 4.1 08/29/2016   CL 104 08/29/2016   CREATININE 1.08 08/29/2016   BUN 7 08/29/2016   CO2 31 08/29/2016   TSH 0.76 08/29/2016   PSA 6.38 (H) 08/29/2016   INR 1.02 10/29/2014    US Abdomen Complete  Result Date: 03/13/2016 CLINICAL DATA:  Abdominal pain . EXAM: ABDOMEN ULTRASOUND COMPLETE COMPARISON:  None. FINDINGS: Gallbladder: No gallstones or wall thickening visualized. No sonographic Murphy sign noted by sonographer. Common bile duct: Diameter: 5.7 mm Liver: There is echogenic consistent fatty infiltration and/or hepatocellular disease. No focal hepatic abnormality identified. IVC: No abnormality visualized. Pancreas: Visualized portion unremarkable. Spleen: Size and appearance within normal limits. Right Kidney: Length: 12.2 cm. Echogenicity within normal limits. No mass or hydronephrosis visualized. Left Kidney: Length: 11.9 cm. Echogenicity within normal limits. No mass or hydronephrosis visualized. 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 Other findings: None. IMPRESSION: 1. Liver is echogenic consistent fatty infiltration and/or hepatocellular disease . No acute abnormality. 2. 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 Ectatic abdominal  aorta at risk for aneurysm development. Recommend followup by ultrasound in 5 years. This recommendation follows ACR consensus guidelines: White Paper of the ACR Incidental Findings Committee II on Vascular Findings. J Am Coll Radiol 2013; 10:789-794. Electronically Signed   By: Marcello Moores  Register   On: 03/13/2016 12:29    Assessment & Plan:   There are no diagnoses linked to this encounter. I am having Mr. Mallek maintain his loratadine, esomeprazole, Simethicone (GAS-X PO), OVER THE COUNTER MEDICATION, acetaminophen, aspirin, docusate sodium, polyethylene glycol, oxyCODONE, tolterodine, celecoxib, Vitamin D3, and triamcinolone ointment.  No orders of the defined types were placed in this encounter.    Follow-up: No Follow-up on file.  Walker Kehr, MD

## 2017-01-24 ENCOUNTER — Ambulatory Visit (INDEPENDENT_AMBULATORY_CARE_PROVIDER_SITE_OTHER): Payer: Medicare Other | Admitting: Podiatry

## 2017-01-24 ENCOUNTER — Encounter: Payer: Self-pay | Admitting: Podiatry

## 2017-01-24 VITALS — BP 129/81 | HR 106 | Ht 74.0 in | Wt 208.0 lb

## 2017-01-24 DIAGNOSIS — B351 Tinea unguium: Secondary | ICD-10-CM

## 2017-01-24 DIAGNOSIS — M79606 Pain in leg, unspecified: Secondary | ICD-10-CM | POA: Diagnosis not present

## 2017-01-24 NOTE — Progress Notes (Signed)
Subjective: 75year old male presents requesting toe nails and calluses trimmed. He has to trim the big toes himself.  Calluses are painful under the both big toes. Patient wants the left foot bunion corrected.   Objective: Thick dystrophic nails x 10.  Plantar IPJ callus under great toe bilateral. Hallux valgus with bunion bilateral.  Hyperextended hallux bilateral with IPJ plantar callus, itches bad when gets thick build up.   Assessment: Onychomycosis x 10.  Symptomatic keratosisunder left great toe IPJ plantar surface.  Painful feet.  HAV with bunion left foot painful. Plan: Debrided all nails and calluses.  Reviewed surgical option on left foot bunion. Return in 3 month or sooner if needed. 

## 2017-01-24 NOTE — Patient Instructions (Signed)
Seen for hypertrophic nails and calluses. All nails and calluses debrided. Return in 3 months or as needed.  

## 2017-01-25 ENCOUNTER — Ambulatory Visit: Payer: Medicare Other | Admitting: Podiatry

## 2017-02-26 ENCOUNTER — Ambulatory Visit (INDEPENDENT_AMBULATORY_CARE_PROVIDER_SITE_OTHER): Payer: Medicare Other | Admitting: Internal Medicine

## 2017-02-26 ENCOUNTER — Other Ambulatory Visit (INDEPENDENT_AMBULATORY_CARE_PROVIDER_SITE_OTHER): Payer: Medicare Other

## 2017-02-26 ENCOUNTER — Encounter: Payer: Self-pay | Admitting: Internal Medicine

## 2017-02-26 DIAGNOSIS — R7309 Other abnormal glucose: Secondary | ICD-10-CM | POA: Diagnosis not present

## 2017-02-26 DIAGNOSIS — I10 Essential (primary) hypertension: Secondary | ICD-10-CM | POA: Diagnosis not present

## 2017-02-26 DIAGNOSIS — R3915 Urgency of urination: Secondary | ICD-10-CM

## 2017-02-26 DIAGNOSIS — E782 Mixed hyperlipidemia: Secondary | ICD-10-CM

## 2017-02-26 DIAGNOSIS — R972 Elevated prostate specific antigen [PSA]: Secondary | ICD-10-CM | POA: Diagnosis not present

## 2017-02-26 DIAGNOSIS — K219 Gastro-esophageal reflux disease without esophagitis: Secondary | ICD-10-CM

## 2017-02-26 LAB — BASIC METABOLIC PANEL
BUN: 10 mg/dL (ref 6–23)
CALCIUM: 9.6 mg/dL (ref 8.4–10.5)
CO2: 28 meq/L (ref 19–32)
CREATININE: 1.08 mg/dL (ref 0.40–1.50)
Chloride: 101 mEq/L (ref 96–112)
GFR: 85.57 mL/min (ref >60.00)
GLUCOSE: 170 mg/dL — AB (ref 70–99)
Potassium: 4.2 mEq/L (ref 3.5–5.1)
Sodium: 137 mEq/L (ref 135–145)

## 2017-02-26 MED ORDER — CELECOXIB 200 MG PO CAPS: ORAL_CAPSULE | ORAL | 3 refills | Status: DC

## 2017-02-26 MED ORDER — TERAZOSIN HCL 1 MG PO CAPS: 1.0000 mg | ORAL_CAPSULE | Freq: Every day | ORAL | 11 refills | Status: DC

## 2017-02-26 NOTE — Assessment & Plan Note (Signed)
Nex[employer] 

## 2017-02-26 NOTE — Progress Notes (Signed)
Subjective:  Patient ID: Brian Mora, male    DOB: 16-Jul-1941  Age: 76 y.o. MRN: [id number]  CC: No chief complaint on file.   HPI Brian Mora presents for GERD, HTN, dyslipidemia, elevated glu f/u  Outpatient Medications Prior to Visit  Medication Sig Dispense Refill  . acetaminophen (TYLENOL) 325 MG tablet Take 2 tablets (650 mg total) by mouth every 6 (six) hours as needed for mild pain (or Fever >/= 101).    Marland Kitchen aspirin EC 325 MG EC tablet 1 tab a day for the next 30 days to prevent blood clots 30 tablet 0  . celecoxib (CELEBREX) 200 MG capsule 1 tab po q day with food for pain and  arthritis 30 capsule 3  . Cholecalciferol (VITAMIN D3) 2000 units capsule Take 1 capsule (2,000 Units total) by mouth daily. 100 capsule 3  . docusate sodium (COLACE) 100 MG capsule Take 1 capsule (100 mg total) by mouth 2 (two) times daily. 10 capsule 0  . esomeprazole (NEXIUM) 20 MG capsule Take 20 mg by mouth daily at 12 noon.    . loratadine (CLARITIN) 10 MG tablet Take 10 mg by mouth daily as needed for allergies.    Marland Kitchen OVER THE COUNTER MEDICATION 1 tablet. OTC antacid from [facility]    . polyethylene glycol (MIRALAX / GLYCOLAX) packet Take 17 g by mouth 2 (two) times daily. 60 each 0  . Simethicone (GAS-X PO) Take 1 tablet by mouth as needed.    . terazosin (HYTRIN) 1 MG capsule Take 1 capsule (1 mg total) by mouth at bedtime. 30 capsule 11  . tolterodine (DETROL LA) 4 MG 24 hr capsule Take 1 capsule (4 mg total) by mouth daily. 30 capsule 11  . triamcinolone ointment (KENALOG) 0.1 % Apply 1 application topically 2 (two) times daily. prn 160 g 3   No facility-administered medications prior to visit.     ROS Review of Systems  Constitutional: Negative for appetite change, fatigue and unexpected weight change.  HENT: Negative for congestion, nosebleeds, sneezing, sore throat and trouble swallowing.   Eyes: Negative for itching and visual disturbance.  Respiratory: Negative for cough.     Cardiovascular: Negative for chest pain, palpitations and leg swelling.  Gastrointestinal: Negative for abdominal distention, blood in stool, diarrhea and nausea.  Genitourinary: Negative for frequency and hematuria.  Musculoskeletal: Positive for arthralgias and back pain. Negative for gait problem, joint swelling and neck pain.  Skin: Negative for rash.  Neurological: Negative for dizziness, tremors, speech difficulty and weakness.  Psychiatric/Behavioral: Negative for agitation, dysphoric mood and sleep disturbance. The patient is not nervous/anxious.     Objective:  BP 116/80 (BP Location: Left Arm, Patient Position: Sitting, Cuff Size: Normal)   Pulse (!) 113   Temp 97.6 F (36.4 C) (Oral)   Ht 6\' 2"  (1.88 m)   Wt 218 lb (98.9 kg)   SpO2 100%   BMI 27.99 kg/m   BP Readings from Last 3 Encounters:  02/26/17 116/80  01/24/17 129/81  11/27/16 (!) 144/90    Wt Readings from Last 3 Encounters:  02/26/17 218 lb (98.9 kg)  01/24/17 208 lb (94.3 kg)  11/27/16 216 lb (98 kg)    Physical Exam  Constitutional: He is oriented to person, place, and time. He appears well-developed. No distress.  NAD  HENT:  Mouth/Throat: Oropharynx is clear and moist.  Eyes: Pupils are equal, round, and reactive to light. Conjunctivae are normal.  Neck: Normal range of motion. No JVD  present. No thyromegaly present.  Cardiovascular: Normal rate, regular rhythm, normal heart sounds and intact distal pulses.  Exam reveals no gallop and no friction rub.   No murmur heard. Pulmonary/Chest: Effort normal and breath sounds normal. No respiratory distress. He has no wheezes. He has no rales. He exhibits no tenderness.  Abdominal: Soft. Bowel sounds are normal. He exhibits no distension and no mass. There is no tenderness. There is no rebound and no guarding.  Musculoskeletal: Normal range of motion. He exhibits tenderness. He exhibits no edema.  Lymphadenopathy:    He has no cervical adenopathy.   Neurological: He is alert and oriented to person, place, and time. He has normal reflexes. No cranial nerve deficit. He exhibits normal muscle tone. He displays a negative Romberg sign. Coordination and gait normal.  Skin: Skin is warm and dry. No rash noted.  Psychiatric: He has a normal mood and affect. His behavior is normal. Judgment and thought content normal.    Lab Results  Component Value Date   WBC 5.0 08/29/2016   HGB 13.6 08/29/2016   HCT 40.6 08/29/2016   PLT 215.0 08/29/2016   GLUCOSE 105 (H) 08/29/2016   CHOL 246 (H) 08/29/2016   TRIG 157.0 (H) 08/29/2016   HDL 52.80 08/29/2016   LDLDIRECT 168.0 08/12/2015   LDLCALC 161 (H) 08/29/2016   ALT 21 08/29/2016   AST 21 08/29/2016   NA 141 08/29/2016   K 4.1 08/29/2016   CL 104 08/29/2016   CREATININE 1.08 08/29/2016   BUN 7 08/29/2016   CO2 31 08/29/2016   TSH 0.76 08/29/2016   PSA 6.38 (H) 08/29/2016   INR 1.02 10/29/2014    US Abdomen Complete  Result Date: 03/13/2016 CLINICAL DATA:  Abdominal pain . EXAM: ABDOMEN ULTRASOUND COMPLETE COMPARISON:  None. FINDINGS: Gallbladder: No gallstones or wall thickening visualized. No sonographic Murphy sign noted by sonographer. Common bile duct: Diameter: 5.7 mm Liver: There is echogenic consistent fatty infiltration and/or hepatocellular disease. No focal hepatic abnormality identified. IVC: No abnormality visualized. Pancreas: Visualized portion unremarkable. Spleen: Size and appearance within normal limits. Right Kidney: Length: 12.2 cm. Echogenicity within normal limits. No mass or hydronephrosis visualized. Left Kidney: Length: 11.9 cm. Echogenicity within normal limits. No mass or hydronephrosis visualized. 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 Other findings: None. IMPRESSION: 1. Liver is echogenic consistent fatty infiltration and/or hepatocellular disease . No acute abnormality. 2. 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 Ectatic abdominal aorta at risk for  aneurysm development. Recommend followup by ultrasound in 5 years. This recommendation follows ACR consensus guidelines: White Paper of the ACR Incidental Findings Committee II on Vascular Findings. J Am Coll Radiol 2013; 10:789-794. Electronically Signed   By: Brian Moores  Register   On: 03/13/2016 12:29    Assessment & Plan:   There are no diagnoses linked to this encounter. I am having Mr. Kopecky maintain his loratadine, esomeprazole, Simethicone (GAS-X PO), OVER THE COUNTER MEDICATION, acetaminophen, aspirin, docusate sodium, polyethylene glycol, tolterodine, Vitamin D3, triamcinolone ointment, celecoxib, and terazosin.  No orders of the defined types were placed in this encounter.    Follow-up: No Follow-up on file.  Walker Kehr, MD

## 2017-02-26 NOTE — Assessment & Plan Note (Signed)
Stop [employer], sweet tea

## 2017-02-26 NOTE — Assessment & Plan Note (Signed)
Statin intolerant 

## 2017-02-26 NOTE — Assessment & Plan Note (Signed)
On Hytrin 

## 2017-02-27 LAB — PSA, TOTAL AND FREE
PSA, % Free: 10 % — ABNORMAL LOW (ref >25)
PSA, FREE: 0.7 ng/mL
PSA, TOTAL: 7.2 ng/mL — AB (ref <=4.0)

## 2017-04-13 IMAGING — US US ABDOMEN COMPLETE
1 series · 13 of 25 positions shown · non-contrast
Comparison: None.

CLINICAL DATA: Abdominal pain .

EXAM:
ABDOMEN ULTRASOUND COMPLETE

[Series 1: us abdomen complete · 0.23mm/px · 13 of 84 slices shown]
[im 1/84]
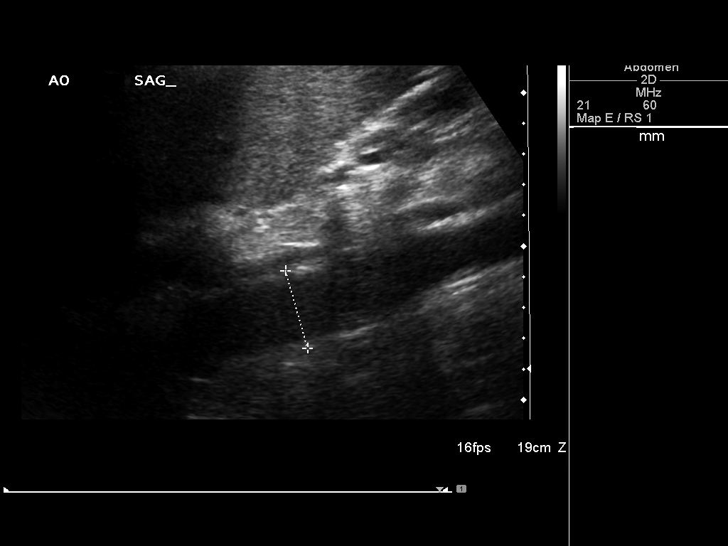
[im 7/84]
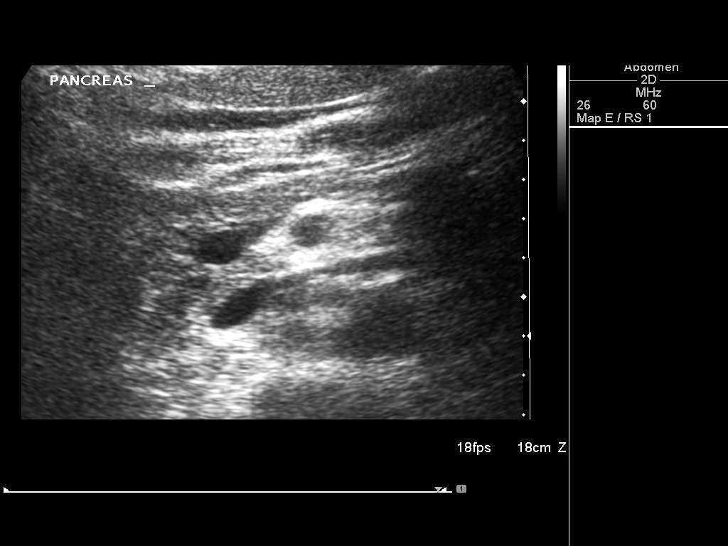
[im 14/84]
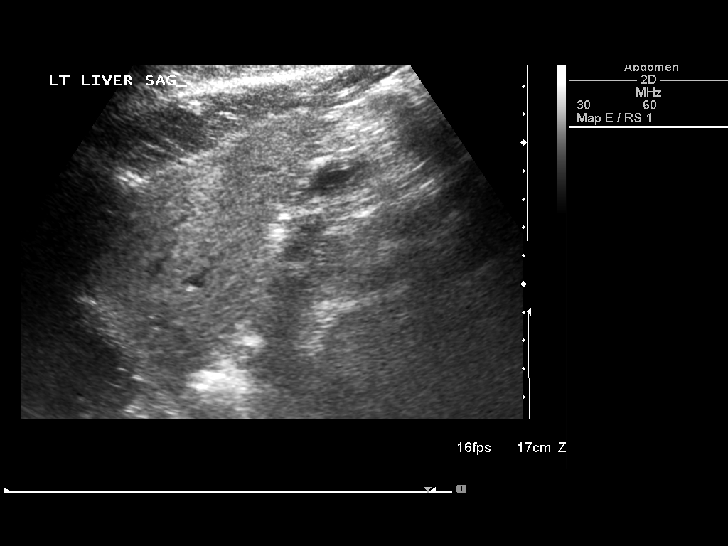
[im 21/84]
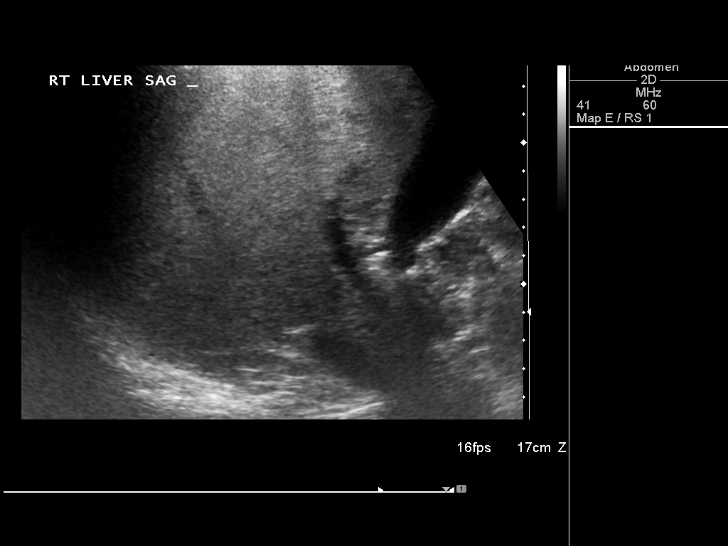
[im 28/84]
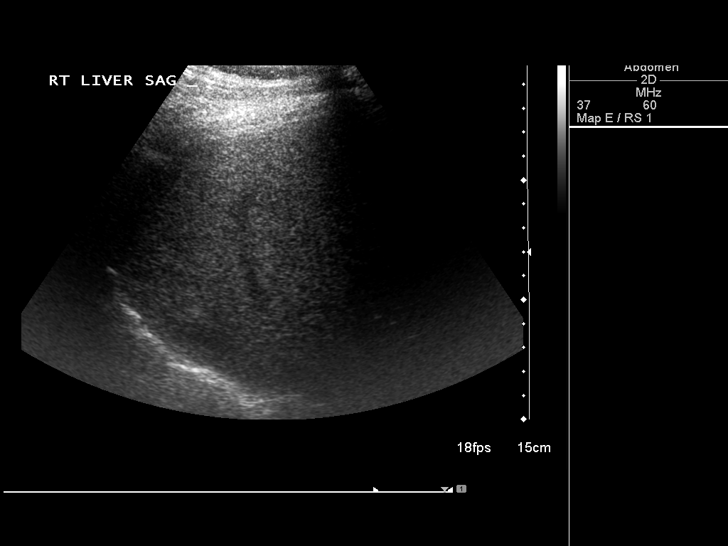
[im 35/84]
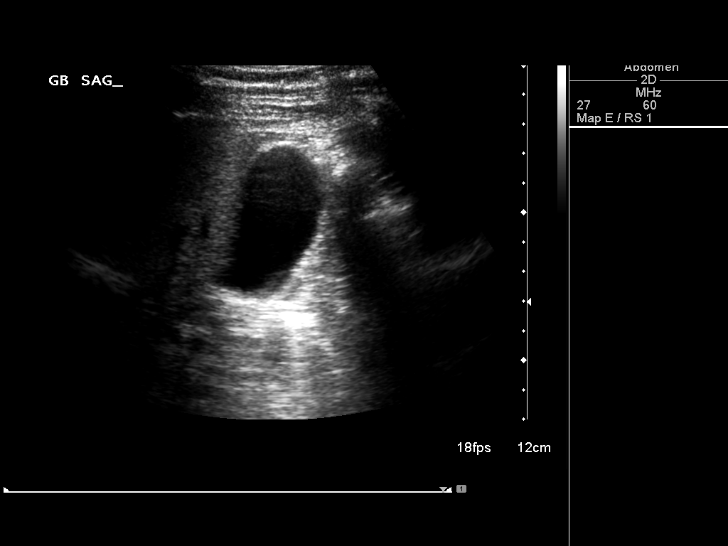
[im 42/84]
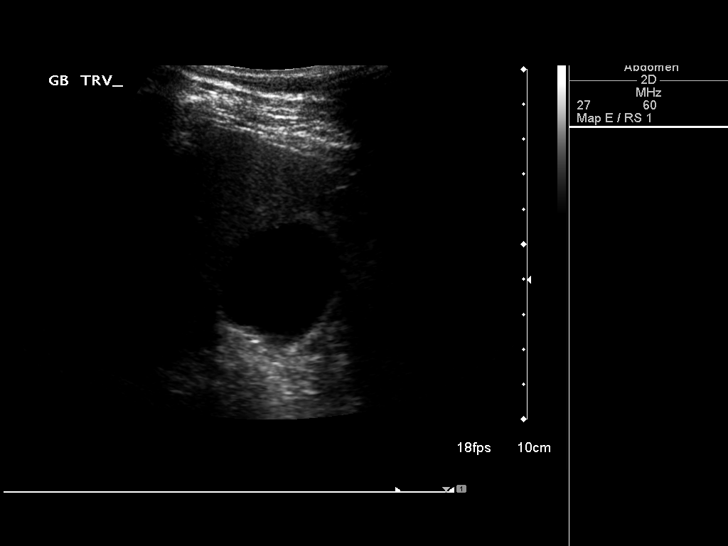
[im 49/84]
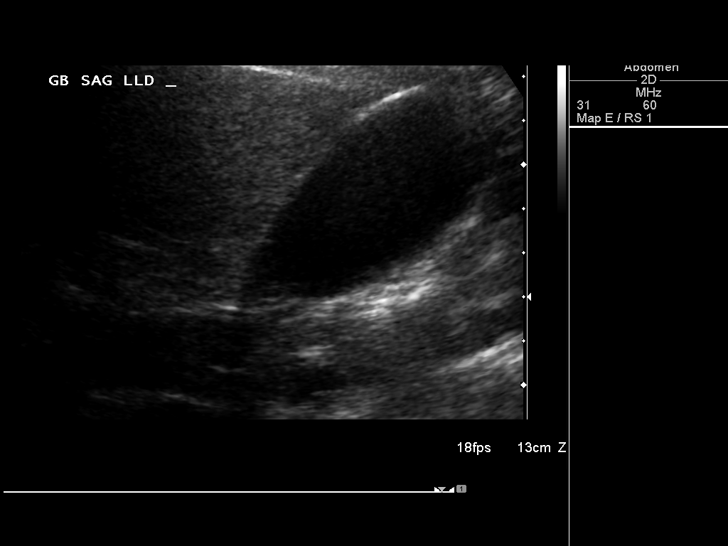
[im 56/84]
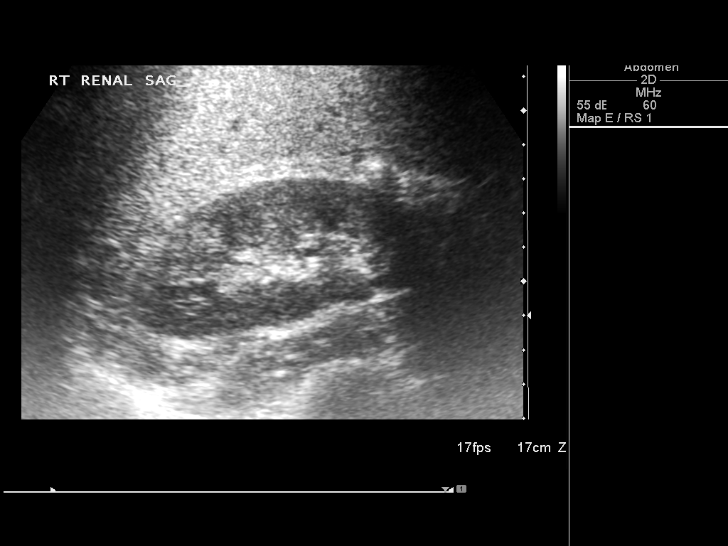
[im 63/84]
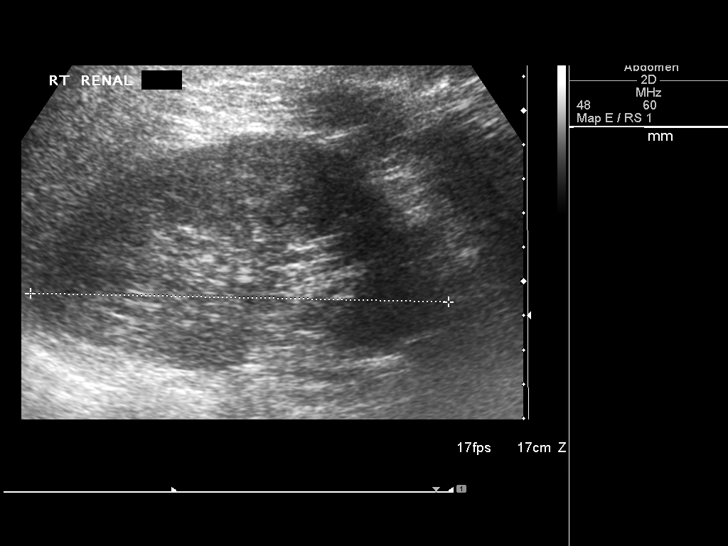
[im 70/84]
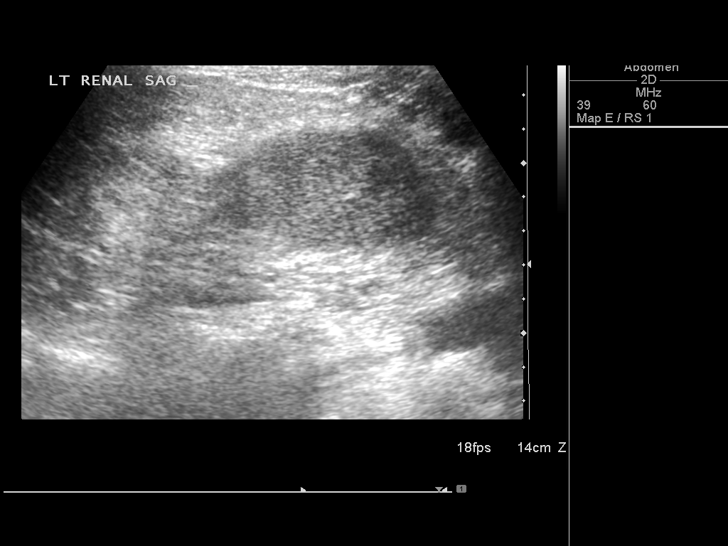
[im 77/84]
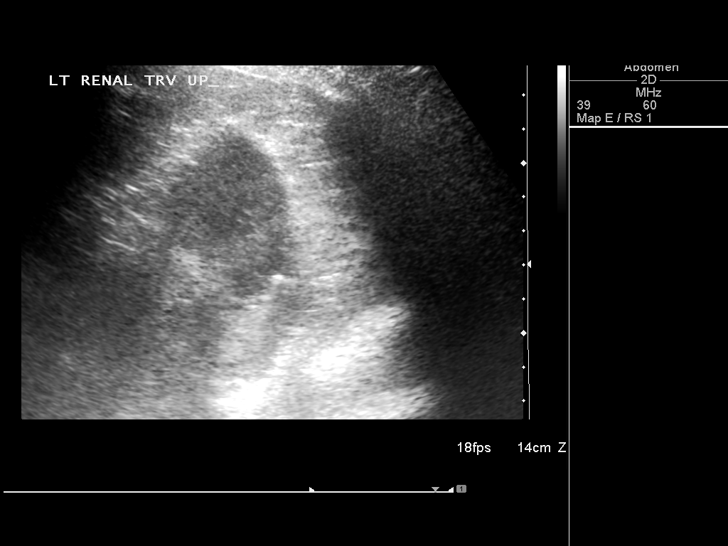
[im 84/84]
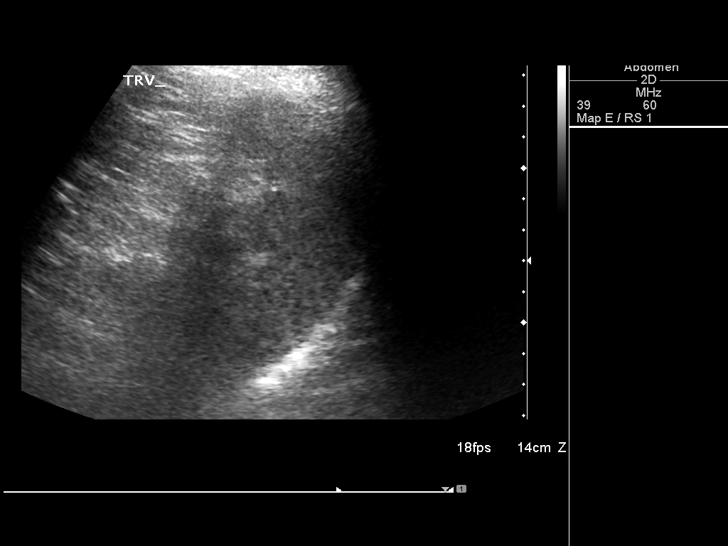

[13 of 25 positions shown; findings below may reference images not displayed]

FINDINGS: Gallbladder: No gallstones or wall thickening visualized. No
sonographic Murphy sign noted by sonographer.

Common bile duct: Diameter: 5.7 mm

Liver: There is echogenic consistent fatty infiltration and/or
hepatocellular disease. No focal hepatic abnormality identified.

IVC: No abnormality visualized.

Pancreas: Visualized portion unremarkable.

Spleen: Size and appearance within normal limits.

Right Kidney: Length: 12.2 cm. Echogenicity within normal limits. No
mass or hydronephrosis visualized.

Left Kidney: Length: 11.9 cm. Echogenicity within normal limits. No
mass or hydronephrosis visualized.

Abdominal aorta: Mild abdominal aortic ectasia 2.6 cm.

Other findings: None.
IMPRESSION: 1. Liver is echogenic consistent fatty infiltration and/or
hepatocellular disease . No acute abnormality.

2. Mild abdominal aortic ectasia 2.6 cm. Ectatic abdominal aorta at
risk for aneurysm development. Recommend followup by ultrasound in 5
years. This recommendation follows ACR consensus guidelines: White
Paper of the ACR Incidental Findings Committee II on Vascular
Findings. [HOSPITAL] 4265; [DATE].

## 2017-04-26 ENCOUNTER — Ambulatory Visit (INDEPENDENT_AMBULATORY_CARE_PROVIDER_SITE_OTHER): Payer: Medicare Other | Admitting: Podiatry

## 2017-04-26 ENCOUNTER — Encounter: Payer: Self-pay | Admitting: Podiatry

## 2017-04-26 VITALS — BP 147/88 | HR 83

## 2017-04-26 DIAGNOSIS — B351 Tinea unguium: Secondary | ICD-10-CM | POA: Diagnosis not present

## 2017-04-26 DIAGNOSIS — M79606 Pain in leg, unspecified: Secondary | ICD-10-CM | POA: Diagnosis not present

## 2017-04-26 NOTE — Patient Instructions (Signed)
Seen for hypertrophic nails. All nails debrided. Return in 3 months or as needed.  

## 2017-04-26 NOTE — Progress Notes (Signed)
Subjective: 76year old male presents requesting toe nails and calluses trimmed. He has to trim the big toes himself.  Calluses are painful under the both big toes. Patient wants the left foot bunion corrected.   Objective: Thick dystrophic nails x 10.  Plantar IPJ callus under great toe bilateral. Hallux valgus with bunion bilateral.  Hyperextended hallux bilateral with IPJ plantar callus, itches bad when gets thick build up.   Assessment: Onychomycosis x 10.  Symptomatic keratosisunder left great toe IPJ plantar surface.  Painful feet.  HAV with bunion left foot painful. Plan: Debrided all nails and calluses.  Reviewed surgical option on left foot bunion. Return in 3 month or sooner if needed.

## 2017-06-06 ENCOUNTER — Encounter: Payer: Self-pay | Admitting: Internal Medicine

## 2017-06-06 ENCOUNTER — Ambulatory Visit (INDEPENDENT_AMBULATORY_CARE_PROVIDER_SITE_OTHER): Payer: Medicare Other | Admitting: Internal Medicine

## 2017-06-06 VITALS — BP 124/76 | HR 91 | Temp 98.1°F | Ht 74.0 in | Wt 221.0 lb

## 2017-06-06 DIAGNOSIS — R972 Elevated prostate specific antigen [PSA]: Secondary | ICD-10-CM

## 2017-06-06 DIAGNOSIS — R7309 Other abnormal glucose: Secondary | ICD-10-CM | POA: Diagnosis not present

## 2017-06-06 DIAGNOSIS — E782 Mixed hyperlipidemia: Secondary | ICD-10-CM | POA: Diagnosis not present

## 2017-06-06 DIAGNOSIS — I10 Essential (primary) hypertension: Secondary | ICD-10-CM | POA: Diagnosis not present

## 2017-06-06 NOTE — Patient Instructions (Signed)
[facility] well w/Brian Mora 

## 2017-06-06 NOTE — Assessment & Plan Note (Signed)
Hytrin 

## 2017-06-06 NOTE — Assessment & Plan Note (Signed)
Statin intolerant 

## 2017-06-06 NOTE — Progress Notes (Signed)
Subjective:  Patient ID: Brian Mora, male    DOB: 1941-06-25  Age: 76 y.o. MRN: [id number]  CC: No chief complaint on file.   HPI VERNA HAMON presents for wt gain, OA, elevated glucose BPH f/u  Outpatient Medications Prior to Visit  Medication Sig Dispense Refill  . acetaminophen (TYLENOL) 325 MG tablet Take 2 tablets (650 mg total) by mouth every 6 (six) hours as needed for mild pain (or Fever >/= 101).    Marland Kitchen aspirin EC 325 MG EC tablet 1 tab a day for the next 30 days to prevent blood clots 30 tablet 0  . celecoxib (CELEBREX) 200 MG capsule 1 tab po q day with food for pain and  arthritis 30 capsule 3  . Cholecalciferol (VITAMIN D3) 2000 units capsule Take 1 capsule (2,000 Units total) by mouth daily. 100 capsule 3  . docusate sodium (COLACE) 100 MG capsule Take 1 capsule (100 mg total) by mouth 2 (two) times daily. 10 capsule 0  . esomeprazole (NEXIUM) 20 MG capsule Take 20 mg by mouth daily at 12 noon.    . loratadine (CLARITIN) 10 MG tablet Take 10 mg by mouth daily as needed for allergies.    Marland Kitchen OVER THE COUNTER MEDICATION 1 tablet. OTC antacid from [facility]    . polyethylene glycol (MIRALAX / GLYCOLAX) packet Take 17 g by mouth 2 (two) times daily. 60 each 0  . Simethicone (GAS-X PO) Take 1 tablet by mouth as needed.    . terazosin (HYTRIN) 1 MG capsule Take 1 capsule (1 mg total) by mouth at bedtime. 30 capsule 11  . tolterodine (DETROL LA) 4 MG 24 hr capsule Take 1 capsule (4 mg total) by mouth daily. 30 capsule 11  . triamcinolone ointment (KENALOG) 0.1 % Apply 1 application topically 2 (two) times daily. prn 160 g 3   No facility-administered medications prior to visit.     ROS Review of Systems  Constitutional: Positive for unexpected weight change. Negative for appetite change and fatigue.  HENT: Negative for congestion, nosebleeds, sneezing, sore throat and trouble swallowing.   Eyes: Negative for itching and visual disturbance.  Respiratory: Negative for  cough.   Cardiovascular: Negative for chest pain, palpitations and leg swelling.  Gastrointestinal: Negative for abdominal distention, blood in stool, diarrhea and nausea.  Genitourinary: Negative for frequency and hematuria.  Musculoskeletal: Negative for back pain, gait problem, joint swelling and neck pain.  Skin: Negative for rash.  Neurological: Negative for dizziness, tremors, speech difficulty and weakness.  Psychiatric/Behavioral: Negative for agitation, dysphoric mood and sleep disturbance. The patient is not nervous/anxious.     Objective:  BP 124/76 (BP Location: Left Arm, Patient Position: Sitting, Cuff Size: Large)   Pulse 91   Temp 98.1 F (36.7 C) (Oral)   Ht 6\' 2"  (1.88 m)   Wt 221 lb (100.2 kg)   SpO2 98%   BMI 28.37 kg/m   BP Readings from Last 3 Encounters:  06/06/17 124/76  04/26/17 (!) 147/88  02/26/17 116/80    Wt Readings from Last 3 Encounters:  06/06/17 221 lb (100.2 kg)  02/26/17 218 lb (98.9 kg)  01/24/17 208 lb (94.3 kg)    Physical Exam  Constitutional: He is oriented to person, place, and time. He appears well-developed. No distress.  NAD  HENT:  Mouth/Throat: Oropharynx is clear and moist.  Eyes: Pupils are equal, round, and reactive to light. Conjunctivae are normal.  Neck: Normal range of motion. No JVD present. No thyromegaly  present.  Cardiovascular: Normal rate, regular rhythm, normal heart sounds and intact distal pulses.  Exam reveals no gallop and no friction rub.   No murmur heard. Pulmonary/Chest: Effort normal and breath sounds normal. No respiratory distress. He has no wheezes. He has no rales. He exhibits no tenderness.  Abdominal: Soft. Bowel sounds are normal. He exhibits no distension and no mass. There is no tenderness. There is no rebound and no guarding.  Musculoskeletal: Normal range of motion. He exhibits no edema or tenderness.  Lymphadenopathy:    He has no cervical adenopathy.  Neurological: He is alert and oriented  to person, place, and time. He has normal reflexes. No cranial nerve deficit. He exhibits normal muscle tone. He displays a negative Romberg sign. Coordination and gait normal.  Skin: Skin is warm and dry. No rash noted.  Psychiatric: He has a normal mood and affect. His behavior is normal. Judgment and thought content normal.  obese  Lab Results  Component Value Date   WBC 5.0 08/29/2016   HGB 13.6 08/29/2016   HCT 40.6 08/29/2016   PLT 215.0 08/29/2016   GLUCOSE 170 (H) 02/26/2017   CHOL 246 (H) 08/29/2016   TRIG 157.0 (H) 08/29/2016   HDL 52.80 08/29/2016   LDLDIRECT 168.0 08/12/2015   LDLCALC 161 (H) 08/29/2016   ALT 21 08/29/2016   AST 21 08/29/2016   NA 137 02/26/2017   K 4.2 02/26/2017   CL 101 02/26/2017   CREATININE 1.08 02/26/2017   BUN 10 02/26/2017   CO2 28 02/26/2017   TSH 0.76 08/29/2016   PSA 6.38 (H) 08/29/2016   INR 1.02 10/29/2014    US Abdomen Complete  Result Date: 03/13/2016 CLINICAL DATA:  Abdominal pain . EXAM: ABDOMEN ULTRASOUND COMPLETE COMPARISON:  None. FINDINGS: Gallbladder: No gallstones or wall thickening visualized. No sonographic Murphy sign noted by sonographer. Common bile duct: Diameter: 5.7 mm Liver: There is echogenic consistent fatty infiltration and/or hepatocellular disease. No focal hepatic abnormality identified. IVC: No abnormality visualized. Pancreas: Visualized portion unremarkable. Spleen: Size and appearance within normal limits. Right Kidney: Length: 12.2 cm. Echogenicity within normal limits. No mass or hydronephrosis visualized. Left Kidney: Length: 11.9 cm. Echogenicity within normal limits. No mass or hydronephrosis visualized. 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 Other findings: None. IMPRESSION: 1. Liver is echogenic consistent fatty infiltration and/or hepatocellular disease . No acute abnormality. 2. 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 Recommend  followup by ultrasound in 5 years. This recommendation follows ACR consensus guidelines: White Paper of the ACR Incidental Findings Committee II on Vascular Findings. J Am Coll Radiol 2013; 10:789-794. Electronically Signed   By: Marcello Moores  Register   On: 03/13/2016 12:29    Assessment & Plan:   There are no diagnoses linked to this encounter. I am having Mr. Karel maintain his loratadine, esomeprazole, Simethicone (GAS-X PO), OVER THE COUNTER MEDICATION, acetaminophen, aspirin, docusate sodium, polyethylene glycol, tolterodine, Vitamin D3, triamcinolone ointment, celecoxib, and terazosin.  No orders of the defined types were placed in this encounter.    Follow-up: No Follow-up on file.  Walker Kehr, MD

## 2017-06-06 NOTE — Assessment & Plan Note (Signed)
Re-check labs Urol ref was adviced

## 2017-06-06 NOTE — Assessment & Plan Note (Signed)
Sweet green tea - needs to stop

## 2017-07-26 ENCOUNTER — Encounter: Payer: Self-pay | Admitting: Podiatry

## 2017-07-26 ENCOUNTER — Ambulatory Visit: Payer: Medicare Other | Admitting: Podiatry

## 2017-07-26 DIAGNOSIS — M79606 Pain in leg, unspecified: Secondary | ICD-10-CM | POA: Diagnosis not present

## 2017-07-26 DIAGNOSIS — B351 Tinea unguium: Secondary | ICD-10-CM

## 2017-07-26 NOTE — Patient Instructions (Signed)
Seen for hypertrophic nails and calluses. All nails and calluses debrided. Return in 3 months or as needed.  

## 2017-07-26 NOTE — Progress Notes (Signed)
Subjective: 76 y.o. year old male patient presents complaining of painful nails. Patient requests toe nails, corns and calluses trimmed.   Objective: Dermatologic: Thick yellow deformed nails x 10. Plantar callus IPJ both great toes. Vascular: Pedal pulses are all palpable. Orthopedic: Severe hallux valgus with bunion, left foot painful. Hyperextended IPJ of both great toe. Neurologic: All epicritic and tactile sensations grossly intact.  Assessment: Dystrophic mycotic nails x 10. Painful feet.  Treatment: All mycotic nails, corns, calluses debrided.  Return in 3 months or as needed.

## 2017-09-06 ENCOUNTER — Encounter: Payer: Self-pay | Admitting: Internal Medicine

## 2017-09-06 ENCOUNTER — Ambulatory Visit: Payer: Medicare Other | Admitting: Internal Medicine

## 2017-09-06 DIAGNOSIS — I10 Essential (primary) hypertension: Secondary | ICD-10-CM

## 2017-09-06 DIAGNOSIS — L309 Dermatitis, unspecified: Secondary | ICD-10-CM | POA: Diagnosis not present

## 2017-09-06 DIAGNOSIS — M1991 Primary osteoarthritis, unspecified site: Secondary | ICD-10-CM

## 2017-09-06 MED ORDER — TRAMADOL HCL 50 MG PO TABS: 50.0000 mg | ORAL_TABLET | Freq: Three times a day (TID) | ORAL | 0 refills | Status: DC | PRN

## 2017-09-06 MED ORDER — TRIAMCINOLONE 0.1 % CREAM:EUCERIN CREAM 1:1: 1.0000 "application " | TOPICAL_CREAM | Freq: Two times a day (BID) | CUTANEOUS | 3 refills | Status: DC | PRN

## 2017-09-06 NOTE — Progress Notes (Signed)
Subjective:  Patient ID: Brian Mora, male    DOB: 1941/03/04  Age: 77 y.o. MRN: [id number]  CC: No chief complaint on file.   HPI Brian Mora presents for dry skin on the back. C/o OA pains - Celebrex not helping  Outpatient Medications Prior to Visit  Medication Sig Dispense Refill  . acetaminophen (TYLENOL) 325 MG tablet Take 2 tablets (650 mg total) by mouth every 6 (six) hours as needed for mild pain (or Fever >/= 101).    Marland Kitchen aspirin EC 325 MG EC tablet 1 tab a day for the next 30 days to prevent blood clots 30 tablet 0  . celecoxib (CELEBREX) 200 MG capsule 1 tab po q day with food for pain and  arthritis 30 capsule 3  . Cholecalciferol (VITAMIN D3) 2000 units capsule Take 1 capsule (2,000 Units total) by mouth daily. 100 capsule 3  . docusate sodium (COLACE) 100 MG capsule Take 1 capsule (100 mg total) by mouth 2 (two) times daily. 10 capsule 0  . esomeprazole (NEXIUM) 20 MG capsule Take 20 mg by mouth daily at 12 noon.    . loratadine (CLARITIN) 10 MG tablet Take 10 mg by mouth daily as needed for allergies.    Marland Kitchen OVER THE COUNTER MEDICATION 1 tablet. OTC antacid from [facility]    . polyethylene glycol (MIRALAX / GLYCOLAX) packet Take 17 g by mouth 2 (two) times daily. 60 each 0  . Simethicone (GAS-X PO) Take 1 tablet by mouth as needed.    . terazosin (HYTRIN) 1 MG capsule Take 1 capsule (1 mg total) by mouth at bedtime. 30 capsule 11  . tolterodine (DETROL LA) 4 MG 24 hr capsule Take 1 capsule (4 mg total) by mouth daily. 30 capsule 11  . triamcinolone ointment (KENALOG) 0.1 % Apply 1 application topically 2 (two) times daily. prn 160 g 3   No facility-administered medications prior to visit.     ROS Review of Systems  Constitutional: Negative for appetite change, fatigue and unexpected weight change.  HENT: Negative for congestion, nosebleeds, sneezing, sore throat and trouble swallowing.   Eyes: Negative for itching and visual disturbance.  Respiratory: Negative  for cough.   Cardiovascular: Negative for chest pain, palpitations and leg swelling.  Gastrointestinal: Negative for abdominal distention, blood in stool, diarrhea and nausea.  Genitourinary: Negative for frequency and hematuria.  Musculoskeletal: Positive for arthralgias. Negative for back pain, gait problem, joint swelling and neck pain.  Skin: Negative for rash.  Neurological: Negative for dizziness, tremors, speech difficulty and weakness.  Psychiatric/Behavioral: Negative for agitation, dysphoric mood and sleep disturbance. The patient is not nervous/anxious.     Objective:  BP (!) 138/92 (BP Location: Left Arm, Patient Position: Sitting, Cuff Size: Large)   Pulse 97   Temp 98 F (36.7 C) (Oral)   Ht 6\' 2"  (1.88 m)   Wt 216 lb (98 kg)   SpO2 98%   BMI 27.73 kg/m   BP Readings from Last 3 Encounters:  09/06/17 (!) 138/92  06/06/17 124/76  04/26/17 (!) 147/88    Wt Readings from Last 3 Encounters:  09/06/17 216 lb (98 kg)  06/06/17 221 lb (100.2 kg)  02/26/17 218 lb (98.9 kg)    Physical Exam  Constitutional: He is oriented to person, place, and time. He appears well-developed. No distress.  NAD  HENT:  Mouth/Throat: Oropharynx is clear and moist.  Eyes: Conjunctivae are normal. Pupils are equal, round, and reactive to light.  Neck: Normal  range of motion. No JVD present. No thyromegaly present.  Cardiovascular: Normal rate, regular rhythm, normal heart sounds and intact distal pulses. Exam reveals no gallop and no friction rub.  No murmur heard. Pulmonary/Chest: Effort normal and breath sounds normal. No respiratory distress. He has no wheezes. He has no rales. He exhibits no tenderness.  Abdominal: Soft. Bowel sounds are normal. He exhibits no distension and no mass. There is no tenderness. There is no rebound and no guarding.  Musculoskeletal: Normal range of motion. He exhibits tenderness. He exhibits no edema.  Lymphadenopathy:    He has no cervical adenopathy.    Neurological: He is alert and oriented to person, place, and time. He has normal reflexes. No cranial nerve deficit. He exhibits normal muscle tone. He displays a negative Romberg sign. Coordination and gait normal.  Skin: Skin is warm and dry. No rash noted.  Psychiatric: He has a normal mood and affect. His behavior is normal. Judgment and thought content normal.  painful joints  Lab Results  Component Value Date   WBC 5.0 08/29/2016   HGB 13.6 08/29/2016   HCT 40.6 08/29/2016   PLT 215.0 08/29/2016   GLUCOSE 170 (H) 02/26/2017   CHOL 246 (H) 08/29/2016   TRIG 157.0 (H) 08/29/2016   HDL 52.80 08/29/2016   LDLDIRECT 168.0 08/12/2015   LDLCALC 161 (H) 08/29/2016   ALT 21 08/29/2016   AST 21 08/29/2016   NA 137 02/26/2017   K 4.2 02/26/2017   CL 101 02/26/2017   CREATININE 1.08 02/26/2017   BUN 10 02/26/2017   CO2 28 02/26/2017   TSH 0.76 08/29/2016   PSA 6.38 (H) 08/29/2016   INR 1.02 10/29/2014    US Abdomen Complete  Result Date: 03/13/2016 CLINICAL DATA:  Abdominal pain . EXAM: ABDOMEN ULTRASOUND COMPLETE COMPARISON:  None. FINDINGS: Gallbladder: No gallstones or wall thickening visualized. No sonographic Murphy sign noted by sonographer. Common bile duct: Diameter: 5.7 mm Liver: There is echogenic consistent fatty infiltration and/or hepatocellular disease. No focal hepatic abnormality identified. IVC: No abnormality visualized. Pancreas: Visualized portion unremarkable. Spleen: Size and appearance within normal limits. Right Kidney: Length: 12.2 cm. Echogenicity within normal limits. No mass or hydronephrosis visualized. Left Kidney: Length: 11.9 cm. Echogenicity within normal limits. No mass or hydronephrosis visualized. 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 Other findings: None. IMPRESSION: 1. Liver is echogenic consistent fatty infiltration and/or hepatocellular disease . No acute abnormality. 2. 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 Ectatic abdominal aorta  at risk for aneurysm development. Recommend followup by ultrasound in 5 years. This recommendation follows ACR consensus guidelines: White Paper of the ACR Incidental Findings Committee II on Vascular Findings. J Am Coll Radiol 2013; 10:789-794. Electronically Signed   By: Marcello Moores  Register   On: 03/13/2016 12:29    Assessment & Plan:   There are no diagnoses linked to this encounter. I have discontinued Benigno Check. Lanni's triamcinolone ointment. I am also having him start on triamcinolone 0.1 % cream : eucerin. Additionally, I am having him maintain his loratadine, esomeprazole, Simethicone (GAS-X PO), OVER THE COUNTER MEDICATION, acetaminophen, aspirin, docusate sodium, polyethylene glycol, tolterodine, Vitamin D3, celecoxib, and terazosin.  Meds ordered this encounter  Medications  . Triamcinolone Acetonide (TRIAMCINOLONE 0.1 % CREAM : EUCERIN) CREA    Sig: Apply 1 application topically 2 (two) times daily as needed.    Dispense:  1 each    Refill:  3     Follow-up: No Follow-up on file.  Walker Kehr, MD

## 2017-09-06 NOTE — Assessment & Plan Note (Signed)
D/c Celebrex - did not help Tramadol prn  Potential benefits of a long term opioids use as well as potential risks (i.e. addiction risk, apnea etc) and complications (i.e. Somnolence, constipation and others) were explained to the patient and were aknowledged.

## 2017-09-06 NOTE — Assessment & Plan Note (Signed)
Hytrin 

## 2017-09-06 NOTE — Assessment & Plan Note (Signed)
Kenalog in Eucerin topically

## 2017-10-24 ENCOUNTER — Ambulatory Visit: Payer: Medicare Other | Admitting: Podiatry

## 2017-10-24 ENCOUNTER — Encounter: Payer: Self-pay | Admitting: Podiatry

## 2017-10-24 DIAGNOSIS — B351 Tinea unguium: Secondary | ICD-10-CM

## 2017-10-24 DIAGNOSIS — M79606 Pain in leg, unspecified: Secondary | ICD-10-CM

## 2017-10-24 NOTE — Patient Instructions (Signed)
Seen for hypertrophic nails. All nails debrided. Return in 3 months or as needed.  

## 2017-10-24 NOTE — Progress Notes (Signed)
Subjective: 77 y.o. year old male patient presents complaining of painful nails and calluses. Patient requests toe nails, corns and calluses trimmed.   Objective: Dermatologic: Thick yellow deformed nails x 10. Plantar hallucal callus at IPJ bilateral. Vascular: Pedal pulses are all palpable. Orthopedic: Severe HAV with bunion deformity left. Hyperextended great toe IPJ bilateral. Neurologic: All epicritic and tactile sensations grossly intact.  Assessment: Dystrophic mycotic nails x 10. Painful feet.  Treatment: All mycotic nails, corns, calluses debrided.  Return in 3 months or as needed.

## 2017-11-06 ENCOUNTER — Other Ambulatory Visit (INDEPENDENT_AMBULATORY_CARE_PROVIDER_SITE_OTHER): Payer: Medicare Other

## 2017-11-06 ENCOUNTER — Encounter: Payer: Self-pay | Admitting: Internal Medicine

## 2017-11-06 ENCOUNTER — Ambulatory Visit: Payer: Medicare Other | Admitting: Internal Medicine

## 2017-11-06 DIAGNOSIS — K219 Gastro-esophageal reflux disease without esophagitis: Secondary | ICD-10-CM | POA: Diagnosis not present

## 2017-11-06 DIAGNOSIS — E782 Mixed hyperlipidemia: Secondary | ICD-10-CM | POA: Diagnosis not present

## 2017-11-06 DIAGNOSIS — R972 Elevated prostate specific antigen [PSA]: Secondary | ICD-10-CM

## 2017-11-06 DIAGNOSIS — L309 Dermatitis, unspecified: Secondary | ICD-10-CM

## 2017-11-06 DIAGNOSIS — I10 Essential (primary) hypertension: Secondary | ICD-10-CM

## 2017-11-06 DIAGNOSIS — M1991 Primary osteoarthritis, unspecified site: Secondary | ICD-10-CM | POA: Diagnosis not present

## 2017-11-06 DIAGNOSIS — R7309 Other abnormal glucose: Secondary | ICD-10-CM | POA: Diagnosis not present

## 2017-11-06 LAB — BASIC METABOLIC PANEL
BUN: 15 mg/dL (ref 6–23)
CALCIUM: 9.4 mg/dL (ref 8.4–10.5)
CO2: 27 mEq/L (ref 19–32)
Chloride: 103 mEq/L (ref 96–112)
Creatinine, Ser: 1.19 mg/dL (ref 0.40–1.50)
GFR: 76.37 mL/min (ref >60.00)
GLUCOSE: 108 mg/dL — AB (ref 70–99)
POTASSIUM: 3.6 meq/L (ref 3.5–5.1)
Sodium: 140 mEq/L (ref 135–145)

## 2017-11-06 LAB — LIPID PANEL
CHOL/HDL RATIO: 5
Cholesterol: 218 mg/dL — ABNORMAL HIGH (ref 0–200)
HDL: 41.6 mg/dL (ref >39.00)
LDL Cholesterol: 147 mg/dL — ABNORMAL HIGH (ref 0–99)
NONHDL: 176.6
Triglycerides: 147 mg/dL (ref 0.0–149.0)
VLDL: 29.4 mg/dL (ref 0.0–40.0)

## 2017-11-06 LAB — HEMOGLOBIN A1C: Hgb A1c MFr Bld: 7 % — ABNORMAL HIGH (ref 4.6–6.5)

## 2017-11-06 LAB — TSH: TSH: 1 u[IU]/mL (ref 0.35–4.50)

## 2017-11-06 MED ORDER — ESOMEPRAZOLE MAGNESIUM 40 MG PO CPDR: 40.0000 mg | DELAYED_RELEASE_CAPSULE | Freq: Every day | ORAL | 11 refills | Status: DC

## 2017-11-06 MED ORDER — TRAMADOL HCL 50 MG PO TABS: 50.0000 mg | ORAL_TABLET | Freq: Three times a day (TID) | ORAL | 0 refills | Status: DC | PRN

## 2017-11-06 MED ORDER — TRIAMCINOLONE ACETONIDE 0.1 % EX CREA: TOPICAL_CREAM | CUTANEOUS | 3 refills | Status: DC

## 2017-11-06 MED ORDER — TRAMADOL HCL 50 MG PO TABS: 50.0000 mg | ORAL_TABLET | Freq: Three times a day (TID) | ORAL | 3 refills | Status: DC | PRN

## 2017-11-06 NOTE — Assessment & Plan Note (Signed)
Hytrin 

## 2017-11-06 NOTE — Assessment & Plan Note (Signed)
Triamcinolone prn on skin

## 2017-11-06 NOTE — Assessment & Plan Note (Signed)
Statin intolerant 

## 2017-11-06 NOTE — Assessment & Plan Note (Signed)
Nex[employer] 

## 2017-11-06 NOTE — Progress Notes (Signed)
Subjective:  Patient ID: Brian Mora, male    DOB: September 26, 1940  Age: 77 y.o. MRN: [id number]  CC: No chief complaint on file.   HPI Brian Mora presents for OA, rash, HTN f/u  Outpatient Medications Prior to Visit  Medication Sig Dispense Refill  . acetaminophen (TYLENOL) 325 MG tablet Take 2 tablets (650 mg total) by mouth every 6 (six) hours as needed for mild pain (or Fever >/= 101).    Marland Kitchen aspirin EC 325 MG EC tablet 1 tab a day for the next 30 days to prevent blood clots 30 tablet 0  . celecoxib (CELEBREX) 200 MG capsule 1 tab po q day with food for pain and  arthritis 30 capsule 3  . Cholecalciferol (VITAMIN D3) 2000 units capsule Take 1 capsule (2,000 Units total) by mouth daily. 100 capsule 3  . docusate sodium (COLACE) 100 MG capsule Take 1 capsule (100 mg total) by mouth 2 (two) times daily. 10 capsule 0  . esomeprazole (NEXIUM) 20 MG capsule Take 20 mg by mouth daily at 12 noon.    . loratadine (CLARITIN) 10 MG tablet Take 10 mg by mouth daily as needed for allergies.    Marland Kitchen OVER THE COUNTER MEDICATION 1 tablet. OTC antacid from [facility]    . polyethylene glycol (MIRALAX / GLYCOLAX) packet Take 17 g by mouth 2 (two) times daily. 60 each 0  . Simethicone (GAS-X PO) Take 1 tablet by mouth as needed.    . terazosin (HYTRIN) 1 MG capsule Take 1 capsule (1 mg total) by mouth at bedtime. 30 capsule 11  . tolterodine (DETROL LA) 4 MG 24 hr capsule Take 1 capsule (4 mg total) by mouth daily. 30 capsule 11  . traMADol (ULTRAM) 50 MG tablet Take 1 tablet (50 mg total) by mouth every 8 (eight) hours as needed. 28 tablet 0  . Triamcinolone Acetonide (TRIAMCINOLONE 0.1 % CREAM : EUCERIN) CREA Apply 1 application topically 2 (two) times daily as needed. 1 each 3  . triamcinolone cream (KENALOG) 0.1 % APPLY 1 APPLICATION TWICE DAILY AS NEEDED  3   No facility-administered medications prior to visit.     ROS Review of Systems  Constitutional: Negative for appetite change, fatigue  and unexpected weight change.  HENT: Negative for congestion, nosebleeds, sneezing, sore throat and trouble swallowing.   Eyes: Negative for itching and visual disturbance.  Respiratory: Negative for cough.   Cardiovascular: Negative for chest pain, palpitations and leg swelling.  Gastrointestinal: Negative for abdominal distention, blood in stool, diarrhea and nausea.  Genitourinary: Negative for frequency and hematuria.  Musculoskeletal: Positive for arthralgias and back pain. Negative for gait problem, joint swelling and neck pain.  Skin: Positive for rash.  Neurological: Negative for dizziness, tremors, speech difficulty and weakness.  Psychiatric/Behavioral: Negative for agitation, dysphoric mood and sleep disturbance. The patient is not nervous/anxious.     Objective:  BP 132/78 (BP Location: Left Arm, Patient Position: Sitting, Cuff Size: Normal)   Pulse (!) 107   Temp 97.7 F (36.5 C) (Oral)   Ht 6\' 2"  (1.88 m)   Wt 213 lb (96.6 kg)   SpO2 97%   BMI 27.35 kg/m   BP Readings from Last 3 Encounters:  11/06/17 132/78  09/06/17 (!) 138/92  06/06/17 124/76    Wt Readings from Last 3 Encounters:  11/06/17 213 lb (96.6 kg)  09/06/17 216 lb (98 kg)  06/06/17 221 lb (100.2 kg)    Physical Exam  Constitutional: He is oriented  to person, place, and time. He appears well-developed. No distress.  NAD  HENT:  Mouth/Throat: Oropharynx is clear and moist.  Eyes: Pupils are equal, round, and reactive to light. Conjunctivae are normal.  Neck: Normal range of motion. No JVD present. No thyromegaly present.  Cardiovascular: Normal rate, regular rhythm, normal heart sounds and intact distal pulses. Exam reveals no gallop and no friction rub.  No murmur heard. Pulmonary/Chest: Effort normal and breath sounds normal. No respiratory distress. He has no wheezes. He has no rales. He exhibits no tenderness.  Abdominal: Soft. Bowel sounds are normal. He exhibits no distension and no mass.  There is no tenderness. There is no rebound and no guarding.  Musculoskeletal: Normal range of motion. He exhibits no edema or tenderness.  Lymphadenopathy:    He has no cervical adenopathy.  Neurological: He is alert and oriented to person, place, and time. He has normal reflexes. No cranial nerve deficit. He exhibits normal muscle tone. He displays a negative Romberg sign. Coordination and gait normal.  Skin: Skin is warm and dry. No rash noted.  Psychiatric: He has a normal mood and affect. His behavior is normal. Judgment and thought content normal.    Lab Results  Component Value Date   WBC 5.0 08/29/2016   HGB 13.6 08/29/2016   HCT 40.6 08/29/2016   PLT 215.0 08/29/2016   GLUCOSE 170 (H) 02/26/2017   CHOL 246 (H) 08/29/2016   TRIG 157.0 (H) 08/29/2016   HDL 52.80 08/29/2016   LDLDIRECT 168.0 08/12/2015   LDLCALC 161 (H) 08/29/2016   ALT 21 08/29/2016   AST 21 08/29/2016   NA 137 02/26/2017   K 4.2 02/26/2017   CL 101 02/26/2017   CREATININE 1.08 02/26/2017   BUN 10 02/26/2017   CO2 28 02/26/2017   TSH 0.76 08/29/2016   PSA 6.38 (H) 08/29/2016   INR 1.02 10/29/2014    US Abdomen Complete  Result Date: 03/13/2016 CLINICAL DATA:  Abdominal pain . EXAM: ABDOMEN ULTRASOUND COMPLETE COMPARISON:  None. FINDINGS: Gallbladder: No gallstones or wall thickening visualized. No sonographic Murphy sign noted by sonographer. Common bile duct: Diameter: 5.7 mm Liver: There is echogenic consistent fatty infiltration and/or hepatocellular disease. No focal hepatic abnormality identified. IVC: No abnormality visualized. Pancreas: Visualized portion unremarkable. Spleen: Size and appearance within normal limits. Right Kidney: Length: 12.2 cm. Echogenicity within normal limits. No mass or hydronephrosis visualized. Left Kidney: Length: 11.9 cm. Echogenicity within normal limits. No mass or hydronephrosis visualized. 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 Other findings: None.  IMPRESSION: 1. Liver is echogenic consistent fatty infiltration and/or hepatocellular disease . No acute abnormality. 2. 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 Recommend followup by ultrasound in 5 years. This recommendation follows ACR consensus guidelines: White Paper of the ACR Incidental Findings Committee II on Vascular Findings. J Am Coll Radiol 2013; 10:789-794. Electronically Signed   By: Marcello Moores  Register   On: 03/13/2016 12:29    Assessment & Plan:   There are no diagnoses linked to this encounter. I am having Theresa Duty maintain his loratadine, esomeprazole, Simethicone (GAS-X PO), OVER THE COUNTER MEDICATION, acetaminophen, aspirin, docusate sodium, polyethylene glycol, tolterodine, Vitamin D3, celecoxib, terazosin, triamcinolone 0.1 % cream : eucerin, traMADol, and triamcinolone cream.  No orders of the defined types were placed in this encounter.    Follow-up: No follow-ups on file.  Walker Kehr, MD

## 2017-11-06 NOTE — Assessment & Plan Note (Signed)
Tramadol prn ° Potential benefits of a long term opioids use as well as potential risks (i.e. addiction risk, apnea etc) and complications (i.e. Somnolence, constipation and others) were explained to the patient and were aknowledged. ° ° °

## 2017-11-07 LAB — PSA, TOTAL AND FREE
PSA, FREE: 0.8 ng/mL
PSA, Total: 12.1 ng/mL — ABNORMAL HIGH (ref <=4.0)

## 2018-01-15 ENCOUNTER — Other Ambulatory Visit: Payer: Self-pay | Admitting: Internal Medicine

## 2018-01-31 ENCOUNTER — Ambulatory Visit: Payer: Medicare Other | Admitting: Podiatry

## 2018-01-31 ENCOUNTER — Encounter: Payer: Self-pay | Admitting: Podiatry

## 2018-01-31 DIAGNOSIS — B351 Tinea unguium: Secondary | ICD-10-CM

## 2018-01-31 DIAGNOSIS — M79671 Pain in right foot: Secondary | ICD-10-CM

## 2018-01-31 DIAGNOSIS — M79672 Pain in left foot: Secondary | ICD-10-CM

## 2018-01-31 NOTE — Patient Instructions (Signed)
Seen for hypertrophic nails and painful callus. All nails and calluses debrided. Return in 3 months or as needed.  

## 2018-01-31 NOTE — Progress Notes (Signed)
Subjective: 77 y.o. year old male patient presents complaining of painful feet. Had to do a little self care trimming calluses. Bunion is still bothering a little. Still working as a Education officer, [employer].  Objective: Dermatologic: Thick yellow deformed nails x 10. Painful callus under right great toe. Vascular: Pedal pulses are all palpable. Orthopedic: Contracted lesser digits with severe hallux valgus with bunion on left. Hyperextended IPJ bilateral. Neurologic: All epicritic and tactile sensations grossly intact.  Assessment: Dystrophic mycotic nails x 10. Painful callus right great toe. Painful bunion left foot.  Treatment: All mycotic nails, corns, calluses debrided.  Return in 3 months or sooner if needed.

## 2018-02-06 ENCOUNTER — Encounter: Payer: Self-pay | Admitting: Internal Medicine

## 2018-02-06 ENCOUNTER — Ambulatory Visit: Payer: Medicare Other | Admitting: Internal Medicine

## 2018-02-06 DIAGNOSIS — E782 Mixed hyperlipidemia: Secondary | ICD-10-CM | POA: Diagnosis not present

## 2018-02-06 DIAGNOSIS — R972 Elevated prostate specific antigen [PSA]: Secondary | ICD-10-CM

## 2018-02-06 DIAGNOSIS — I10 Essential (primary) hypertension: Secondary | ICD-10-CM

## 2018-02-06 DIAGNOSIS — R7309 Other abnormal glucose: Secondary | ICD-10-CM | POA: Diagnosis not present

## 2018-02-06 DIAGNOSIS — M1991 Primary osteoarthritis, unspecified site: Secondary | ICD-10-CM

## 2018-02-06 NOTE — Progress Notes (Signed)
Subjective:  Patient ID: Brian Mora, male    DOB: [birth year]  Age: 77 y.o. MRN: [id number]  CC: No chief complaint on file.   HPI Brian Mora presents for OA pains w/o stiffness - off and on C/o urinary frequency. Drinking sodas w/sugar F/u HTN, DM   Outpatient Medications Prior to Visit  Medication Sig Dispense Refill  . acetaminophen (TYLENOL) 325 MG tablet Take 2 tablets (650 mg total) by mouth every 6 (six) hours as needed for mild pain (or Fever >/= 101).    Marland Kitchen aspirin EC 325 MG EC tablet 1 tab a day for the next 30 days to prevent blood clots 30 tablet 0  . Cholecalciferol (VITAMIN D3) 2000 units capsule Take 1 capsule (2,000 Units total) by mouth daily. 100 capsule 3  . docusate sodium (COLACE) 100 MG capsule Take 1 capsule (100 mg total) by mouth 2 (two) times daily. 10 capsule 0  . esomeprazole (NEXIUM) 40 MG capsule Take 1 capsule (40 mg total) by mouth daily. 30 capsule 11  . loratadine (CLARITIN) 10 MG tablet Take 10 mg by mouth daily as needed for allergies.    Marland Kitchen OVER THE COUNTER MEDICATION 1 tablet. OTC antacid from [facility]    . polyethylene glycol (MIRALAX / GLYCOLAX) packet Take 17 g by mouth 2 (two) times daily. 60 each 0  . Simethicone (GAS-X PO) Take 1 tablet by mouth as needed.    . terazosin (HYTRIN) 1 MG capsule TAKE ONE CAPSULE AT BEDTIME 30 capsule 10  . tolterodine (DETROL LA) 4 MG 24 hr capsule Take 1 capsule (4 mg total) by mouth daily. 30 capsule 11  . traMADol (ULTRAM) 50 MG tablet Take 1 tablet (50 mg total) by mouth every 8 (eight) hours as needed. 90 tablet 3  . Triamcinolone Acetonide (TRIAMCINOLONE 0.1 % CREAM : EUCERIN) CREA Apply 1 application topically 2 (two) times daily as needed. 1 each 3  . triamcinolone cream (KENALOG) 0.1 % APPLY 1 APPLICATION TWICE DAILY AS NEEDED 80 g 3  . celecoxib (CELEBREX) 200 MG capsule 1 tab po q day with food for pain and  arthritis (Patient not taking: Reported on 02/06/2018) 30 capsule 3   No  facility-administered medications prior to visit.     ROS: Review of Systems  Constitutional: Negative for appetite change, fatigue and unexpected weight change.  HENT: Negative for congestion, nosebleeds, sneezing, sore throat and trouble swallowing.   Eyes: Negative for itching and visual disturbance.  Respiratory: Negative for cough.   Cardiovascular: Negative for chest pain, palpitations and leg swelling.  Gastrointestinal: Negative for abdominal distention, blood in stool, diarrhea and nausea.  Genitourinary: Positive for frequency. Negative for hematuria.  Musculoskeletal: Positive for arthralgias. Negative for back pain, gait problem, joint swelling and neck pain.  Skin: Negative for rash.  Neurological: Negative for dizziness, tremors, speech difficulty and weakness.  Psychiatric/Behavioral: Negative for agitation, dysphoric mood and sleep disturbance. The patient is not nervous/anxious.     Objective:  BP 130/90 (BP Location: Left Arm, Patient Position: Sitting, Cuff Size: Normal)   Pulse 92   Ht 6\' 2"  (1.88 m)   Wt 216 lb (98 kg)   SpO2 95%   BMI 27.73 kg/m   BP Readings from Last 3 Encounters:  02/06/18 130/90  11/06/17 132/78  09/06/17 (!) 138/92    Wt Readings from Last 3 Encounters:  02/06/18 216 lb (98 kg)  11/06/17 213 lb (96.6 kg)  09/06/17 216 lb (98 kg)  Physical Exam  Constitutional: He is oriented to person, place, and time. He appears well-developed. No distress.  NAD  HENT:  Mouth/Throat: Oropharynx is clear and moist.  Eyes: Pupils are equal, round, and reactive to light. Conjunctivae are normal.  Neck: Normal range of motion. No JVD present. No thyromegaly present.  Cardiovascular: Normal rate, regular rhythm, normal heart sounds and intact distal pulses. Exam reveals no gallop and no friction rub.  No murmur heard. Pulmonary/Chest: Effort normal and breath sounds normal. No respiratory distress. He has no wheezes. He has no rales. He  exhibits no tenderness.  Abdominal: Soft. Bowel sounds are normal. He exhibits no distension and no mass. There is no tenderness. There is no rebound and no guarding.  Musculoskeletal: Normal range of motion. He exhibits no edema or tenderness.  Lymphadenopathy:    He has no cervical adenopathy.  Neurological: He is alert and oriented to person, place, and time. He has normal reflexes. No cranial nerve deficit. He exhibits normal muscle tone. He displays a negative Romberg sign. Coordination and gait normal.  Skin: Skin is warm and dry. No rash noted.  Psychiatric: He has a normal mood and affect. His behavior is normal. Judgment and thought content normal.    Lab Results  Component Value Date   WBC 5.0 08/29/2016   HGB 13.6 08/29/2016   HCT 40.6 08/29/2016   PLT 215.0 08/29/2016   GLUCOSE 108 (H) 11/06/2017   CHOL 218 (H) 11/06/2017   TRIG 147.0 11/06/2017   HDL 41.60 11/06/2017   LDLDIRECT 168.0 08/12/2015   LDLCALC 147 (H) 11/06/2017   ALT 21 08/29/2016   AST 21 08/29/2016   NA 140 11/06/2017   K 3.6 11/06/2017   CL 103 11/06/2017   CREATININE 1.19 11/06/2017   BUN 15 11/06/2017   CO2 27 11/06/2017   TSH 1.00 11/06/2017   PSA 6.38 (H) 08/29/2016   INR 1.02 10/29/2014   HGBA1C 7.0 (H) 11/06/2017    US Abdomen Complete  Result Date: 03/13/2016 CLINICAL DATA:  Abdominal pain . EXAM: ABDOMEN ULTRASOUND COMPLETE COMPARISON:  None. FINDINGS: Gallbladder: No gallstones or wall thickening visualized. No sonographic Murphy sign noted by sonographer. Common bile duct: Diameter: 5.7 mm Liver: There is echogenic consistent fatty infiltration and/or hepatocellular disease. No focal hepatic abnormality identified. IVC: No abnormality visualized. Pancreas: Visualized portion unremarkable. Spleen: Size and appearance within normal limits. Right Kidney: Length: 12.2 cm. Echogenicity within normal limits. No mass or hydronephrosis visualized. Left Kidney: Length: 11.9 cm. Echogenicity within  normal limits. No mass or hydronephrosis visualized. 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 Other findings: None. IMPRESSION: 1. Liver is echogenic consistent fatty infiltration and/or hepatocellular disease . No acute abnormality. 2. 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 Recommend followup by ultrasound in 5 years. This recommendation follows ACR consensus guidelines: White Paper of the ACR Incidental Findings Committee II on Vascular Findings. J Am Coll Radiol 2013; 10:789-794. Electronically Signed   By: Marcello Moores  Register   On: 03/13/2016 12:29    Assessment & Plan:   There are no diagnoses linked to this encounter.   No orders of the defined types were placed in this encounter.    Follow-up: No follow-ups on file.  Walker Kehr, MD

## 2018-02-06 NOTE — Assessment & Plan Note (Signed)
Tylenol Tramadol did not help much. Try 100 mg prn

## 2018-02-06 NOTE — Assessment & Plan Note (Signed)
Reduce carbs Labs Wt loss

## 2018-02-06 NOTE — Patient Instructions (Addendum)
Try Agave syrup in place of sugar

## 2018-02-06 NOTE — Assessment & Plan Note (Signed)
Statin intolerant 

## 2018-02-06 NOTE — Assessment & Plan Note (Signed)
Monitoring  - PSA in 3 mo Pt declined Urol ref

## 2018-02-06 NOTE — Assessment & Plan Note (Signed)
Hytrin Wt loss

## 2018-05-07 ENCOUNTER — Ambulatory Visit: Payer: Medicare Other | Admitting: Podiatry

## 2018-05-07 ENCOUNTER — Encounter: Payer: Self-pay | Admitting: Podiatry

## 2018-05-07 DIAGNOSIS — M79672 Pain in left foot: Secondary | ICD-10-CM | POA: Diagnosis not present

## 2018-05-07 DIAGNOSIS — M79671 Pain in right foot: Secondary | ICD-10-CM | POA: Diagnosis not present

## 2018-05-07 DIAGNOSIS — B351 Tinea unguium: Secondary | ICD-10-CM | POA: Diagnosis not present

## 2018-05-07 NOTE — Patient Instructions (Signed)
Seen for hypertrophic nails. All nails debrided. Return in 3 months or as needed.  

## 2018-05-07 NOTE — Progress Notes (Signed)
Subjective: 77 y.o. year old male patient presents complaining of painful feet. Patient request toe nails and calluses trimmed.  Objective: Dermatologic: Thick yellow deformed nails x 10. Painful callus under right great toe. Vascular: Pedal pulses are all palpable. Orthopedic: Contracted lesser digits with severe hallux valgus with bunion on left. Hyperextended IPJ bilateral. Neurologic: All epicritic and tactile sensations grossly intact.  Assessment: Dystrophic mycotic nails x 10. Painful callus right great toe. Painful bunion left foot.  Treatment: All mycotic nails, corns, calluses debrided.  Return in 3 months or sooner if needed.

## 2018-05-09 ENCOUNTER — Encounter: Payer: Self-pay | Admitting: Internal Medicine

## 2018-05-09 ENCOUNTER — Ambulatory Visit: Payer: Medicare Other | Admitting: Internal Medicine

## 2018-05-09 ENCOUNTER — Other Ambulatory Visit (INDEPENDENT_AMBULATORY_CARE_PROVIDER_SITE_OTHER): Payer: Medicare Other

## 2018-05-09 DIAGNOSIS — R7309 Other abnormal glucose: Secondary | ICD-10-CM | POA: Diagnosis not present

## 2018-05-09 DIAGNOSIS — R972 Elevated prostate specific antigen [PSA]: Secondary | ICD-10-CM | POA: Diagnosis not present

## 2018-05-09 DIAGNOSIS — I10 Essential (primary) hypertension: Secondary | ICD-10-CM | POA: Diagnosis not present

## 2018-05-09 LAB — PSA: PSA: 13.09 ng/mL — ABNORMAL HIGH (ref 0.10–4.00)

## 2018-05-09 LAB — BASIC METABOLIC PANEL
BUN: 13 mg/dL (ref 6–23)
CALCIUM: 9.5 mg/dL (ref 8.4–10.5)
CHLORIDE: 103 meq/L (ref 96–112)
CO2: 32 meq/L (ref 19–32)
CREATININE: 1.12 mg/dL (ref 0.40–1.50)
GFR: 81.79 mL/min (ref >60.00)
Glucose, Bld: 111 mg/dL — ABNORMAL HIGH (ref 70–99)
Potassium: 4.2 mEq/L (ref 3.5–5.1)
Sodium: 140 mEq/L (ref 135–145)

## 2018-05-09 LAB — HEMOGLOBIN A1C: HEMOGLOBIN A1C: 7 % — AB (ref 4.6–6.5)

## 2018-05-09 MED ORDER — TRAMADOL HCL 50 MG PO TABS: 50.0000 mg | ORAL_TABLET | Freq: Three times a day (TID) | ORAL | 3 refills | Status: DC | PRN

## 2018-05-09 MED ORDER — TRIAMCINOLONE ACETONIDE 0.1 % EX CREA: 1.0000 "application " | TOPICAL_CREAM | Freq: Two times a day (BID) | CUTANEOUS | 3 refills | Status: DC

## 2018-05-09 NOTE — Assessment & Plan Note (Signed)
Labs

## 2018-05-09 NOTE — Assessment & Plan Note (Signed)
Monitoring PSA 

## 2018-05-09 NOTE — Progress Notes (Signed)
Subjective:  Patient ID: Brian Mora, male    DOB: 22-Dec-1940  Age: 77 y.o. MRN: [id number]  CC: No chief complaint on file.   HPI Brian Mora presents for HTN, rash, OA f/u  Outpatient Medications Prior to Visit  Medication Sig Dispense Refill  . acetaminophen (TYLENOL) 325 MG tablet Take 2 tablets (650 mg total) by mouth every 6 (six) hours as needed for mild pain (or Fever >/= 101).    Marland Kitchen aspirin EC 325 MG EC tablet 1 tab a day for the next 30 days to prevent blood clots 30 tablet 0  . Cholecalciferol (VITAMIN D3) 2000 units capsule Take 1 capsule (2,000 Units total) by mouth daily. 100 capsule 3  . docusate sodium (COLACE) 100 MG capsule Take 1 capsule (100 mg total) by mouth 2 (two) times daily. 10 capsule 0  . esomeprazole (NEXIUM) 40 MG capsule Take 1 capsule (40 mg total) by mouth daily. 30 capsule 11  . loratadine (CLARITIN) 10 MG tablet Take 10 mg by mouth daily as needed for allergies.    Marland Kitchen OVER THE COUNTER MEDICATION 1 tablet. OTC antacid from [facility]    . polyethylene glycol (MIRALAX / GLYCOLAX) packet Take 17 g by mouth 2 (two) times daily. 60 each 0  . Simethicone (GAS-X PO) Take 1 tablet by mouth as needed.    . terazosin (HYTRIN) 1 MG capsule TAKE ONE CAPSULE AT BEDTIME 30 capsule 10  . tolterodine (DETROL LA) 4 MG 24 hr capsule Take 1 capsule (4 mg total) by mouth daily. 30 capsule 11  . Triamcinolone Acetonide (TRIAMCINOLONE 0.1 % CREAM : EUCERIN) CREA Apply 1 application topically 2 (two) times daily as needed. 1 each 3  . traMADol (ULTRAM) 50 MG tablet Take 1 tablet (50 mg total) by mouth every 8 (eight) hours as needed. 90 tablet 3  . triamcinolone cream (KENALOG) 0.1 % APPLY 1 APPLICATION TWICE DAILY AS NEEDED 80 g 3   No facility-administered medications prior to visit.     ROS: Review of Systems  Constitutional: Negative for appetite change, fatigue and unexpected weight change.  HENT: Negative for congestion, nosebleeds, sneezing, sore throat and  trouble swallowing.   Eyes: Negative for itching and visual disturbance.  Respiratory: Negative for cough.   Cardiovascular: Negative for chest pain, palpitations and leg swelling.  Gastrointestinal: Negative for abdominal distention, blood in stool, diarrhea and nausea.  Genitourinary: Negative for frequency and hematuria.  Musculoskeletal: Positive for arthralgias. Negative for back pain, gait problem, joint swelling and neck pain.  Skin: Positive for rash.  Neurological: Negative for dizziness, tremors, speech difficulty and weakness.  Psychiatric/Behavioral: Negative for agitation, dysphoric mood, sleep disturbance and suicidal ideas. The patient is not nervous/anxious.     Objective:  BP 132/86 (BP Location: Left Arm, Patient Position: Sitting, Cuff Size: Normal)   Pulse 69   Temp 98 F (36.7 C) (Oral)   Ht 6\' 2"  (1.88 m)   Wt 216 lb (98 kg)   SpO2 97%   BMI 27.73 kg/m   BP Readings from Last 3 Encounters:  05/09/18 132/86  02/06/18 130/90  11/06/17 132/78    Wt Readings from Last 3 Encounters:  05/09/18 216 lb (98 kg)  02/06/18 216 lb (98 kg)  11/06/17 213 lb (96.6 kg)    Physical Exam  Constitutional: He is oriented to person, place, and time. He appears well-developed. No distress.  NAD  HENT:  Mouth/Throat: Oropharynx is clear and moist.  Eyes: Pupils are equal,  round, and reactive to light. Conjunctivae are normal.  Neck: Normal range of motion. No JVD present. No thyromegaly present.  Cardiovascular: Normal rate, regular rhythm, normal heart sounds and intact distal pulses. Exam reveals no gallop and no friction rub.  No murmur heard. Pulmonary/Chest: Effort normal and breath sounds normal. No respiratory distress. He has no wheezes. He has no rales. He exhibits no tenderness.  Abdominal: Soft. Bowel sounds are normal. He exhibits no distension and no mass. There is no tenderness. There is no rebound and no guarding.  Musculoskeletal: Normal range of motion.  He exhibits no edema or tenderness.  Lymphadenopathy:    He has no cervical adenopathy.  Neurological: He is alert and oriented to person, place, and time. He has normal reflexes. No cranial nerve deficit. He exhibits normal muscle tone. He displays a negative Romberg sign. Coordination and gait normal.  Skin: Skin is warm and dry. No rash noted.  Psychiatric: He has a normal mood and affect. His behavior is normal. Judgment and thought content normal.  LS tender  Lab Results  Component Value Date   WBC 5.0 08/29/2016   HGB 13.6 08/29/2016   HCT 40.6 08/29/2016   PLT 215.0 08/29/2016   GLUCOSE 108 (H) 11/06/2017   CHOL 218 (H) 11/06/2017   TRIG 147.0 11/06/2017   HDL 41.60 11/06/2017   LDLDIRECT 168.0 08/12/2015   LDLCALC 147 (H) 11/06/2017   ALT 21 08/29/2016   AST 21 08/29/2016   NA 140 11/06/2017   K 3.6 11/06/2017   CL 103 11/06/2017   CREATININE 1.19 11/06/2017   BUN 15 11/06/2017   CO2 27 11/06/2017   TSH 1.00 11/06/2017   PSA 6.38 (H) 08/29/2016   INR 1.02 10/29/2014   HGBA1C 7.0 (H) 11/06/2017    US Abdomen Complete  Result Date: 03/13/2016 CLINICAL DATA:  Abdominal pain . EXAM: ABDOMEN ULTRASOUND COMPLETE COMPARISON:  None. FINDINGS: Gallbladder: No gallstones or wall thickening visualized. No sonographic Murphy sign noted by sonographer. Common bile duct: Diameter: 5.7 mm Liver: There is echogenic consistent fatty infiltration and/or hepatocellular disease. No focal hepatic abnormality identified. IVC: No abnormality visualized. Pancreas: Visualized portion unremarkable. Spleen: Size and appearance within normal limits. Right Kidney: Length: 12.2 cm. Echogenicity within normal limits. No mass or hydronephrosis visualized. Left Kidney: Length: 11.9 cm. Echogenicity within normal limits. No mass or hydronephrosis visualized. 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 Other findings: None. IMPRESSION: 1. Liver is echogenic consistent fatty infiltration and/or  hepatocellular disease . No acute abnormality. 2. 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 Recommend followup by ultrasound in 5 years. This recommendation follows ACR consensus guidelines: White Paper of the ACR Incidental Findings Committee II on Vascular Findings. J Am Coll Radiol 2013; 10:789-794. Electronically Signed   By: Marcello Moores  Register   On: 03/13/2016 12:29    Assessment & Plan:   There are no diagnoses linked to this encounter.   Meds ordered this encounter  Medications  . traMADol (ULTRAM) 50 MG tablet    Sig: Take 1 tablet (50 mg total) by mouth every 8 (eight) hours as needed for severe pain.    Dispense:  90 tablet    Refill:  3  . triamcinolone cream (KENALOG) 0.1 %    Sig: Apply 1 application topically 2 (two) times daily.    Dispense:  160 g    Refill:  3     Follow-up: No follow-ups on file.  Alex Plotnikov,  MD

## 2018-05-09 NOTE — Assessment & Plan Note (Signed)
Hytrin 

## 2018-07-19 ENCOUNTER — Telehealth: Payer: Self-pay | Admitting: Internal Medicine

## 2018-07-19 DIAGNOSIS — R972 Elevated prostate specific antigen [PSA]: Secondary | ICD-10-CM

## 2018-07-19 NOTE — Telephone Encounter (Signed)
Patient would like to have a referral sent for a colonoscopy to [facility].  Can this be done for him please?  Patient can be reached on his cell phone [phone]).

## 2018-07-22 NOTE — Telephone Encounter (Signed)
Please advise about referral 

## 2018-07-23 NOTE — Telephone Encounter (Signed)
We stop screening colonoscopies at 77 yo. Does he have a problem? We can do a Cologuard test. Thx

## 2018-07-23 NOTE — Telephone Encounter (Signed)
Pt was taking about urologist about PSA. Referral made

## 2018-08-08 ENCOUNTER — Telehealth: Payer: Self-pay | Admitting: *Deleted

## 2018-08-08 NOTE — Telephone Encounter (Signed)
Called patient to schedule AWV, patient schedule for 08/13/18.

## 2018-08-12 NOTE — Progress Notes (Addendum)
Subjective:   Brian Mora is a 78 y.o. male who presents for Medicare Annual/Subsequent preventive examination.  Review of Systems:  No ROS.  Medicare Wellness Visit. Additional risk factors are reflected in the social history.  Cardiac Risk Factors include: advanced age (>36men, >6 women);dyslipidemia;male gender;hypertension   Sleep patterns: feels rested on waking and does not get up to void. Pt. Does state he has noticed increased urination, but controls his need to wake up to urinate by avoiding drinking after 9PM.  Home Safety/Smoke Alarms: Feels safe in home. Smoke alarms in place.  Living environment; residence and Firearm Safety: 1-story house/ trailer. Lives alone, no needs for DME, good support system.  Seat Belt Safety/Bike Helmet: Wears seat belt.          PSA- Lab Results  Component Value Date   PSA 13.09 (H) 05/09/2018   PSA 6.38 (H) 08/29/2016   PSA 2.76 08/12/2015       Objective:    Vitals: BP 128/78   Pulse (!) 108   Ht 6\' 2"  (1.88 m)   Wt 213 lb (96.6 kg)   SpO2 96%   BMI 27.35 kg/m   Body mass index is 27.35 kg/m.  Advanced Directives 08/13/2018 10/29/2014  Does Patient Have a Medical Advance Directive? Yes Yes  Type of Paramedic of [place];Living will Living will  Copy of [place] in Chart? No - copy requested No - copy requested    Tobacco Social History   Tobacco Use  Smoking Status Former Smoker  . Packs/day: 0.50  . Years: 20.00  . Pack years: 10.00  . Last attempt to quit: 01/29/1993  . Years since quitting: 25.5  Smokeless Tobacco Never Used  Tobacco Comment   Quit in 1989     Counseling given: Not Answered Comment: Quit in 1989   Past Medical History:  Diagnosis Date  . Acquired hypertrophic pyloric stenosis    gastric outlet obstruction  . Dyspepsia and other specified disorders of function of stomach   . GERD (gastroesophageal reflux disease)    nexium   as needed  .  Hyperlipidemia   . Other esophagitis    erosive esophagitis  . Primary localized osteoarthritis of left knee   . Sexually transmitted disease (STD)    syphyillis in youth-medicdally treated  . Urgency of urination    Past Surgical History:  Procedure Laterality Date  . BUNIONECTOMY Right 1996  . CIRCUMCISION  1996   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  . HERNIA REPAIR    . 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 Heart disease Mother   . Stroke Mother   . Benign prostatic hyperplasia Father   . Alzheimer's disease Father   . Cancer Sister   . Diabetes Neg Hx   . COPD Neg Hx    Social History   Socioeconomic History  . Marital status: Divorced    Spouse name: Not on file  . Number of children: 3  . Years of education: 17  . Highest education level: Not on file  Occupational History  . Occupation: retired  . Occupation: Cab Education administrator: [employer]  . Financial resource strain: Not hard at all  . Food insecurity:    Worry: Never  true    Inability: Never true  . Transportation needs:    Medical: No    Non-medical: No  Tobacco Use  . Smoking status: Former Smoker    Packs/day: 0.50    Years: 20.00    Pack years: 10.00    Last attempt to quit: 01/29/1993    Years since quitting: 25.5  . Smokeless tobacco: Never Used  . Tobacco comment: Quit in 1989  Substance and Sexual Activity  . Alcohol use: No    Alcohol/week: 0.0 standard drinks  . Drug use: No  . Sexual activity: Yes  Lifestyle  . Physical activity:    Days per week: 3 days    Minutes per session: 20 min  . Stress: Only a little  Relationships  . Social connections:    Talks on phone: Three times a week    Gets together: Three times a week    Attends religious service: Not on file     Active member of club or organization: Not on file    Attends meetings of clubs or organizations: Not on file    Relationship status: Divorced  Other Topics Concern  . Not on file  Social History Narrative   HSG. Married '65 - 12 yrs/divorce, Married '80 -1 yr/divorced. 1 dtr - '67; 2 sons '63, '79; 4 grandchildren. Lives alone. 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    Lives alone, son and daughter local, active in care.     Outpatient Encounter Medications as of 08/13/2018  Medication Sig  . acetaminophen (TYLENOL) 325 MG tablet Take 2 tablets (650 mg total) by mouth every 6 (six) hours as needed for mild pain (or Fever >/= 101).  Marland Kitchen aspirin EC 325 MG EC tablet 1 tab a day for the next 30 days to prevent blood clots  . Cholecalciferol (VITAMIN D3) 2000 units capsule Take 1 capsule (2,000 Units total) by mouth daily.  Marland Kitchen docusate sodium (COLACE) 100 MG capsule Take 1 capsule (100 mg total) by mouth 2 (two) times daily.  Marland Kitchen esomeprazole (NEXIUM) 40 MG capsule Take 1 capsule (40 mg total) by mouth daily.  Marland Kitchen loratadine (CLARITIN) 10 MG tablet Take 10 mg by mouth daily as needed for allergies.  Marland Kitchen OVER THE COUNTER MEDICATION 1 tablet. OTC antacid from [facility]  . polyethylene glycol (MIRALAX / GLYCOLAX) packet Take 17 g by mouth 2 (two) times daily.  . Simethicone (GAS-X PO) Take 1 tablet by mouth as needed.  . terazosin (HYTRIN) 1 MG capsule TAKE ONE CAPSULE AT BEDTIME  . tolterodine (DETROL LA) 4 MG 24 hr capsule Take 1 capsule (4 mg total) by mouth daily.  . traMADol (ULTRAM) 50 MG tablet Take 1 tablet (50 mg total) by mouth every 8 (eight) hours as needed for severe pain.  Marland Kitchen triamcinolone cream (KENALOG) 0.1 % Apply 1 application topically 2 (two) times daily.   No facility-administered encounter medications on file as of 08/13/2018.     Activities of Daily Living In your present state of health, do you have any difficulty performing the following activities: 08/13/2018  Hearing? Y    Vision? N  Difficulty concentrating or making decisions? N  Walking or climbing stairs? N  Dressing or bathing? N  Doing errands, shopping? N  Preparing Food and eating ? N  Using the Toilet? N  In the past six months, have you accidently leaked urine? N  Do you have problems with loss of bowel control? N  Managing your Medications?  N  Managing your Finances? N  Housekeeping or managing your Housekeeping? N  Some recent data might be hidden    Patient Care Team: Plotnikov, Evie Lacks, MD as PCP - General (Internal Medicine)   Assessment:   This is a routine wellness examination for [place]. Physical assessment deferred to PCP.  Exercise Activities and Dietary recommendations Current Exercise Habits: Home exercise routine, Type of exercise: Other - see comments(bicycling), Frequency (Times/Week): 2, Intensity: Mild  Diet (meal preparation, eat out, water intake, caffeinated beverages, dairy products, fruits and vegetables): on average, 7 fast food meals per week. 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        Goals    . Patient Stated     "cut down on junk food because it doesn't agree with my stomach". Learning healthy options while going out to eat while on the road.       Fall Risk Fall Risk  08/13/2018 09/06/2017 07/25/2016  Falls in the past year? 0 No No  Number falls in past yr: 0 - -     Depression Screen PHQ 2/9 Scores 08/13/2018 09/06/2017 07/25/2016  PHQ - 2 Score 0 0 0    Cognitive Function        Ad8 score reviewed for issues:  Issues making decisions: no  Less interest in hobbies / activities: no  Repeats questions, stories (family complaining): no  Trouble using ordinary gadgets (microwave, computer, phone):no  Forgets the month or year: no  Mismanaging finances: no  Remembering appts: no  Daily problems with thinking and/or memory: no Ad8 score is= 0    Immunization History  Administered  Date(s) Administered  . Pneumococcal Polysaccharide-23 04/09/2013  . Tetanus 04/09/2013      Screening Tests Health Maintenance  Topic Date Due  . INFLUENZA VACCINE  11/06/2018 (Originally 03/07/2018)  . PNA vac Low Risk Adult (2 of 2 - PCV13) 08/14/2019 (Originally 04/09/2014)  . TETANUS/TDAP  04/10/2023      Pt. declined flu and pneumonia vaccinations at this time. Education provided. Plan:    Continue to eat heart healthy diet (full of fruits, vegetables, whole grains, lean protein, water--limit salt, fat, and sugar intake) and increase physical activity as tolerated.  Continue doing brain stimulating activities (puzzles, reading, adult coloring books, staying active) to keep memory sharp.   I have personally reviewed and noted the following in the patient's chart:   . Medical and social history . Use of alcohol, tobacco or illicit drugs  . Current medications and supplements . Functional ability and status . Nutritional status . Physical activity . Advanced directives . List of other physicians . Vitals . Screenings to include cognitive, depression, and falls . Referrals and appointments  In addition, I have reviewed and discussed with patient certain preventive protocols, quality metrics, and best practice recommendations. A written personalized care plan for preventive services as well as general preventive health recommendations were provided to patient.     Alphia Moh, RN  08/13/2018  Medical screening examination/treatment/procedure(s) were performed by non-physician practitioner and as supervising physician I was immediately available for consultation/collaboration. I agree with above. Lew Dawes, MD

## 2018-08-13 ENCOUNTER — Ambulatory Visit (INDEPENDENT_AMBULATORY_CARE_PROVIDER_SITE_OTHER): Payer: Medicare Other | Admitting: *Deleted

## 2018-08-13 ENCOUNTER — Encounter: Payer: Self-pay | Admitting: Internal Medicine

## 2018-08-13 ENCOUNTER — Ambulatory Visit: Payer: Medicare Other | Admitting: Internal Medicine

## 2018-08-13 VITALS — BP 128/78 | HR 108 | Ht 74.0 in | Wt 213.0 lb

## 2018-08-13 DIAGNOSIS — K219 Gastro-esophageal reflux disease without esophagitis: Secondary | ICD-10-CM

## 2018-08-13 DIAGNOSIS — L309 Dermatitis, unspecified: Secondary | ICD-10-CM | POA: Diagnosis not present

## 2018-08-13 DIAGNOSIS — I10 Essential (primary) hypertension: Secondary | ICD-10-CM | POA: Diagnosis not present

## 2018-08-13 DIAGNOSIS — Z0000 Encounter for general adult medical examination without abnormal findings: Secondary | ICD-10-CM

## 2018-08-13 MED ORDER — METHYLPREDNISOLONE ACETATE 40 MG/ML IJ SUSP
40.0000 mg | Freq: Once | INTRAMUSCULAR | Status: AC
  Administered 2018-08-13: 40 mg via INTRALESIONAL

## 2018-08-13 NOTE — Patient Instructions (Addendum)
Continue to eat heart healthy diet (full of fruits, vegetables, whole grains, lean protein, water--limit salt, fat, and sugar intake) and increase physical activity as tolerated.  Continue doing brain stimulating activities (puzzles, reading, adult coloring books, staying active) to keep memory sharp.    Brian Mora , Thank you for taking time to come for your Medicare Wellness Visit. I appreciate your ongoing commitment to your health goals. Please review the following plan we discussed and let me know if I can assist you in the future.   These are the goals we discussed: Goals    . Patient Stated     "cut down on junk food because it doesn't agree with my stomach". Learning healthy options while going out to eat while on the road.       This is a list of the screening recommended for you and due dates:  Health Maintenance  Topic Date Due  . Pneumonia vaccines (2 of 2 - PCV13) 04/09/2014  . Flu Shot  11/06/2018*  . Tetanus Vaccine  04/10/2023  *Topic was postponed. The date shown is not the original due date.     Fat and Cholesterol Restricted Eating Plan Getting too much fat and cholesterol in your diet may cause health problems. Choosing the right foods helps keep your fat and cholesterol at normal levels. This can keep you from getting certain diseases. Your doctor may recommend an eating plan that includes:  Total fat: ______% or less of total calories a day.  Saturated fat: ______% or less of total calories a day.  Cholesterol: less than _________mg a day.  Fiber: ______g a day. What are tips for following this plan? Meal planning  At meals, divide your plate into four equal parts: ? Fill one-half of your plate with vegetables and green salads. ? Fill one-fourth of your plate with whole grains. ? Fill one-fourth of your plate with low-fat (lean) protein foods.  Eat fish that is high in omega-3 fats at least two times a week. This includes mackerel, tuna,  sardines, and salmon.  Eat foods that are high in fiber, such as whole grains, beans, apples, broccoli, carrots, peas, and barley. General tips   Work with your doctor to lose weight if you need to.  Avoid: ? Foods with added sugar. ? Fried foods. ? Foods with partially hydrogenated oils.  Limit alcohol intake to no more than 1 drink a day for nonpregnant women and 2 drinks a day for men. One drink equals 12 oz of beer, 5 oz of wine, or 1 oz of hard liquor. Reading food labels  Check food labels for: ? Trans fats. ? Partially hydrogenated oils. ? Saturated fat (g) in each serving. ? Cholesterol (mg) in each serving. ? Fiber (g) in each serving.  Choose foods with healthy fats, such as: ? Monounsaturated fats. ? Polyunsaturated fats. ? Omega-3 fats.  Choose grain products that have whole grains. Look for the word "whole" as the first word in the ingredient list. Cooking  Cook foods using low-fat methods. These include baking, boiling, grilling, and broiling.  Eat more home-cooked foods. Eat at restaurants and buffets less often.  Avoid cooking using saturated fats, such as butter, cream, palm oil, palm kernel oil, and coconut oil. Recommended foods  Fruits  All fresh, canned (in natural juice), or frozen fruits. Vegetables  Fresh or frozen vegetables (raw, steamed, roasted, or grilled). Green salads. Grains  Whole grains, such as whole wheat or whole grain breads, crackers, cereals,  and pasta. Unsweetened oatmeal, bulgur, barley, quinoa, or brown rice. Corn or whole wheat flour tortillas. Meats and other protein foods  Ground beef (85% or leaner), grass-fed beef, or beef trimmed of fat. Skinless chicken or [place]. Ground chicken or [place]. Pork trimmed of fat. All fish and seafood. Egg whites. Dried beans, peas, or lentils. Unsalted nuts or seeds. Unsalted canned beans. Nut butters without added sugar or oil. Dairy  Low-fat or nonfat dairy products, such as skim or  1% milk, 2% or reduced-fat cheeses, low-fat and fat-free ricotta or cottage cheese, or plain low-fat and nonfat yogurt. Fats and oils  Tub margarine without trans fats. Light or reduced-fat mayonnaise and salad dressings. Avocado. Olive, canola, sesame, or safflower oils. The items listed above may not be a complete list of foods and beverages you can eat. Contact a dietitian for more information. Foods to avoid Fruits  Canned fruit in heavy syrup. Fruit in cream or butter sauce. Fried fruit. Vegetables  Vegetables cooked in cheese, cream, or butter sauce. Fried vegetables. Grains  White bread. White pasta. White rice. Cornbread. Bagels, pastries, and croissants. Crackers and snack foods that contain trans fat and hydrogenated oils. Meats and other protein foods  Fatty cuts of meat. Ribs, chicken wings, bacon, sausage, bologna, salami, chitterlings, fatback, hot dogs, bratwurst, and packaged lunch meats. Liver and organ meats. Whole eggs and egg yolks. Chicken and [place] with skin. Fried meat. Dairy  Whole or 2% milk, cream, half-and-half, and cream cheese. Whole milk cheeses. Whole-fat or sweetened yogurt. Full-fat cheeses. Nondairy creamers and whipped toppings. Processed cheese, cheese spreads, and cheese curds. Beverages  Alcohol. Sugar-sweetened drinks such as sodas, lemonade, and fruit drinks. Fats and oils  Butter, stick margarine, lard, shortening, ghee, or bacon fat. Coconut, palm kernel, and palm oils. Sweets and desserts  Corn syrup, sugars, honey, and molasses. Candy. Jam and jelly. Syrup. Sweetened cereals. Cookies, pies, cakes, donuts, muffins, and ice cream. The items listed above may not be a complete list of foods and beverages you should avoid. Contact a dietitian for more information. Summary  Choosing the right foods helps keep your fat and cholesterol at normal levels. This can keep you from getting certain diseases.  At meals, fill one-half of your plate with  vegetables and green salads.  Eat high-fiber foods, like whole grains, beans, apples, carrots, peas, and barley.  Limit added sugar, saturated fats, alcohol, and fried foods. This information is not intended to replace advice given to you by your health care provider. Make sure you discuss any questions you have with your health care provider. Document Released: 01/23/2012 Document Revised: 03/27/2018 Document Reviewed: 04/10/2017 Elsevier Interactive Patient Education  2019 [employer] Maintenance, Male A healthy lifestyle and preventive care is important for your health and wellness. Ask your health care provider about what schedule of regular examinations is right for you. What should I know about weight and diet? Eat a Healthy Diet  Eat plenty of vegetables, fruits, whole grains, low-fat dairy products, and lean protein.  Do not eat a lot of foods high in solid fats, added sugars, or salt.  Maintain a Healthy Weight Regular exercise can help you achieve or maintain a healthy weight. You should:  Do at least 150 minutes of exercise each week. The exercise should increase your heart rate and make you sweat (moderate-intensity exercise).  Do strength-training exercises at least twice a week. Watch Your Levels of Cholesterol and Blood Lipids  Have your blood tested for  lipids and cholesterol every 5 years starting at 78 years of age. If you are at high risk for heart disease, you should start having your blood tested when you are 78 years old. You may need to have your cholesterol levels checked more often if: ? Your lipid or cholesterol levels are high. ? You are older than 78 years of age. ? You are at high risk for heart disease. What should I know about cancer screening? Many types of cancers can be detected early and may often be prevented. Lung Cancer  You should be screened every year for lung cancer if: ? You are a current smoker who has smoked for at least 30  years. ? You are a former smoker who has quit within the past 15 years.  Talk to your health care provider about your screening options, when you should start screening, and how often you should be screened. Colorectal Cancer  Routine colorectal cancer screening usually begins at 78 years of age and should be repeated every 5-10 years until you are 78 years old. You may need to be screened more often if early forms of precancerous polyps or small growths are found. Your health care provider may recommend screening at an earlier age if you have risk factors for colon cancer.  Your health care provider may recommend using home test kits to check for hidden blood in the stool.  A small camera at the end of a tube can be used to examine your colon (sigmoidoscopy or colonoscopy). This checks for the earliest forms of colorectal cancer. Prostate and Testicular Cancer  Depending on your age and overall health, your health care provider may do certain tests to screen for prostate and testicular cancer.  Talk to your health care provider about any symptoms or concerns you have about testicular or prostate cancer. Skin Cancer  Check your skin from head to toe regularly.  Tell your health care provider about any new moles or changes in moles, especially if: ? There is a change in a mole's size, shape, or color. ? You have a mole that is larger than a pencil eraser.  Always use sunscreen. Apply sunscreen liberally and repeat throughout the day.  Protect yourself by wearing long sleeves, pants, a wide-brimmed hat, and sunglasses when outside. What should I know about heart disease, diabetes, and high blood pressure?  If you are 15-40 years of age, have your blood pressure checked every 3-5 years. If you are 59 years of age or older, have your blood pressure checked every year. You should have your blood pressure measured twice-once when you are at a hospital or clinic, and once when you are not at a  hospital or clinic. Record the average of the two measurements. To check your blood pressure when you are not at a hospital or clinic, you can use: ? An automated blood pressure machine at a pharmacy. ? A home blood pressure monitor.  Talk to your health care provider about your target blood pressure.  If you are between 66-57 years old, ask your health care provider if you should take aspirin to prevent heart disease.  Have regular diabetes screenings by checking your fasting blood sugar level. ? If you are at a normal weight and have a low risk for diabetes, have this test once every three years after the age of 87. ? If you are overweight and have a high risk for diabetes, consider being tested at a younger age or more often.  A one-time screening for abdominal aortic aneurysm (AAA) by ultrasound is recommended for men aged 43-75 years who are current or former smokers. What should I know about preventing infection? Hepatitis B If you have a higher risk for hepatitis B, you should be screened for this virus. Talk with your health care provider to find out if you are at risk for hepatitis B infection. Hepatitis C Blood testing is recommended for:  Everyone born from [birth year] through 1965.  Anyone with known risk factors for hepatitis C. Sexually Transmitted Diseases (STDs)  You should be screened each year for STDs including gonorrhea and chlamydia if: ? You are sexually active and are younger than 78 years of age. ? You are older than 78 years of age and your health care provider tells you that you are at risk for this type of infection. ? Your sexual activity has changed since you were last screened and you are at an increased risk for chlamydia or gonorrhea. Ask your health care provider if you are at risk.  Talk with your health care provider about whether you are at high risk of being infected with HIV. Your health care provider may recommend a prescription medicine to help prevent  HIV infection. What else can I do?  Schedule regular health, dental, and eye exams.  Stay current with your vaccines (immunizations).  Do not use any tobacco products, such as cigarettes, chewing tobacco, and e-cigarettes. If you need help quitting, ask your health care provider.  Limit alcohol intake to no more than 2 drinks per day. One drink equals 12 ounces of beer, 5 ounces of wine, or 1 ounces of hard liquor.  Do not use street drugs.  Do not share needles.  Ask your health care provider for help if you need support or information about quitting drugs.  Tell your health care provider if you often feel depressed.  Tell your health care provider if you have ever been abused or do not feel safe at home. This information is not intended to replace advice given to you by your health care provider. Make sure you discuss any questions you have with your health care provider. Document Released: 01/20/2008 Document Revised: 03/22/2016 Document Reviewed: 04/27/2015 Elsevier Interactive Patient Education  2019 [employer].

## 2018-08-13 NOTE — Addendum Note (Signed)
Addended by: Karren Cobble on: 08/13/2018 11:36 AM   Modules accepted: Orders

## 2018-08-13 NOTE — Assessment & Plan Note (Signed)
Hytrin 

## 2018-08-13 NOTE — Assessment & Plan Note (Signed)
Nex[employer] 

## 2018-08-13 NOTE — Progress Notes (Addendum)
Subjective:  Patient ID: Brian Mora, male    DOB: August 26, 1940  Age: 78 y.o. MRN: [id number]  CC: No chief complaint on file.   HPI Brian Mora presents for HTN, GERD C/o itchy spot on the R upper back x years; worse  Outpatient Medications Prior to Visit  Medication Sig Dispense Refill  . acetaminophen (TYLENOL) 325 MG tablet Take 2 tablets (650 mg total) by mouth every 6 (six) hours as needed for mild pain (or Fever >/= 101).    Marland Kitchen aspirin EC 325 MG EC tablet 1 tab a day for the next 30 days to prevent blood clots 30 tablet 0  . Cholecalciferol (VITAMIN D3) 2000 units capsule Take 1 capsule (2,000 Units total) by mouth daily. 100 capsule 3  . docusate sodium (COLACE) 100 MG capsule Take 1 capsule (100 mg total) by mouth 2 (two) times daily. 10 capsule 0  . esomeprazole (NEXIUM) 40 MG capsule Take 1 capsule (40 mg total) by mouth daily. 30 capsule 11  . loratadine (CLARITIN) 10 MG tablet Take 10 mg by mouth daily as needed for allergies.    Marland Kitchen OVER THE COUNTER MEDICATION 1 tablet. OTC antacid from [facility]    . polyethylene glycol (MIRALAX / GLYCOLAX) packet Take 17 g by mouth 2 (two) times daily. 60 each 0  . Simethicone (GAS-X PO) Take 1 tablet by mouth as needed.    . terazosin (HYTRIN) 1 MG capsule TAKE ONE CAPSULE AT BEDTIME 30 capsule 10  . tolterodine (DETROL LA) 4 MG 24 hr capsule Take 1 capsule (4 mg total) by mouth daily. 30 capsule 11  . traMADol (ULTRAM) 50 MG tablet Take 1 tablet (50 mg total) by mouth every 8 (eight) hours as needed for severe pain. 90 tablet 3  . triamcinolone cream (KENALOG) 0.1 % Apply 1 application topically 2 (two) times daily. 160 g 3   No facility-administered medications prior to visit.     ROS: Review of Systems  Constitutional: Negative for appetite change, fatigue and unexpected weight change.  HENT: Negative for congestion, nosebleeds, sneezing, sore throat and trouble swallowing.   Eyes: Negative for itching and visual  disturbance.  Respiratory: Negative for cough.   Cardiovascular: Negative for chest pain, palpitations and leg swelling.  Gastrointestinal: Negative for abdominal distention, blood in stool, diarrhea and nausea.  Genitourinary: Negative for frequency and hematuria.  Musculoskeletal: Negative for back pain, gait problem, joint swelling and neck pain.  Skin: Positive for color change and rash.  Neurological: Negative for dizziness, tremors, speech difficulty and weakness.  Psychiatric/Behavioral: Negative for agitation, dysphoric mood and sleep disturbance. The patient is not nervous/anxious.     Objective:  BP 128/78 (BP Location: Left Arm, Patient Position: Sitting, Cuff Size: Large)   Pulse (!) 108   Temp 98 F (36.7 C) (Oral)   Ht 6\' 2"  (1.88 m)   Wt 213 lb (96.6 kg)   SpO2 96%   BMI 27.35 kg/m   BP Readings from Last 3 Encounters:  08/13/18 128/78  05/09/18 132/86  02/06/18 130/90    Wt Readings from Last 3 Encounters:  08/13/18 213 lb (96.6 kg)  05/09/18 216 lb (98 kg)  02/06/18 216 lb (98 kg)    Physical Exam Constitutional:      General: He is not in acute distress.    Appearance: He is well-developed.     Comments: NAD  Eyes:     Conjunctiva/sclera: Conjunctivae normal.     Pupils: Pupils are equal,  round, and reactive to light.  Neck:     Musculoskeletal: Normal range of motion.     Thyroid: No thyromegaly.     Vascular: No JVD.  Cardiovascular:     Rate and Rhythm: Normal rate and regular rhythm.     Heart sounds: Normal heart sounds. No murmur. No friction rub. No gallop.   Pulmonary:     Effort: Pulmonary effort is normal. No respiratory distress.     Breath sounds: Normal breath sounds. No wheezing or rales.  Chest:     Chest wall: No tenderness.  Abdominal:     General: Bowel sounds are normal. There is no distension.     Palpations: Abdomen is soft. There is no mass.     Tenderness: There is no abdominal tenderness. There is no guarding or  rebound.  Musculoskeletal: Normal range of motion.        General: No tenderness.  Lymphadenopathy:     Cervical: No cervical adenopathy.  Skin:    General: Skin is warm and dry.     Findings: Lesion present. No rash.  Neurological:     Mental Status: He is alert and oriented to person, place, and time.     Cranial Nerves: No cranial nerve deficit.     Motor: No abnormal muscle tone.     Coordination: Coordination normal.     Gait: Gait normal.     Deep Tendon Reflexes: Reflexes are normal and symmetric.  Psychiatric:        Behavior: Behavior normal.        Thought Content: Thought content normal.        Judgment: Judgment normal.   Procedure: skin lesion steroid injection Reason: skin lesion, symptomatic - R upper back 6x4 cm Verbal consent was obtained.  Skin was cleaned with betadine and alcohol.   R upper back skin lesion was injected with 40 mg of DepoMedrol and 0.5 cc 1% Lidocaine.  Tolerated well Complications none Band-aid applied   Lab Results  Component Value Date   WBC 5.0 08/29/2016   HGB 13.6 08/29/2016   HCT 40.6 08/29/2016   PLT 215.0 08/29/2016   GLUCOSE 111 (H) 05/09/2018   CHOL 218 (H) 11/06/2017   TRIG 147.0 11/06/2017   HDL 41.60 11/06/2017   LDLDIRECT 168.0 08/12/2015   LDLCALC 147 (H) 11/06/2017   ALT 21 08/29/2016   AST 21 08/29/2016   NA 140 05/09/2018   K 4.2 05/09/2018   CL 103 05/09/2018   CREATININE 1.12 05/09/2018   BUN 13 05/09/2018   CO2 32 05/09/2018   TSH 1.00 11/06/2017   PSA 13.09 (H) 05/09/2018   INR 1.02 10/29/2014   HGBA1C 7.0 (H) 05/09/2018    US Abdomen Complete  Result Date: 03/13/2016 CLINICAL DATA:  Abdominal pain . EXAM: ABDOMEN ULTRASOUND COMPLETE COMPARISON:  None. FINDINGS: Gallbladder: No gallstones or wall thickening visualized. No sonographic Murphy sign noted by sonographer. Common bile duct: Diameter: 5.7 mm Liver: There is echogenic consistent fatty infiltration and/or hepatocellular disease. No focal  hepatic abnormality identified. IVC: No abnormality visualized. Pancreas: Visualized portion unremarkable. Spleen: Size and appearance within normal limits. Right Kidney: Length: 12.2 cm. Echogenicity within normal limits. No mass or hydronephrosis visualized. Left Kidney: Length: 11.9 cm. Echogenicity within normal limits. No mass or hydronephrosis visualized. 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 Other findings: None. IMPRESSION: 1. Liver is echogenic consistent fatty infiltration and/or hepatocellular disease . No acute abnormality. 2. 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 Ectatic  abdominal aorta at risk for aneurysm development. Recommend followup by ultrasound in 5 years. This recommendation follows ACR consensus guidelines: White Paper of the ACR Incidental Findings Committee II on Vascular Findings. J Am Coll Radiol 2013; 10:789-794. Electronically Signed   By: Marcello Moores  Register   On: 03/13/2016 12:29    Assessment & Plan:   There are no diagnoses linked to this encounter.   No orders of the defined types were placed in this encounter.    Follow-up: No follow-ups on file.  Walker Kehr, MD

## 2018-08-13 NOTE — Assessment & Plan Note (Signed)
Skin lesion on upper back w/ppruritis See procedure

## 2018-09-05 ENCOUNTER — Other Ambulatory Visit: Payer: Self-pay | Admitting: Internal Medicine

## 2018-10-20 ENCOUNTER — Other Ambulatory Visit: Payer: Self-pay | Admitting: Internal Medicine

## 2018-11-14 ENCOUNTER — Ambulatory Visit: Payer: Medicare Other | Admitting: Internal Medicine

## 2018-11-18 ENCOUNTER — Ambulatory Visit (INDEPENDENT_AMBULATORY_CARE_PROVIDER_SITE_OTHER): Payer: Medicare Other | Admitting: Internal Medicine

## 2018-11-18 ENCOUNTER — Other Ambulatory Visit: Payer: Self-pay

## 2018-11-18 ENCOUNTER — Encounter: Payer: Self-pay | Admitting: Internal Medicine

## 2018-11-18 DIAGNOSIS — R3915 Urgency of urination: Secondary | ICD-10-CM

## 2018-11-18 DIAGNOSIS — I10 Essential (primary) hypertension: Secondary | ICD-10-CM | POA: Diagnosis not present

## 2018-11-18 DIAGNOSIS — M1991 Primary osteoarthritis, unspecified site: Secondary | ICD-10-CM | POA: Diagnosis not present

## 2018-11-18 DIAGNOSIS — E782 Mixed hyperlipidemia: Secondary | ICD-10-CM | POA: Diagnosis not present

## 2018-11-18 MED ORDER — MELOXICAM 7.5 MG PO TABS: ORAL_TABLET | ORAL | 3 refills | Status: DC

## 2018-11-18 MED ORDER — TRAMADOL HCL 50 MG PO TABS: 50.0000 mg | ORAL_TABLET | Freq: Three times a day (TID) | ORAL | 3 refills | Status: DC | PRN

## 2018-11-18 NOTE — Assessment & Plan Note (Signed)
Hytrin is helpful

## 2018-11-18 NOTE — Assessment & Plan Note (Signed)
On Hytrin.  Continue to monitor be met 

## 2018-11-18 NOTE — Assessment & Plan Note (Signed)
Statin intolerant 

## 2018-11-18 NOTE — Progress Notes (Signed)
Subjective:  Patient ID: Brian Mora, male    DOB: 08/27/1940  Age: 78 y.o. MRN: [id number]  CC: No chief complaint on file.   HPI Brian Mora presents for OA complaints - shoulders, arms. No joint swelling, stiffness Follow-up hypertension, dyslipidemia  Outpatient Medications Prior to Visit  Medication Sig Dispense Refill  . acetaminophen (TYLENOL) 325 MG tablet Take 2 tablets (650 mg total) by mouth every 6 (six) hours as needed for mild pain (or Fever >/= 101).    Marland Kitchen aspirin EC 325 MG EC tablet 1 tab a day for the next 30 days to prevent blood clots 30 tablet 0  . Cholecalciferol (VITAMIN D3) 2000 units capsule Take 1 capsule (2,000 Units total) by mouth daily. 100 capsule 3  . docusate sodium (COLACE) 100 MG capsule Take 1 capsule (100 mg total) by mouth 2 (two) times daily. 10 capsule 0  . esomeprazole (NEXIUM) 40 MG capsule TAKE 1 CAPSULE BY MOUTH EVERY DAY 90 capsule 3  . loratadine (CLARITIN) 10 MG tablet Take 10 mg by mouth daily as needed for allergies.    Marland Kitchen OVER THE COUNTER MEDICATION 1 tablet. OTC antacid from [facility]    . polyethylene glycol (MIRALAX / GLYCOLAX) packet Take 17 g by mouth 2 (two) times daily. 60 each 0  . Simethicone (GAS-X PO) Take 1 tablet by mouth as needed.    . terazosin (HYTRIN) 1 MG capsule TAKE ONE CAPSULE AT BEDTIME 90 capsule 3  . tolterodine (DETROL LA) 4 MG 24 hr capsule Take 1 capsule (4 mg total) by mouth daily. 30 capsule 11  . traMADol (ULTRAM) 50 MG tablet Take 1 tablet (50 mg total) by mouth every 8 (eight) hours as needed for severe pain. 90 tablet 3  . triamcinolone cream (KENALOG) 0.1 % Apply 1 application topically 2 (two) times daily. 160 g 3   No facility-administered medications prior to visit.     ROS: Review of Systems  Constitutional: Negative for appetite change, fatigue and unexpected weight change.  HENT: Negative for congestion, nosebleeds, sneezing, sore throat and trouble swallowing.   Eyes: Negative for  itching and visual disturbance.  Respiratory: Negative for cough.   Cardiovascular: Negative for chest pain, palpitations and leg swelling.  Gastrointestinal: Negative for abdominal distention, blood in stool, diarrhea and nausea.  Genitourinary: Negative for frequency and hematuria.  Musculoskeletal: Positive for arthralgias. Negative for back pain, gait problem, joint swelling, myalgias and neck pain.  Skin: Negative for rash.  Neurological: Negative for dizziness, tremors, speech difficulty and weakness.  Psychiatric/Behavioral: Negative for agitation, dysphoric mood and sleep disturbance. The patient is not nervous/anxious.     Objective:  There were no vitals taken for this visit.  BP Readings from Last 3 Encounters:  08/13/18 128/78  08/13/18 128/78  05/09/18 132/86    Wt Readings from Last 3 Encounters:  08/13/18 213 lb (96.6 kg)  08/13/18 213 lb (96.6 kg)  05/09/18 216 lb (98 kg)    Physical Exam Constitutional:      General: He is not in acute distress.    Appearance: He is well-developed.     Comments: NAD  Eyes:     Conjunctiva/sclera: Conjunctivae normal.     Pupils: Pupils are equal, round, and reactive to light.  Neck:     Musculoskeletal: Normal range of motion.     Thyroid: No thyromegaly.     Vascular: No JVD.  Cardiovascular:     Rate and Rhythm: Normal rate and regular  rhythm.     Heart sounds: Normal heart sounds. No murmur. No friction rub. No gallop.   Pulmonary:     Effort: Pulmonary effort is normal. No respiratory distress.     Breath sounds: Normal breath sounds. No wheezing or rales.  Chest:     Chest wall: No tenderness.  Abdominal:     General: Bowel sounds are normal. There is no distension.     Palpations: Abdomen is soft. There is no mass.     Tenderness: There is no abdominal tenderness. There is no guarding or rebound.  Musculoskeletal:        General: No swelling, tenderness or deformity.  Lymphadenopathy:     Cervical: No  cervical adenopathy.  Skin:    General: Skin is warm and dry.     Findings: No rash.  Neurological:     Mental Status: He is alert and oriented to person, place, and time.     Cranial Nerves: No cranial nerve deficit.     Motor: No abnormal muscle tone.     Coordination: Coordination normal.     Gait: Gait normal.     Deep Tendon Reflexes: Reflexes are normal and symmetric.  Psychiatric:        Behavior: Behavior normal.        Thought Content: Thought content normal.        Judgment: Judgment normal.   Both shoulder joints are tender with range of motion Muscles are nontender  Lab Results  Component Value Date   WBC 5.0 08/29/2016   HGB 13.6 08/29/2016   HCT 40.6 08/29/2016   PLT 215.0 08/29/2016   GLUCOSE 111 (H) 05/09/2018   CHOL 218 (H) 11/06/2017   TRIG 147.0 11/06/2017   HDL 41.60 11/06/2017   LDLDIRECT 168.0 08/12/2015   LDLCALC 147 (H) 11/06/2017   ALT 21 08/29/2016   AST 21 08/29/2016   NA 140 05/09/2018   K 4.2 05/09/2018   CL 103 05/09/2018   CREATININE 1.12 05/09/2018   BUN 13 05/09/2018   CO2 32 05/09/2018   TSH 1.00 11/06/2017   PSA 13.09 (H) 05/09/2018   INR 1.02 10/29/2014   HGBA1C 7.0 (H) 05/09/2018    US Abdomen Complete  Result Date: 03/13/2016 CLINICAL DATA:  Abdominal pain . EXAM: ABDOMEN ULTRASOUND COMPLETE COMPARISON:  None. FINDINGS: Gallbladder: No gallstones or wall thickening visualized. No sonographic Murphy sign noted by sonographer. Common bile duct: Diameter: 5.7 mm Liver: There is echogenic consistent fatty infiltration and/or hepatocellular disease. No focal hepatic abnormality identified. IVC: No abnormality visualized. Pancreas: Visualized portion unremarkable. Spleen: Size and appearance within normal limits. Right Kidney: Length: 12.2 cm. Echogenicity within normal limits. No mass or hydronephrosis visualized. Left Kidney: Length: 11.9 cm. Echogenicity within normal limits. No mass or hydronephrosis visualized. Abdominal aorta: Mild  abdominal aortic ectasia 2.6 cm. Other findings: None. IMPRESSION: 1. Liver is echogenic consistent fatty infiltration and/or hepatocellular disease . No acute abnormality. 2. 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 Recommend followup by ultrasound in 5 years. This recommendation follows ACR consensus guidelines: White Paper of the ACR Incidental Findings Committee II on Vascular Findings. J Am Coll Radiol 2013; 10:789-794. Electronically Signed   By: Marcello Moores  Register   On: 03/13/2016 12:29    Assessment & Plan:   There are no diagnoses linked to this encounter.   No orders of the defined types were placed in this encounter.    Follow-up: No follow-ups on file.  Walker Kehr, MD

## 2018-11-18 NOTE — Assessment & Plan Note (Signed)
No signs or symptoms of inflammatory arthritis: Continue with PRN tramadol.  Start meloxicam 7.5-15 mg daily as needed. Labs/x-rays if needed

## 2019-03-19 ENCOUNTER — Other Ambulatory Visit: Payer: Self-pay | Admitting: Internal Medicine

## 2019-08-20 ENCOUNTER — Ambulatory Visit: Payer: Medicare Other

## 2019-08-28 ENCOUNTER — Ambulatory Visit: Payer: Medicare Other | Admitting: Internal Medicine

## 2019-08-28 ENCOUNTER — Other Ambulatory Visit: Payer: Self-pay

## 2019-08-28 ENCOUNTER — Encounter: Payer: Self-pay | Admitting: Internal Medicine

## 2019-08-28 VITALS — BP 132/86 | HR 110 | Temp 98.0°F | Ht 74.0 in | Wt 214.5 lb

## 2019-08-28 DIAGNOSIS — R972 Elevated prostate specific antigen [PSA]: Secondary | ICD-10-CM | POA: Diagnosis not present

## 2019-08-28 DIAGNOSIS — R739 Hyperglycemia, unspecified: Secondary | ICD-10-CM | POA: Diagnosis not present

## 2019-08-28 DIAGNOSIS — I10 Essential (primary) hypertension: Secondary | ICD-10-CM

## 2019-08-28 DIAGNOSIS — K219 Gastro-esophageal reflux disease without esophagitis: Secondary | ICD-10-CM | POA: Diagnosis not present

## 2019-08-28 DIAGNOSIS — M1991 Primary osteoarthritis, unspecified site: Secondary | ICD-10-CM | POA: Diagnosis not present

## 2019-08-28 DIAGNOSIS — E782 Mixed hyperlipidemia: Secondary | ICD-10-CM | POA: Diagnosis not present

## 2019-08-28 LAB — CBC WITH DIFFERENTIAL/PLATELET
Basophils Absolute: 0 10*3/uL (ref 0.0–0.1)
Basophils Relative: 0.7 % (ref 0.0–3.0)
Eosinophils Absolute: 0.1 10*3/uL (ref 0.0–0.7)
Eosinophils Relative: 1.6 % (ref 0.0–5.0)
HCT: 41.2 % (ref 39.0–52.0)
Hemoglobin: 13.5 g/dL (ref 13.0–17.0)
Lymphocytes Relative: 25.3 % (ref 12.0–46.0)
Lymphs Abs: 1.1 10*3/uL (ref 0.7–4.0)
MCHC: 32.8 g/dL (ref 30.0–36.0)
MCV: 88.1 fl (ref 78.0–100.0)
Monocytes Absolute: 0.4 10*3/uL (ref 0.1–1.0)
Monocytes Relative: 9.5 % (ref 3.0–12.0)
Neutro Abs: 2.7 10*3/uL (ref 1.4–7.7)
Neutrophils Relative %: 62.9 % (ref 43.0–77.0)
Platelets: 184 10*3/uL (ref 150.0–400.0)
RBC: 4.67 Mil/uL (ref 4.22–5.81)
RDW: 14.5 % (ref 11.5–15.5)
WBC: 4.2 10*3/uL (ref 4.0–10.5)

## 2019-08-28 LAB — URINALYSIS
Bilirubin Urine: NEGATIVE
Hgb urine dipstick: NEGATIVE
Ketones, ur: NEGATIVE
Leukocytes,Ua: NEGATIVE
Nitrite: NEGATIVE
Specific Gravity, Urine: 1.025 (ref 1.000–1.030)
Total Protein, Urine: NEGATIVE
Urine Glucose: NEGATIVE
Urobilinogen, UA: 0.2 (ref 0.0–1.0)
pH: 5.5 (ref 5.0–8.0)

## 2019-08-28 LAB — HEPATIC FUNCTION PANEL
ALT: 18 U/L (ref 0–53)
AST: 20 U/L (ref 0–37)
Albumin: 4.3 g/dL (ref 3.5–5.2)
Alkaline Phosphatase: 74 U/L (ref 39–117)
Bilirubin, Direct: 0.1 mg/dL (ref 0.0–0.3)
Total Bilirubin: 0.5 mg/dL (ref 0.2–1.2)
Total Protein: 7.2 g/dL (ref 6.0–8.3)

## 2019-08-28 LAB — BASIC METABOLIC PANEL
BUN: 15 mg/dL (ref 6–23)
CO2: 29 mEq/L (ref 19–32)
Calcium: 9.4 mg/dL (ref 8.4–10.5)
Chloride: 104 mEq/L (ref 96–112)
Creatinine, Ser: 1.02 mg/dL (ref 0.40–1.50)
GFR: 85.43 mL/min (ref >60.00)
Glucose, Bld: 111 mg/dL — ABNORMAL HIGH (ref 70–99)
Potassium: 3.9 mEq/L (ref 3.5–5.1)
Sodium: 139 mEq/L (ref 135–145)

## 2019-08-28 LAB — HEMOGLOBIN A1C: Hgb A1c MFr Bld: 7 % — ABNORMAL HIGH (ref 4.6–6.5)

## 2019-08-28 LAB — LIPID PANEL
Cholesterol: 227 mg/dL — ABNORMAL HIGH (ref 0–200)
HDL: 51 mg/dL (ref >39.00)
LDL Cholesterol: 144 mg/dL — ABNORMAL HIGH (ref 0–99)
NonHDL: 176.49
Total CHOL/HDL Ratio: 4
Triglycerides: 162 mg/dL — ABNORMAL HIGH (ref 0.0–149.0)
VLDL: 32.4 mg/dL (ref 0.0–40.0)

## 2019-08-28 LAB — PSA: PSA: 19.13 ng/mL — ABNORMAL HIGH (ref 0.10–4.00)

## 2019-08-28 LAB — TSH: TSH: 0.83 u[IU]/mL (ref 0.35–4.50)

## 2019-08-28 NOTE — Assessment & Plan Note (Addendum)
PSA The pt refused Urology f/u

## 2019-08-28 NOTE — Assessment & Plan Note (Addendum)
Not better on Meloxicam Rx. Using BC powders. Tramadol prn

## 2019-08-28 NOTE — Progress Notes (Signed)
Subjective:  Patient ID: Brian Mora, male    DOB: 11/13/1940  Age: 79 y.o. MRN: [id number]  CC: No chief complaint on file.   HPI Brian Mora presents for OA. Not better on Meloxicam Rx. Using BC powders. F/u GERD, allergies, BPH  Outpatient Medications Prior to Visit  Medication Sig Dispense Refill  . acetaminophen (TYLENOL) 325 MG tablet Take 2 tablets (650 mg total) by mouth every 6 (six) hours as needed for mild pain (or Fever >/= 101).    Marland Kitchen aspirin EC 325 MG EC tablet 1 tab a day for the next 30 days to prevent blood clots 30 tablet 0  . Cholecalciferol (VITAMIN D3) 2000 units capsule Take 1 capsule (2,000 Units total) by mouth daily. 100 capsule 3  . docusate sodium (COLACE) 100 MG capsule Take 1 capsule (100 mg total) by mouth 2 (two) times daily. 10 capsule 0  . esomeprazole (NEXIUM) 40 MG capsule TAKE 1 CAPSULE BY MOUTH EVERY DAY 90 capsule 3  . loratadine (CLARITIN) 10 MG tablet Take 10 mg by mouth daily as needed for allergies.    . polyethylene glycol (MIRALAX / GLYCOLAX) packet Take 17 g by mouth 2 (two) times daily. 60 each 0  . Simethicone (GAS-X PO) Take 1 tablet by mouth as needed.    . terazosin (HYTRIN) 1 MG capsule TAKE ONE CAPSULE AT BEDTIME 90 capsule 3  . tolterodine (DETROL LA) 4 MG 24 hr capsule Take 1 capsule (4 mg total) by mouth daily. 30 capsule 11  . triamcinolone cream (KENALOG) 0.1 % Apply 1 application topically 2 (two) times daily. 160 g 3  . meloxicam (MOBIC) 7.5 MG tablet TAKE 1 TABLET 1-2 TIMES DAILY AS NEEDED FOR ARTHRITIS (Patient not taking: Reported on 08/28/2019) 60 tablet 3  . OVER THE COUNTER MEDICATION 1 tablet. OTC antacid from [facility]    . traMADol (ULTRAM) 50 MG tablet Take 1 tablet (50 mg total) by mouth every 8 (eight) hours as needed for severe pain. (Patient not taking: Reported on 08/28/2019) 90 tablet 3   No facility-administered medications prior to visit.    ROS: Review of Systems  Constitutional: Negative for  appetite change, fatigue and unexpected weight change.  HENT: Negative for congestion, nosebleeds, sneezing, sore throat and trouble swallowing.   Eyes: Negative for itching and visual disturbance.  Respiratory: Negative for cough.   Cardiovascular: Negative for chest pain, palpitations and leg swelling.  Gastrointestinal: Negative for abdominal distention, blood in stool, diarrhea and nausea.  Genitourinary: Negative for frequency and hematuria.  Musculoskeletal: Positive for arthralgias and back pain. Negative for gait problem, joint swelling and neck pain.  Skin: Negative for rash.  Neurological: Negative for dizziness, tremors, speech difficulty and weakness.  Psychiatric/Behavioral: Negative for agitation, dysphoric mood and sleep disturbance. The patient is not nervous/anxious.     Objective:  BP 132/86 (BP Location: Left Arm, Patient Position: Sitting, Cuff Size: Normal)   Pulse (!) 110   Temp 98 F (36.7 C) (Oral)   Ht 6\' 2"  (1.88 m)   Wt 214 lb 8 oz (97.3 kg)   SpO2 93%   BMI 27.54 kg/m   BP Readings from Last 3 Encounters:  08/28/19 132/86  08/13/18 128/78  08/13/18 128/78    Wt Readings from Last 3 Encounters:  08/28/19 214 lb 8 oz (97.3 kg)  08/13/18 213 lb (96.6 kg)  08/13/18 213 lb (96.6 kg)    Physical Exam Constitutional:      General: He is  not in acute distress.    Appearance: He is well-developed. He is obese.     Comments: NAD  Eyes:     Conjunctiva/sclera: Conjunctivae normal.     Pupils: Pupils are equal, round, and reactive to light.  Neck:     Thyroid: No thyromegaly.     Vascular: No JVD.  Cardiovascular:     Rate and Rhythm: Normal rate and regular rhythm.     Heart sounds: Normal heart sounds. No murmur. No friction rub. No gallop.   Pulmonary:     Effort: Pulmonary effort is normal. No respiratory distress.     Breath sounds: Normal breath sounds. No wheezing or rales.  Chest:     Chest wall: No tenderness.  Abdominal:     General:  Bowel sounds are normal. There is no distension.     Palpations: Abdomen is soft. There is no mass.     Tenderness: There is no abdominal tenderness. There is no guarding or rebound.  Musculoskeletal:        General: Tenderness present. Normal range of motion.     Cervical back: Normal range of motion.  Lymphadenopathy:     Cervical: No cervical adenopathy.  Skin:    General: Skin is warm and dry.     Findings: No rash.  Neurological:     Mental Status: He is alert and oriented to person, place, and time.     Cranial Nerves: No cranial nerve deficit.     Motor: No abnormal muscle tone.     Coordination: Coordination normal.     Gait: Gait normal.     Deep Tendon Reflexes: Reflexes are normal and symmetric.  Psychiatric:        Behavior: Behavior normal.        Thought Content: Thought content normal.        Judgment: Judgment normal.   LS and shoulders - pain w/ROM  Lab Results  Component Value Date   WBC 5.0 08/29/2016   HGB 13.6 08/29/2016   HCT 40.6 08/29/2016   PLT 215.0 08/29/2016   GLUCOSE 111 (H) 05/09/2018   CHOL 218 (H) 11/06/2017   TRIG 147.0 11/06/2017   HDL 41.60 11/06/2017   LDLDIRECT 168.0 08/12/2015   LDLCALC 147 (H) 11/06/2017   ALT 21 08/29/2016   AST 21 08/29/2016   NA 140 05/09/2018   K 4.2 05/09/2018   CL 103 05/09/2018   CREATININE 1.12 05/09/2018   BUN 13 05/09/2018   CO2 32 05/09/2018   TSH 1.00 11/06/2017   PSA 13.09 (H) 05/09/2018   INR 1.02 10/29/2014   HGBA1C 7.0 (H) 05/09/2018    US Abdomen Complete  Result Date: 03/13/2016 CLINICAL DATA:  Abdominal pain . EXAM: ABDOMEN ULTRASOUND COMPLETE COMPARISON:  None. FINDINGS: Gallbladder: No gallstones or wall thickening visualized. No sonographic Murphy sign noted by sonographer. Common bile duct: Diameter: 5.7 mm Liver: There is echogenic consistent fatty infiltration and/or hepatocellular disease. No focal hepatic abnormality identified. IVC: No abnormality visualized. Pancreas: Visualized  portion unremarkable. Spleen: Size and appearance within normal limits. Right Kidney: Length: 12.2 cm. Echogenicity within normal limits. No mass or hydronephrosis visualized. Left Kidney: Length: 11.9 cm. Echogenicity within normal limits. No mass or hydronephrosis visualized. 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 Other findings: None. IMPRESSION: 1. Liver is echogenic consistent fatty infiltration and/or hepatocellular disease . No acute abnormality. 2. 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 Recommend followup by ultrasound in  5 years. This recommendation follows ACR consensus guidelines: White Paper of the ACR Incidental Findings Committee II on Vascular Findings. J Am Coll Radiol 2013; 10:789-794. Electronically Signed   By: Marcello Moores  Register   On: 03/13/2016 12:29    Assessment & Plan:   There are no diagnoses linked to this encounter.   No orders of the defined types were placed in this encounter.    Follow-up: No follow-ups on file.  Walker Kehr, MD

## 2019-08-28 NOTE — Assessment & Plan Note (Signed)
Nexium qd

## 2019-08-28 NOTE — Assessment & Plan Note (Signed)
Hytrin Labs

## 2019-12-10 ENCOUNTER — Other Ambulatory Visit: Payer: Self-pay | Admitting: Internal Medicine

## 2019-12-13 ENCOUNTER — Other Ambulatory Visit: Payer: Self-pay | Admitting: Internal Medicine

## 2020-01-19 ENCOUNTER — Other Ambulatory Visit: Payer: Self-pay | Admitting: Internal Medicine

## 2020-08-24 ENCOUNTER — Ambulatory Visit: Payer: Medicare Other | Admitting: Internal Medicine

## 2020-08-27 ENCOUNTER — Encounter: Payer: Self-pay | Admitting: Internal Medicine

## 2020-08-27 ENCOUNTER — Other Ambulatory Visit: Payer: Self-pay

## 2020-08-27 ENCOUNTER — Ambulatory Visit: Payer: Medicare Other | Admitting: Internal Medicine

## 2020-08-27 VITALS — BP 130/80 | HR 80 | Temp 98.5°F | Ht 74.0 in | Wt 221.4 lb

## 2020-08-27 DIAGNOSIS — R7309 Other abnormal glucose: Secondary | ICD-10-CM

## 2020-08-27 DIAGNOSIS — E785 Hyperlipidemia, unspecified: Secondary | ICD-10-CM

## 2020-08-27 DIAGNOSIS — M21612 Bunion of left foot: Secondary | ICD-10-CM | POA: Insufficient documentation

## 2020-08-27 DIAGNOSIS — L309 Dermatitis, unspecified: Secondary | ICD-10-CM

## 2020-08-27 DIAGNOSIS — R972 Elevated prostate specific antigen [PSA]: Secondary | ICD-10-CM

## 2020-08-27 DIAGNOSIS — I10 Essential (primary) hypertension: Secondary | ICD-10-CM

## 2020-08-27 LAB — URINALYSIS
Bilirubin Urine: NEGATIVE
Hgb urine dipstick: NEGATIVE
Ketones, ur: NEGATIVE
Leukocytes,Ua: NEGATIVE
Nitrite: NEGATIVE
Specific Gravity, Urine: 1.025 (ref 1.000–1.030)
Total Protein, Urine: NEGATIVE
Urine Glucose: NEGATIVE
Urobilinogen, UA: 0.2 (ref 0.0–1.0)
pH: 6.5 (ref 5.0–8.0)

## 2020-08-27 LAB — COMPREHENSIVE METABOLIC PANEL
ALT: 19 U/L (ref 0–53)
AST: 19 U/L (ref 0–37)
Albumin: 4.4 g/dL (ref 3.5–5.2)
Alkaline Phosphatase: 72 U/L (ref 39–117)
BUN: 13 mg/dL (ref 6–23)
CO2: 29 mEq/L (ref 19–32)
Calcium: 9.8 mg/dL (ref 8.4–10.5)
Chloride: 103 mEq/L (ref 96–112)
Creatinine, Ser: 1.17 mg/dL (ref 0.40–1.50)
GFR: 59.44 mL/min — ABNORMAL LOW (ref >60.00)
Glucose, Bld: 107 mg/dL — ABNORMAL HIGH (ref 70–99)
Potassium: 3.7 mEq/L (ref 3.5–5.1)
Sodium: 140 mEq/L (ref 135–145)
Total Bilirubin: 0.5 mg/dL (ref 0.2–1.2)
Total Protein: 7.3 g/dL (ref 6.0–8.3)

## 2020-08-27 LAB — LIPID PANEL
Cholesterol: 257 mg/dL — ABNORMAL HIGH (ref 0–200)
HDL: 52.4 mg/dL (ref >39.00)
NonHDL: 204.34
Total CHOL/HDL Ratio: 5
Triglycerides: 202 mg/dL — ABNORMAL HIGH (ref 0.0–149.0)
VLDL: 40.4 mg/dL — ABNORMAL HIGH (ref 0.0–40.0)

## 2020-08-27 LAB — CBC WITH DIFFERENTIAL/PLATELET
Basophils Absolute: 0 10*3/uL (ref 0.0–0.1)
Basophils Relative: 0.6 % (ref 0.0–3.0)
Eosinophils Absolute: 0.1 10*3/uL (ref 0.0–0.7)
Eosinophils Relative: 2.6 % (ref 0.0–5.0)
HCT: 39.8 % (ref 39.0–52.0)
Hemoglobin: 13.3 g/dL (ref 13.0–17.0)
Lymphocytes Relative: 28.9 % (ref 12.0–46.0)
Lymphs Abs: 1.3 10*3/uL (ref 0.7–4.0)
MCHC: 33.5 g/dL (ref 30.0–36.0)
MCV: 86.9 fl (ref 78.0–100.0)
Monocytes Absolute: 0.5 10*3/uL (ref 0.1–1.0)
Monocytes Relative: 11.9 % (ref 3.0–12.0)
Neutro Abs: 2.5 10*3/uL (ref 1.4–7.7)
Neutrophils Relative %: 56 % (ref 43.0–77.0)
Platelets: 201 10*3/uL (ref 150.0–400.0)
RBC: 4.58 Mil/uL (ref 4.22–5.81)
RDW: 13.7 % (ref 11.5–15.5)
WBC: 4.5 10*3/uL (ref 4.0–10.5)

## 2020-08-27 LAB — MICROALBUMIN / CREATININE URINE RATIO
Creatinine,U: 170.2 mg/dL
Microalb Creat Ratio: 0.4 mg/g (ref 0.0–30.0)
Microalb, Ur: 0.7 mg/dL (ref 0.0–1.9)

## 2020-08-27 LAB — HEMOGLOBIN A1C: Hgb A1c MFr Bld: 7.2 % — ABNORMAL HIGH (ref 4.6–6.5)

## 2020-08-27 LAB — TSH: TSH: 1.15 u[IU]/mL (ref 0.35–4.50)

## 2020-08-27 LAB — PSA: PSA: 28.78 ng/mL — ABNORMAL HIGH (ref 0.10–4.00)

## 2020-08-27 LAB — LDL CHOLESTEROL, DIRECT: Direct LDL: 164 mg/dL

## 2020-08-27 MED ORDER — TRIAMCINOLONE ACETONIDE 0.1 % EX CREA: TOPICAL_CREAM | CUTANEOUS | 3 refills | Status: DC

## 2020-08-27 NOTE — Assessment & Plan Note (Signed)
A1c

## 2020-08-27 NOTE — Patient Instructions (Signed)
[facility]  Our Podiatrists & Specialists Our podiatrists are foot surgery specialists with over 100 years of combined private practice experience in podiatric medicine and are Board-Certified and Board-Qualified by the American Board of Foot & Ankle Surgery.  Dr. Wallene Huh, DPM, FACFAS Dr. Wallene Huh, DPM, FACFAS Podiatrist - Certified in [place] Surgery by the [place] [facility] [place]  A native of [place], [place], Dr. Paulla Dolly attended [facility]. He received his medical degree from the [facility] of Podiatric Medicine. His residency training was completed at the [facility] in [place], [place]. In private practice since 1987, Dr. Paulla Dolly is board certified in foot surgery by the American Board of Foot and Ankle Surgery.  Dr. Lanelle Bal, DPM, FACFAS Dr. Lanelle Bal, DPM, FACFAS Podiatrist - Certified in [place] Surgery by the [place] of Foot & Ankle Surgery  Dr. Milinda Pointer is a native of [place], [place], and graduated from [employer]. He received his medical degree from the [facility] of Podiatric Medicine. His residency training was completed at the [facility], in [facility] in [place]. At the [facility] Dr. Milinda Pointer specialized in vascular disease and surgery as well as wound healing. Dr. Milinda Pointer then completed a Podiatric Surgical residency in [place], [place]. He is Soil scientist in foot surgery by the [place] and Ankle Surgery.  Dr. Bonna Gains. Jacqualyn Posey, DPM, FACFAS Dr. Bonna Gains. Jacqualyn Posey, DPM, FACFAS Podiatrist - Certified in [place] Surgery by the [place] [facility]  Originally from [place], [place], Dr. Jacqualyn Posey is board certified in certified in foot surgery by the [place] and Ankle Surgery. 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 Dr. Jacqualyn Posey also completed comprehensive training in a four-year residency, gaining experience in forefoot, rearfoot, traumatic, reconstructive surgery, and limb salvage.  Dr. Lady Saucier T. Cannon Kettle, DPM, AACFAS Dr. Lady Saucier T. Cannon Kettle, DPM, [place], [place] native, Dr. Landis Martins graduated from [facility] [facility] [employer] [facility] with a Bachelor of [employer] degree before attending [employer] in [place], [place], where she earned her Doctor of Podiatric medicine with a dual Conservator, museum/gallery in [place]. Dr. Cannon Kettle was also a Hydrographic surveyor physician of Podiatric Medicine and Surgery at the [facility] in [place], [place]; and a Fellow of Sports Medicine and Surgery at the [place] [facility] [place] [facility].  Dr. Edrick Kins, DPM, AACFAS Dr. Edrick Kins, DPM, AACFAS Podiatrist  Dr. Daylene Katayama, DPM is a podiatric surgeon with specialized training in surgical reconstruction of the foot and ankle. He has an extensive background in lower extremity trauma, surgical reconstruction of foot and ankle deformities, and sports injuries. Originally from [place], [place], Dr. Amalia Hailey attended the [place] before studying at [employer] of Podiatric Medicine and Scientist, water quality at the internationally recognized [facility] in [place], [place]. In his free time, Dr. Amalia Hailey enjoys all outdoor activities as well as spending time with his wife and three children.  Dr. Evelina Bucy, DPM, AACFAS, FACPM Dr. Evelina Bucy, DPM, AACFAS,  FACPM Podiatrist - Board Certified, American Board of Podiatric Medicine  Originally from [place], [place], Dr. Hardie Pulley completed pre-medical studies at the [facility], where he majored in [employer], [employer], and Philosophy. He then graduated from [employer] in [place], where he received his Doctor of Podiatric Medicine. After earning his medical degree, he completed his residency at [facility] [facility]. Dr. Eleanora Neighbor surgical training focused on reconstructive surgery of the foot and ankle, which included specialized training in limb salvage, and minimally invasive surgery. In his free time, he enjoys traveling, running, and spending time with his family.  Dr. Thomasene Lot. Posey Pronto, DPM, AACFAS Dr. Thomasene Lot. Posey Pronto, DPM, AACFAS Podiatrist  Originally from [place], [place], Dr. Boneta Lucks graduated from [facility] with a Paediatric nurse in [place]. He then obtained his Doctor of Podiatric Medicine (DPM) degree from [facility] [place] in [place]. 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 His comprehensive training included reconstructive surgery for various foot and ankle deformities, lower extremity trauma, sports injuries, and limb salvage. In his free time, he enjoys playing tennis, traveling, and spending time with his family.  Criselda Peaches, DPM, AACFAS Criselda Peaches, DPM, AACFAS Podiatrist  Dr. Sherryle Lis is a native of [place], [place] and a graduate of the [place] [employer] [place] [facility] [place] with a Paediatric nurse in Education officer, museum, and a Programmer, multimedia Medicine from [place] in [employer], [place]. He completed a 4-year residency in foot and ankle surgery at the [place], followed by a 1-year clinical research fellowship in [employer]. He has extensive experience and interests in treating forefoot, hindfoot and ankle  deformities, sports medicine, arthroscopy, arthritis, total ankle joint replacement, traumatic injuries, Charcot joint disease, and diabetic limb salvage. His primary goal is helping patients get back on their feet to their pre-injury levels of activity. He believes in maximizing non-operative treatment while also providing individualized and innovative surgical options when necessary and emphasizes patient-centered therapies and education to identify the best treatments to suit their lifestyles.  Dr. Phylliss Blakes. Prudence Davidson, DPM Dr. Phylliss Blakes. Prudence Davidson, DPM Podiatrist  [place], [place] native Dr. 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 Dr. Prudence Davidson is Board Certified in foot surgery by the American Board of Foot and Ankle Surgery.  Acquanetta Sit, DPM Acquanetta Sit, DPM Podiatrist  Originally from [place], [place], Dr. Acquanetta Sit received her B. S. in Biology from [facility] [employer], where she was a member of the [employer]. She received her DPM Agricultural engineer of Podiatric Medicine) from [facility] in [place], [place], where she was the recipient of the [place]. She completed a Arts administrator Medicine residency at the [facility] in [place], completing rotations at [facility]. Dr. Elisha Ponder continued her postgraduate training at [place] completing Rotating Podiatric and Podiatric Surgical Residencies. Dr. Elisha Ponder enjoys reading, traveling, exploring different cultures, documentaries, and watching sunrises and sunsets on the beach.  Dr. Loyal Buba, DPM Dr. Loyal Buba, DPM Podiatrist  Originally from [place], [place], Dr. Valentina Lucks holds a Bachelor of  Science Degree  in Exercise Science from [employer]. She received her Doctorate in [place] from [employer] [facility] [place] in [place], and completed her residency at [facility] in [place]. During her three-year residency she was trained in trained in foot surgery, rear foot and ankle surgery as well as wound care and limb salvage.  Velora Heckler, CPED COF Velora Heckler, CPED COF Pedorthist  Originally from [place], [place], [employer] completed his undergraduate study at [facility] [employer] [facility] and post-graduate work at both [employer] and [employer] of [employer]. Liliane Channel is a board-certified pedorthist and orthotic fitter with specialized training in lower extremity biomechanics and pathologies. He has extensive experience in casting and fabricating various orthotics; shoe modifications; and bracing to address specific conditions and dysfunctions of the foot, ankle, and knee. Liliane Channel also specializes in diabetic foot ulcer prevention, pediatric bracing, and customized sports orthotics that enable the competitive athlete to perform to their fullest potential. He and his wife, Shirlean Mylar, share a passion for bringing clean water and educational opportunities to vulnerable children in both [place] and [place].

## 2020-08-27 NOTE — Assessment & Plan Note (Signed)
On Hytrin BP normalized in the office CMET

## 2020-08-27 NOTE — Assessment & Plan Note (Signed)
On Triamcinolone Rx renewed

## 2020-08-27 NOTE — Progress Notes (Signed)
Subjective:  Patient ID: Brian Mora, male    DOB: [birth year]  Age: 80 y.o. MRN: [id number]  CC: No chief complaint on file.   HPI Brian Mora presents for HTN, OA, DM, elevated PSA f/u  Outpatient Medications Prior to Visit  Medication Sig Dispense Refill  . acetaminophen (TYLENOL) 325 MG tablet Take 2 tablets (650 mg total) by mouth every 6 (six) hours as needed for mild pain (or Fever >/= 101).    Marland Kitchen aspirin EC 325 MG EC tablet 1 tab a day for the next 30 days to prevent blood clots 30 tablet 0  . Cholecalciferol (VITAMIN D3) 2000 units capsule Take 1 capsule (2,000 Units total) by mouth daily. 100 capsule 3  . docusate sodium (COLACE) 100 MG capsule Take 1 capsule (100 mg total) by mouth 2 (two) times daily. 10 capsule 0  . esomeprazole (NEXIUM) 40 MG capsule TAKE 1 CAPSULE BY MOUTH EVERY DAY 90 capsule 2  . loratadine (CLARITIN) 10 MG tablet Take 10 mg by mouth daily as needed for allergies.    . meloxicam (MOBIC) 7.5 MG tablet TAKE 1 TABLET 1-2 TIMES DAILY AS NEEDED FOR ARTHRITIS 60 tablet 3  . OVER THE COUNTER MEDICATION 1 tablet. OTC antacid from [facility]    . polyethylene glycol (MIRALAX / GLYCOLAX) packet Take 17 g by mouth 2 (two) times daily. 60 each 0  . Simethicone (GAS-X PO) Take 1 tablet by mouth as needed.    . terazosin (HYTRIN) 1 MG capsule TAKE 1 CAPSULE BY MOUTH EVERYDAY AT BEDTIME 90 capsule 3  . tolterodine (DETROL LA) 4 MG 24 hr capsule Take 1 capsule (4 mg total) by mouth daily. 30 capsule 11  . traMADol (ULTRAM) 50 MG tablet Take 1 tablet (50 mg total) by mouth every 8 (eight) hours as needed for severe pain. 90 tablet 3  . triamcinolone cream (KENALOG) 0.1 % APPLY  CREAM EXTERNALLY TWICE DAILY 160 g 0   No facility-administered medications prior to visit.    ROS: Review of Systems  Constitutional: Negative for appetite change, fatigue and unexpected weight change.  HENT: Negative for congestion, nosebleeds, sneezing, sore throat and trouble  swallowing.   Eyes: Negative for itching and visual disturbance.  Respiratory: Negative for cough.   Cardiovascular: Negative for chest pain, palpitations and leg swelling.  Gastrointestinal: Negative for abdominal distention, blood in stool, diarrhea and nausea.  Genitourinary: Negative for frequency and hematuria.  Musculoskeletal: Positive for arthralgias and back pain. Negative for gait problem, joint swelling and neck pain.  Skin: Negative for rash.  Neurological: Negative for dizziness, tremors, speech difficulty and weakness.  Psychiatric/Behavioral: Negative for agitation, dysphoric mood and sleep disturbance. The patient is not nervous/anxious.     Objective:  BP 130/80   Pulse 80   Temp 98.5 F (36.9 C) (Oral)   Ht 6\' 2"  (1.88 m)   Wt 221 lb 6.4 oz (100.4 kg)   SpO2 98%   BMI 28.43 kg/m   BP Readings from Last 3 Encounters:  08/27/20 130/80  08/28/19 132/86  08/13/18 128/78    Wt Readings from Last 3 Encounters:  08/27/20 221 lb 6.4 oz (100.4 kg)  08/28/19 214 lb 8 oz (97.3 kg)  08/13/18 213 lb (96.6 kg)    Physical Exam Constitutional:      General: He is not in acute distress.    Appearance: He is well-developed.     Comments: NAD  HENT:     Mouth/Throat:  Mouth: Oropharynx is clear and moist.  Eyes:     Conjunctiva/sclera: Conjunctivae normal.     Pupils: Pupils are equal, round, and reactive to light.  Neck:     Thyroid: No thyromegaly.     Vascular: No JVD.  Cardiovascular:     Rate and Rhythm: Normal rate and regular rhythm.     Pulses: Intact distal pulses.     Heart sounds: Normal heart sounds. No murmur heard. No friction rub. No gallop.   Pulmonary:     Effort: Pulmonary effort is normal. No respiratory distress.     Breath sounds: Normal breath sounds. No wheezing or rales.  Chest:     Chest wall: No tenderness.  Abdominal:     General: Bowel sounds are normal. There is no distension.     Palpations: Abdomen is soft. There is no  mass.     Tenderness: There is no abdominal tenderness. There is no guarding or rebound.  Musculoskeletal:        General: No tenderness or edema. Normal range of motion.     Cervical back: Normal range of motion.  Lymphadenopathy:     Cervical: No cervical adenopathy.  Skin:    General: Skin is warm and dry.     Findings: No rash.  Neurological:     Mental Status: He is alert and oriented to person, place, and time.     Cranial Nerves: No cranial nerve deficit.     Motor: No abnormal muscle tone.     Coordination: He displays a negative Romberg sign. Coordination normal.     Gait: Gait normal.     Deep Tendon Reflexes: Reflexes are normal and symmetric.  Psychiatric:        Mood and Affect: Mood and affect normal.        Behavior: Behavior normal.        Thought Content: Thought content normal.        Judgment: Judgment normal.   eczema L foot bunion   Lab Results  Component Value Date   WBC 4.2 08/28/2019   HGB 13.5 08/28/2019   HCT 41.2 08/28/2019   PLT 184.0 08/28/2019   GLUCOSE 111 (H) 08/28/2019   CHOL 227 (H) 08/28/2019   TRIG 162.0 (H) 08/28/2019   HDL 51.00 08/28/2019   LDLDIRECT 168.0 08/12/2015   LDLCALC 144 (H) 08/28/2019   ALT 18 08/28/2019   AST 20 08/28/2019   NA 139 08/28/2019   K 3.9 08/28/2019   CL 104 08/28/2019   CREATININE 1.02 08/28/2019   BUN 15 08/28/2019   CO2 29 08/28/2019   TSH 0.83 08/28/2019   PSA 19.13 (H) 08/28/2019   INR 1.02 10/29/2014   HGBA1C 7.0 (H) 08/28/2019    US Abdomen Complete  Result Date: 03/13/2016 CLINICAL DATA:  Abdominal pain . EXAM: ABDOMEN ULTRASOUND COMPLETE COMPARISON:  None. FINDINGS: Gallbladder: No gallstones or wall thickening visualized. No sonographic Murphy sign noted by sonographer. Common bile duct: Diameter: 5.7 mm Liver: There is echogenic consistent fatty infiltration and/or hepatocellular disease. No focal hepatic abnormality identified. IVC: No abnormality visualized. Pancreas: Visualized portion  unremarkable. Spleen: Size and appearance within normal limits. Right Kidney: Length: 12.2 cm. Echogenicity within normal limits. No mass or hydronephrosis visualized. Left Kidney: Length: 11.9 cm. Echogenicity within normal limits. No mass or hydronephrosis visualized. 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 Other findings: None. IMPRESSION: 1. Liver is echogenic consistent fatty infiltration and/or hepatocellular disease . No acute abnormality. 2. Mild abdominal  aortic ectasia 2.6 cm. Ectatic abdominal aorta at risk for aneurysm development. Recommend followup by ultrasound in 5 years. This recommendation follows ACR consensus guidelines: White Paper of the ACR Incidental Findings Committee II on Vascular Findings. J Am Coll Radiol 2013; 10:789-794. Electronically Signed   By: Marcello Moores  Register   On: 03/13/2016 12:29    Assessment & Plan:   There are no diagnoses linked to this encounter.   No orders of the defined types were placed in this encounter.    Follow-up: No follow-ups on file.  Walker Kehr, MD

## 2020-08-27 NOTE — Assessment & Plan Note (Addendum)
Possible prostate cancer.  Discussed with patient.  Check PSA and a free PSA to stratify his risks better.

## 2020-08-27 NOTE — Assessment & Plan Note (Signed)
He was offered to be ref to [facility]

## 2020-09-13 ENCOUNTER — Other Ambulatory Visit: Payer: Self-pay | Admitting: Internal Medicine

## 2020-11-01 ENCOUNTER — Other Ambulatory Visit: Payer: Self-pay

## 2020-11-02 ENCOUNTER — Encounter: Payer: Self-pay | Admitting: Internal Medicine

## 2020-11-02 ENCOUNTER — Other Ambulatory Visit: Payer: Self-pay | Admitting: Internal Medicine

## 2020-11-02 ENCOUNTER — Ambulatory Visit: Payer: Medicare Other | Admitting: Internal Medicine

## 2020-11-02 DIAGNOSIS — I10 Essential (primary) hypertension: Secondary | ICD-10-CM

## 2020-11-02 DIAGNOSIS — R972 Elevated prostate specific antigen [PSA]: Secondary | ICD-10-CM

## 2020-11-02 DIAGNOSIS — G720 Drug-induced myopathy: Secondary | ICD-10-CM | POA: Diagnosis not present

## 2020-11-02 DIAGNOSIS — E782 Mixed hyperlipidemia: Secondary | ICD-10-CM | POA: Diagnosis not present

## 2020-11-02 DIAGNOSIS — R3915 Urgency of urination: Secondary | ICD-10-CM | POA: Diagnosis not present

## 2020-11-02 DIAGNOSIS — T466X5A Adverse effect of antihyperlipidemic and antiarteriosclerotic drugs, initial encounter: Secondary | ICD-10-CM

## 2020-11-02 MED ORDER — TRAMADOL HCL 50 MG PO TABS: 25.0000 mg | ORAL_TABLET | Freq: Four times a day (QID) | ORAL | 0 refills | Status: DC | PRN

## 2020-11-02 MED ORDER — TOLTERODINE TARTRATE ER 4 MG PO CP24: 4.0000 mg | ORAL_CAPSULE | Freq: Every day | ORAL | 3 refills | Status: DC

## 2020-11-02 MED ORDER — TRIAMCINOLONE ACETONIDE 0.1 % EX CREA: TOPICAL_CREAM | CUTANEOUS | 3 refills | Status: DC

## 2020-11-02 NOTE — Assessment & Plan Note (Signed)
Off statins 

## 2020-11-02 NOTE — Progress Notes (Signed)
Subjective:  Patient ID: Brian Mora, male    DOB: 1941-07-22  Age: 80 y.o. MRN: [id number]  CC: Follow-up (3 MONTH F/U)   HPI Brian Mora presents for rash, OA, OAB  Outpatient Medications Prior to Visit  Medication Sig Dispense Refill  . acetaminophen (TYLENOL) 325 MG tablet Take 2 tablets (650 mg total) by mouth every 6 (six) hours as needed for mild pain (or Fever >/= 101).    Marland Kitchen aspirin EC 325 MG EC tablet 1 tab a day for the next 30 days to prevent blood clots 30 tablet 0  . Cholecalciferol (VITAMIN D3) 2000 units capsule Take 1 capsule (2,000 Units total) by mouth daily. 100 capsule 3  . docusate sodium (COLACE) 100 MG capsule Take 1 capsule (100 mg total) by mouth 2 (two) times daily. 10 capsule 0  . esomeprazole (NEXIUM) 40 MG capsule TAKE 1 CAPSULE BY MOUTH EVERY DAY 90 capsule 3  . loratadine (CLARITIN) 10 MG tablet Take 10 mg by mouth daily as needed for allergies.    . meloxicam (MOBIC) 7.5 MG tablet TAKE 1 TABLET 1-2 TIMES DAILY AS NEEDED FOR ARTHRITIS 60 tablet 3  . OVER THE COUNTER MEDICATION 1 tablet. OTC antacid from [facility]    . polyethylene glycol (MIRALAX / GLYCOLAX) packet Take 17 g by mouth 2 (two) times daily. 60 each 0  . Simethicone (GAS-X PO) Take 1 tablet by mouth as needed.    . terazosin (HYTRIN) 1 MG capsule TAKE 1 CAPSULE BY MOUTH EVERYDAY AT BEDTIME 90 capsule 3  . tolterodine (DETROL LA) 4 MG 24 hr capsule Take 1 capsule (4 mg total) by mouth daily. 30 capsule 11  . traMADol (ULTRAM) 50 MG tablet Take 1 tablet (50 mg total) by mouth every 8 (eight) hours as needed for severe pain. 90 tablet 3  . triamcinolone (KENALOG) 0.1 % APPLY  CREAM EXTERNALLY TWICE DAILY 160 g 3   No facility-administered medications prior to visit.    ROS: Review of Systems  Constitutional: Negative for appetite change, fatigue and unexpected weight change.  HENT: Negative for congestion, nosebleeds, sneezing, sore throat and trouble swallowing.   Eyes: Negative  for itching and visual disturbance.  Respiratory: Negative for cough.   Cardiovascular: Negative for chest pain, palpitations and leg swelling.  Gastrointestinal: Negative for abdominal distention, blood in stool, diarrhea and nausea.  Genitourinary: Negative for frequency and hematuria.  Musculoskeletal: Positive for arthralgias. Negative for back pain, gait problem, joint swelling and neck pain.  Skin: Negative for rash.  Neurological: Negative for dizziness, tremors, speech difficulty and weakness.  Psychiatric/Behavioral: Negative for agitation, dysphoric mood and sleep disturbance. The patient is not nervous/anxious.     Objective:  BP (!) 162/82 (BP Location: Left Arm)   Pulse 92   Temp 98.2 F (36.8 C) (Oral)   Ht 6\' 2"  (1.88 m)   Wt 219 lb 3.2 oz (99.4 kg)   SpO2 98%   BMI 28.14 kg/m   BP Readings from Last 3 Encounters:  11/02/20 (!) 162/82  08/27/20 130/80  08/28/19 132/86    Wt Readings from Last 3 Encounters:  11/02/20 219 lb 3.2 oz (99.4 kg)  08/27/20 221 lb 6.4 oz (100.4 kg)  08/28/19 214 lb 8 oz (97.3 kg)    Physical Exam Constitutional:      General: He is not in acute distress.    Appearance: He is well-developed. He is obese.     Comments: NAD  Eyes:  Conjunctiva/sclera: Conjunctivae normal.     Pupils: Pupils are equal, round, and reactive to light.  Neck:     Thyroid: No thyromegaly.     Vascular: No JVD.  Cardiovascular:     Rate and Rhythm: Normal rate and regular rhythm.     Heart sounds: Normal heart sounds. No murmur heard. No friction rub. No gallop.   Pulmonary:     Effort: Pulmonary effort is normal. No respiratory distress.     Breath sounds: Normal breath sounds. No wheezing or rales.  Chest:     Chest wall: No tenderness.  Abdominal:     General: Bowel sounds are normal. There is no distension.     Palpations: Abdomen is soft. There is no mass.     Tenderness: There is no abdominal tenderness. There is no guarding or rebound.   Musculoskeletal:        General: Tenderness present. Normal range of motion.     Cervical back: Normal range of motion.  Lymphadenopathy:     Cervical: No cervical adenopathy.  Skin:    General: Skin is warm and dry.     Findings: No rash.  Neurological:     Mental Status: He is alert and oriented to person, place, and time.     Cranial Nerves: No cranial nerve deficit.     Motor: No abnormal muscle tone.     Coordination: Coordination normal.     Gait: Gait normal.     Deep Tendon Reflexes: Reflexes are normal and symmetric.  Psychiatric:        Behavior: Behavior normal.        Thought Content: Thought content normal.        Judgment: Judgment normal.   LS, shoulders painful L knee s/p TKR  Lab Results  Component Value Date   WBC 4.5 08/27/2020   HGB 13.3 08/27/2020   HCT 39.8 08/27/2020   PLT 201.0 08/27/2020   GLUCOSE 107 (H) 08/27/2020   CHOL 257 (H) 08/27/2020   TRIG 202.0 (H) 08/27/2020   HDL 52.40 08/27/2020   LDLDIRECT 164.0 08/27/2020   LDLCALC 144 (H) 08/28/2019   ALT 19 08/27/2020   AST 19 08/27/2020   NA 140 08/27/2020   K 3.7 08/27/2020   CL 103 08/27/2020   CREATININE 1.17 08/27/2020   BUN 13 08/27/2020   CO2 29 08/27/2020   TSH 1.15 08/27/2020   PSA 28.78 (H) 08/27/2020   INR 1.02 10/29/2014   HGBA1C 7.2 (H) 08/27/2020   MICROALBUR 0.7 08/27/2020    US Abdomen Complete  Result Date: 03/13/2016 CLINICAL DATA:  Abdominal pain . EXAM: ABDOMEN ULTRASOUND COMPLETE COMPARISON:  None. FINDINGS: Gallbladder: No gallstones or wall thickening visualized. No sonographic Murphy sign noted by sonographer. Common bile duct: Diameter: 5.7 mm Liver: There is echogenic consistent fatty infiltration and/or hepatocellular disease. No focal hepatic abnormality identified. IVC: No abnormality visualized. Pancreas: Visualized portion unremarkable. Spleen: Size and appearance within normal limits. Right Kidney: Length: 12.2 cm. Echogenicity within normal limits. No mass  or hydronephrosis visualized. Left Kidney: Length: 11.9 cm. Echogenicity within normal limits. No mass or hydronephrosis visualized. 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 Other findings: None. IMPRESSION: 1. Liver is echogenic consistent fatty infiltration and/or hepatocellular disease . No acute abnormality. 2. 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 Recommend followup by ultrasound in 5 years. This recommendation follows ACR consensus guidelines: White Paper of the ACR Incidental Findings Committee II on  Vascular Findings. J Am Coll Radiol 2013; 10:789-794. Electronically Signed   By: Marcello Moores  Register   On: 03/13/2016 12:29    Assessment & Plan:     Follow-up: No follow-ups on file.  Walker Kehr, MD

## 2020-11-02 NOTE — Assessment & Plan Note (Signed)
Cont w/Hytrin, Detrol LA

## 2020-11-02 NOTE — Assessment & Plan Note (Addendum)
Monitor PSA/free psa Pt refused Urol appt Risks discussed - ?prostate cancer

## 2020-11-09 ENCOUNTER — Other Ambulatory Visit: Payer: Self-pay | Admitting: Internal Medicine

## 2020-11-22 ENCOUNTER — Other Ambulatory Visit: Payer: Self-pay | Admitting: Internal Medicine

## 2021-02-03 ENCOUNTER — Ambulatory Visit: Payer: Medicare Other | Admitting: Internal Medicine

## 2021-02-03 ENCOUNTER — Encounter: Payer: Self-pay | Admitting: Internal Medicine

## 2021-02-03 ENCOUNTER — Other Ambulatory Visit: Payer: Self-pay

## 2021-02-03 DIAGNOSIS — K219 Gastro-esophageal reflux disease without esophagitis: Secondary | ICD-10-CM | POA: Diagnosis not present

## 2021-02-03 DIAGNOSIS — M1991 Primary osteoarthritis, unspecified site: Secondary | ICD-10-CM

## 2021-02-03 DIAGNOSIS — I10 Essential (primary) hypertension: Secondary | ICD-10-CM

## 2021-02-03 DIAGNOSIS — R972 Elevated prostate specific antigen [PSA]: Secondary | ICD-10-CM | POA: Diagnosis not present

## 2021-02-03 DIAGNOSIS — R7309 Other abnormal glucose: Secondary | ICD-10-CM | POA: Diagnosis not present

## 2021-02-03 LAB — COMPREHENSIVE METABOLIC PANEL
ALT: 22 U/L (ref 0–53)
AST: 24 U/L (ref 0–37)
Albumin: 4.3 g/dL (ref 3.5–5.2)
Alkaline Phosphatase: 82 U/L (ref 39–117)
BUN: 16 mg/dL (ref 6–23)
CO2: 28 mEq/L (ref 19–32)
Calcium: 9.5 mg/dL (ref 8.4–10.5)
Chloride: 103 mEq/L (ref 96–112)
Creatinine, Ser: 1.08 mg/dL (ref 0.40–1.50)
GFR: 65.23 mL/min (ref >60.00)
Glucose, Bld: 140 mg/dL — ABNORMAL HIGH (ref 70–99)
Potassium: 3.9 mEq/L (ref 3.5–5.1)
Sodium: 141 mEq/L (ref 135–145)
Total Bilirubin: 0.7 mg/dL (ref 0.2–1.2)
Total Protein: 7.4 g/dL (ref 6.0–8.3)

## 2021-02-03 LAB — PSA: PSA: 28.34 ng/mL — ABNORMAL HIGH (ref 0.10–4.00)

## 2021-02-03 MED ORDER — METHYLPREDNISOLONE 4 MG PO TBPK: ORAL_TABLET | ORAL | 0 refills | Status: DC

## 2021-02-03 MED ORDER — TRIAMCINOLONE ACETONIDE 0.1 % EX CREA: TOPICAL_CREAM | CUTANEOUS | 3 refills | Status: DC

## 2021-02-03 NOTE — Assessment & Plan Note (Signed)
Nexium qd  Potential benefits of a long term PPI use as well as potential risks  and complications were explained to the patient and were aknowledged.

## 2021-02-03 NOTE — Addendum Note (Signed)
Addended by: Boris Lown B on: 02/03/2021 10:49 AM   Modules accepted: Orders

## 2021-02-03 NOTE — Assessment & Plan Note (Signed)
Monitor PSA 

## 2021-02-03 NOTE — Assessment & Plan Note (Signed)
Check A1c. 

## 2021-02-03 NOTE — Progress Notes (Signed)
Subjective:  Patient ID: Brian Mora, male    DOB: 1940-11-05  Age: 80 y.o. MRN: [id number]  CC: Follow-up (3 month f/u- Ref on his triamcinolone cream)   HPI SHIVANK PINEDO presents for GERD, HTN, rash f/u  Outpatient Medications Prior to Visit  Medication Sig Dispense Refill   acetaminophen (TYLENOL) 325 MG tablet Take 2 tablets (650 mg total) by mouth every 6 (six) hours as needed for mild pain (or Fever >/= 101).     aspirin EC 325 MG EC tablet 1 tab a day for the next 30 days to prevent blood clots 30 tablet 0   Cholecalciferol (VITAMIN D3) 2000 units capsule Take 1 capsule (2,000 Units total) by mouth daily. 100 capsule 3   docusate sodium (COLACE) 100 MG capsule Take 1 capsule (100 mg total) by mouth 2 (two) times daily. 10 capsule 0   esomeprazole (NEXIUM) 40 MG capsule TAKE 1 CAPSULE BY MOUTH EVERY DAY 90 capsule 3   loratadine (CLARITIN) 10 MG tablet Take 10 mg by mouth daily as needed for allergies.     meloxicam (MOBIC) 7.5 MG tablet TAKE 1 TABLET 1-2 TIMES DAILY AS NEEDED FOR ARTHRITIS 60 tablet 3   OVER THE COUNTER MEDICATION 1 tablet. OTC antacid from [facility]     polyethylene glycol (MIRALAX / GLYCOLAX) packet Take 17 g by mouth 2 (two) times daily. 60 each 0   Simethicone (GAS-X PO) Take 1 tablet by mouth as needed.     terazosin (HYTRIN) 1 MG capsule TAKE 1 CAPSULE BY MOUTH EVERYDAY AT BEDTIME 90 capsule 3   tolterodine (DETROL LA) 4 MG 24 hr capsule Take 1 capsule (4 mg total) by mouth daily. 90 capsule 3   traMADol (ULTRAM) 50 MG tablet TAKE 0.5-1 TABLETS (25-50 MG TOTAL) BY MOUTH EVERY 6 (SIX) HOURS AS NEEDED FOR SEVERE PAIN. 100 tablet 1   triamcinolone (KENALOG) 0.1 % APPLY  CREAM EXTERNALLY TWICE DAILY 450 g 3   No facility-administered medications prior to visit.    ROS: Review of Systems  Constitutional:  Negative for appetite change, fatigue and unexpected weight change.  HENT:  Negative for congestion, nosebleeds, sneezing, sore throat and trouble  swallowing.   Eyes:  Negative for itching and visual disturbance.  Respiratory:  Negative for cough.   Cardiovascular:  Negative for chest pain, palpitations and leg swelling.  Gastrointestinal:  Negative for abdominal distention, blood in stool, diarrhea and nausea.  Genitourinary:  Negative for frequency and hematuria.  Musculoskeletal:  Positive for arthralgias and back pain. Negative for gait problem, joint swelling and neck pain.  Skin:  Positive for rash.  Neurological:  Negative for dizziness, tremors, speech difficulty and weakness.  Psychiatric/Behavioral:  Negative for agitation, dysphoric mood and sleep disturbance. The patient is not nervous/anxious.    Objective:  BP (!) 104/52 (BP Location: Left Arm)   Pulse (!) 107   Temp 98.9 F (37.2 C) (Oral)   Ht 6\' 2"  (1.88 m)   Wt 216 lb 3.2 oz (98.1 kg)   SpO2 96%   BMI 27.76 kg/m   BP Readings from Last 3 Encounters:  02/03/21 (!) 104/52  11/02/20 (!) 162/82  08/27/20 130/80    Wt Readings from Last 3 Encounters:  02/03/21 216 lb 3.2 oz (98.1 kg)  11/02/20 219 lb 3.2 oz (99.4 kg)  08/27/20 221 lb 6.4 oz (100.4 kg)    Physical Exam Constitutional:      General: He is not in acute distress.  Appearance: He is well-developed. He is obese.     Comments: NAD  Eyes:     Conjunctiva/sclera: Conjunctivae normal.     Pupils: Pupils are equal, round, and reactive to light.  Neck:     Thyroid: No thyromegaly.     Vascular: No JVD.  Cardiovascular:     Rate and Rhythm: Normal rate and regular rhythm.     Heart sounds: Normal heart sounds. No murmur heard.   No friction rub. No gallop.  Pulmonary:     Effort: Pulmonary effort is normal. No respiratory distress.     Breath sounds: Normal breath sounds. No wheezing or rales.  Chest:     Chest wall: No tenderness.  Abdominal:     General: Bowel sounds are normal. There is no distension.     Palpations: Abdomen is soft. There is no mass.     Tenderness: There is no  abdominal tenderness. There is no guarding or rebound.  Musculoskeletal:        General: Tenderness present. Normal range of motion.     Cervical back: Normal range of motion.  Lymphadenopathy:     Cervical: No cervical adenopathy.  Skin:    General: Skin is warm and dry.     Findings: No rash.  Neurological:     Mental Status: He is alert and oriented to person, place, and time.     Cranial Nerves: No cranial nerve deficit.     Motor: No abnormal muscle tone.     Coordination: Coordination normal.     Gait: Gait normal.     Deep Tendon Reflexes: Reflexes are normal and symmetric.  Psychiatric:        Behavior: Behavior normal.        Thought Content: Thought content normal.        Judgment: Judgment normal.    Lab Results  Component Value Date   WBC 4.5 08/27/2020   HGB 13.3 08/27/2020   HCT 39.8 08/27/2020   PLT 201.0 08/27/2020   GLUCOSE 107 (H) 08/27/2020   CHOL 257 (H) 08/27/2020   TRIG 202.0 (H) 08/27/2020   HDL 52.40 08/27/2020   LDLDIRECT 164.0 08/27/2020   LDLCALC 144 (H) 08/28/2019   ALT 19 08/27/2020   AST 19 08/27/2020   NA 140 08/27/2020   K 3.7 08/27/2020   CL 103 08/27/2020   CREATININE 1.17 08/27/2020   BUN 13 08/27/2020   CO2 29 08/27/2020   TSH 1.15 08/27/2020   PSA 28.78 (H) 08/27/2020   INR 1.02 10/29/2014   HGBA1C 7.2 (H) 08/27/2020   MICROALBUR 0.7 08/27/2020    US Abdomen Complete  Result Date: 03/13/2016 CLINICAL DATA:  Abdominal pain . EXAM: ABDOMEN ULTRASOUND COMPLETE COMPARISON:  None. FINDINGS: Gallbladder: No gallstones or wall thickening visualized. No sonographic Murphy sign noted by sonographer. Common bile duct: Diameter: 5.7 mm Liver: There is echogenic consistent fatty infiltration and/or hepatocellular disease. No focal hepatic abnormality identified. IVC: No abnormality visualized. Pancreas: Visualized portion unremarkable. Spleen: Size and appearance within normal limits. Right Kidney: Length: 12.2 cm. Echogenicity within normal  limits. No mass or hydronephrosis visualized. Left Kidney: Length: 11.9 cm. Echogenicity within normal limits. No mass or hydronephrosis visualized. 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 Other findings: None. IMPRESSION: 1. Liver is echogenic consistent fatty infiltration and/or hepatocellular disease . No acute abnormality. 2. 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 Recommend followup by ultrasound in 5 years. This recommendation follows  ACR consensus guidelines: White Paper of the ACR Incidental Findings Committee II on Vascular Findings. J Am Coll Radiol 2013; 10:789-794. Electronically Signed   By: Marcello Moores  Register   On: 03/13/2016 12:29    Assessment & Plan:   There are no diagnoses linked to this encounter.   Meds ordered this encounter  Medications   triamcinolone cream (KENALOG) 0.1 %    Sig: APPLY  CREAM EXTERNALLY TWICE DAILY    Dispense:  450 g    Refill:  3     Follow-up: No follow-ups on file.  Walker Kehr, MD

## 2021-02-03 NOTE — Assessment & Plan Note (Signed)
Cont w/Hytrin, Detrol LA

## 2021-02-03 NOTE — Assessment & Plan Note (Signed)
Worse Medrol pack  Potential benefits of steroid  use as well as potential risks  and complications were explained to the patient and were aknowledged.

## 2021-03-18 ENCOUNTER — Other Ambulatory Visit: Payer: Self-pay | Admitting: Internal Medicine

## 2021-03-21 ENCOUNTER — Encounter: Payer: Self-pay | Admitting: Emergency Medicine

## 2021-03-21 ENCOUNTER — Ambulatory Visit (INDEPENDENT_AMBULATORY_CARE_PROVIDER_SITE_OTHER): Payer: Medicare Other

## 2021-03-21 ENCOUNTER — Other Ambulatory Visit: Payer: Self-pay

## 2021-03-21 ENCOUNTER — Ambulatory Visit: Payer: Medicare Other | Admitting: Emergency Medicine

## 2021-03-21 DIAGNOSIS — M549 Dorsalgia, unspecified: Secondary | ICD-10-CM

## 2021-03-21 DIAGNOSIS — M545 Low back pain, unspecified: Secondary | ICD-10-CM

## 2021-03-21 LAB — POCT URINALYSIS DIP (MANUAL ENTRY)
Bilirubin, UA: NEGATIVE
Glucose, UA: NEGATIVE mg/dL
Ketones, POC UA: NEGATIVE mg/dL
Leukocytes, UA: NEGATIVE
Nitrite, UA: NEGATIVE
Spec Grav, UA: 1.015 (ref 1.010–1.025)
Urobilinogen, UA: 0.2 E.U./dL
pH, UA: 6 (ref 5.0–8.0)

## 2021-03-21 MED ORDER — TRAMADOL HCL 50 MG PO TABS
50.0000 mg | ORAL_TABLET | Freq: Three times a day (TID) | ORAL | 0 refills | Status: AC | PRN
Start: 2021-03-21 — End: 2021-03-26

## 2021-03-21 NOTE — Progress Notes (Signed)
Brian Mora 80 y.o.   Chief Complaint  Patient presents with   Back Pain    X 2 weeks, lower back, right side. Pt states pains comes and goes. Would like x rays.    HISTORY OF PRESENT ILLNESS: This is a 80 y.o. male complaining of right-sided lumbar pain for the past 2 weeks.  Sharp intermittent pain with no radiation or associated symptoms.  Back Pain Pertinent negatives include no abdominal pain, chest pain, dysuria, fever or headaches.    Prior to Admission medications   Medication Sig Start Date End Date Taking? Authorizing Provider  acetaminophen (TYLENOL) 325 MG tablet Take 2 tablets (650 mg total) by mouth every 6 (six) hours as needed for mild pain (or Fever >/= 101). 11/10/14  Yes Shepperson, Kirstin, PA-C  aspirin EC 325 MG EC tablet 1 tab a day for the next 30 days to prevent blood clots 11/10/14  Yes Shepperson, Kirstin, PA-C  Cholecalciferol (VITAMIN D3) 2000 units capsule Take 1 capsule (2,000 Units total) by mouth daily. 08/29/16  Yes Plotnikov, Evie Lacks, MD  docusate sodium (COLACE) 100 MG capsule Take 1 capsule (100 mg total) by mouth 2 (two) times daily. 11/10/14  Yes Shepperson, Kirstin, PA-C  esomeprazole (NEXIUM) 40 MG capsule TAKE 1 CAPSULE BY MOUTH EVERY DAY 09/13/20  Yes Plotnikov, Evie Lacks, MD  loratadine (CLARITIN) 10 MG tablet Take 10 mg by mouth daily as needed for allergies.   Yes [provider]  meloxicam (MOBIC) 7.5 MG tablet TAKE 1 TABLET 1-2 TIMES DAILY AS NEEDED FOR ARTHRITIS 03/19/19  Yes Plotnikov, Evie Lacks, MD  methylPREDNISolone (MEDROL DOSEPAK) 4 MG TBPK tablet TAKE AS DIRECTED 03/20/21  Yes Plotnikov, Evie Lacks, MD  OVER THE COUNTER MEDICATION 1 tablet. OTC antacid from [facility]   Yes [provider]  polyethylene glycol (MIRALAX / GLYCOLAX) packet Take 17 g by mouth 2 (two) times daily. 11/10/14  Yes Shepperson, Kirstin, PA-C  Simethicone (GAS-X PO) Take 1 tablet by mouth as needed.   Yes [provider]  terazosin (HYTRIN)  1 MG capsule TAKE 1 CAPSULE BY MOUTH EVERYDAY AT BEDTIME 11/09/20  Yes Plotnikov, Evie Lacks, MD  tolterodine (DETROL LA) 4 MG 24 hr capsule Take 1 capsule (4 mg total) by mouth daily. 11/02/20  Yes Plotnikov, Evie Lacks, MD  traMADol (ULTRAM) 50 MG tablet TAKE 0.5-1 TABLETS (25-50 MG TOTAL) BY MOUTH EVERY 6 (SIX) HOURS AS NEEDED FOR SEVERE PAIN. 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   Bunion, left 08/27/2020   Elevated PSA 06/06/2017   Elevated glucose 02/26/2017   Eczema 08/29/2016   Osteoarthritis 07/25/2016   Urinary urgency 02/18/2016   Primary localized osteoarthritis of left knee    Well adult exam 05/11/2014   HTN (hypertension) 05/11/2014   Hyperlipidemia 05/11/2014   Pain in lower limb 10/28/2013   Pain in joint, ankle and foot 04/30/2013   Onychomycosis 01/28/2013   Callus of foot 01/28/2013   GASTRIC OUTLET OBSTRUCTION 05/23/2008   HIATAL HERNIA 05/23/2008   GERD (gastroesophageal reflux disease) 01/29/2008    Past Medical History:  Diagnosis Date   Acquired hypertrophic pyloric stenosis    gastric outlet obstruction   Dyspepsia and  other specified disorders of function of stomach    GERD (gastroesophageal reflux disease)    nexium   as needed   Hyperlipidemia    Other esophagitis    erosive esophagitis   Primary localized osteoarthritis of left knee    Sexually transmitted disease (STD)    syphyillis in youth-medicdally treated   Urgency of urination     Past Surgical History:  Procedure Laterality Date   BUNIONECTOMY Right [place]   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   HERNIA REPAIR     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   Procedure: TOTAL KNEE ARTHROPLASTY;  Surgeon: Elsie Saas, MD;  Location: [facility];  Service: Orthopedics;  Laterality: Left;    Social History   Socioeconomic History   Marital status: Divorced    Spouse name: Not on file   Number of children: 3   Years of education: 12   Highest education level: Not on file  Occupational History   Occupation: retired   Occupation: Cab Education administrator: [employer]  Tobacco Use   Smoking status: Former    Packs/day: 0.50    Years: 20.00    Pack years: 10.00    Types: Cigarettes    Quit date: 01/29/1993    Years since quitting: 28.1   Smokeless tobacco: Never   Tobacco comments:    Quit in 1989  Substance and Sexual Activity   Alcohol use: No    Alcohol/week: 0.0 standard drinks   Drug use: No   Sexual activity: Yes  Other Topics Concern   Not on file  Social History Narrative   HSG. Married '65 - 12 yrs/divorce, Married '80 -1 yr/divorced. 1 dtr - '67; 2 sons '63, '79; 4 grandchildren. Lives alone. 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    Lives alone, son and daughter local, active in care.    Social Determinants of Health   Financial Resource Strain: Not on file  Food Insecurity: Not on file  Transportation Needs: Not on file  Physical Activity: Not on file  Stress: Not on file  Social Connections: Not on file  Intimate Partner Violence: Not on file    Family History  Problem Relation Age of Onset   Heart disease Mother    Stroke Mother    Benign prostatic hyperplasia Father    Alzheimer's disease Father    Cancer Sister    Diabetes Neg Hx    COPD Neg Hx      Review of Systems  Constitutional: Negative.  Negative for chills and fever.  HENT: Negative.  Negative for congestion and sore throat.   Respiratory: Negative.  Negative for cough and shortness of breath.   Cardiovascular: Negative.  Negative for chest pain and palpitations.  Gastrointestinal:  Negative for abdominal pain, diarrhea, nausea and vomiting.  Genitourinary:  Negative.  Negative for dysuria, flank pain and hematuria.  Musculoskeletal:  Positive for back pain.  Skin: Negative.  Negative for rash.  Neurological: Negative.  Negative for dizziness and headaches.  All other systems reviewed and are negative.   Physical Exam Vitals reviewed.  Constitutional:      Appearance: Normal appearance.  HENT:     Head: Normocephalic.  Eyes:     Extraocular Movements: Extraocular movements intact.     Pupils: Pupils are equal, round, and reactive to light.  Cardiovascular:     Rate and Rhythm: Normal rate and regular rhythm.  Pulses: Normal pulses.     Heart sounds: Normal heart sounds.  Pulmonary:     Effort: Pulmonary effort is normal.     Breath sounds: Normal breath sounds.  Abdominal:     General: Bowel sounds are normal.     Palpations: Abdomen is soft.     Tenderness: There is no abdominal tenderness.  Musculoskeletal:     Cervical back: Normal range of motion.     Lumbar back: Spasms and tenderness present. No bony tenderness.  Skin:    General: Skin is warm and dry.  Neurological:     General: No focal deficit present.     Mental Status: He is alert and oriented to person, place, and time.  Psychiatric:        Mood and Affect: Mood normal.        Behavior: Behavior normal.    Results for orders placed or performed in visit on 03/21/21 (from the past 24 hour(s))  POCT urinalysis dipstick     Status: Abnormal   Collection Time: 03/21/21  3:00 PM  Result Value Ref Range   Color, UA yellow yellow   Clarity, UA clear clear   Glucose, UA negative negative mg/dL   Bilirubin, UA negative negative   Ketones, POC UA negative negative mg/dL   Spec Grav, UA 1.015 1.010 - 1.025   Blood, UA trace-intact (A) negative   pH, UA 6.0 5.0 - 8.0   Protein Ur, POC trace (A) negative mg/dL   Urobilinogen, UA 0.2 0.2 or 1.0 E.U./dL   Nitrite, UA Negative Negative   Leukocytes, UA Negative Negative   DG Lumbar Spine 2-3 Views  Result Date:  03/21/2021 CLINICAL DATA:  Right-sided lumbar pain EXAM: LUMBAR SPINE - 2-3 VIEW COMPARISON:  None. FINDINGS: Five lumbar type vertebral bodies. There is no evidence of lumbar spine fracture. Levocurvature of the lumbar spine. Mild disc height loss at L4-L5. Facet arthropathy in the lower lumbar spine. 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 Electronically Signed   By: Merilyn Baba M.D.   On: 03/21/2021 15:01    ASSESSMENT & PLAN: Clinically stable.  No red flag signs or symptoms.  Most likely musculoskeletal pain. Advised to take Tylenol and or tramadol as needed.  ED precautions given. Must follow-up with PCP if no better or worse during the next couple of weeks.  Brian Mora was seen today for back pain.  Diagnoses and all orders for this visit:  Lumbar pain -     DG Lumbar Spine 2-3 Views -     POCT urinalysis dipstick -     traMADol (ULTRAM) 50 MG tablet; Take 1 tablet (50 mg total) by mouth every 8 (eight) hours as needed for up to 5 days.  Musculoskeletal back pain  Patient Instructions  Acute Back Pain, Adult Acute back pain is sudden and usually short-lived. It is often caused by an injury to the muscles and tissues in the back. The injury may result from: A muscle or ligament getting overstretched or torn (strained). Ligaments are tissues that connect bones to each other. Lifting something improperly can cause a back strain. Wear and tear (degeneration) of the spinal disks. Spinal disks are circular tissue that provide cushioning between the bones of the spine (vertebrae). Twisting motions, such as while playing sports or doing yard work. A hit to the back. Arthritis. You may have a physical exam, lab tests, and imaging tests to find the cause ofyour pain. Acute back pain  usually goes away with rest and home care. Follow these instructions at home: Managing pain, stiffness, and swelling Treatment may include medicines for pain and inflammation  that are taken by mouth or applied to the skin, prescription pain medicine, or muscle relaxants. Take over-the-counter and prescription medicines only as told by your health care provider. Your health care provider may recommend applying ice during the first 24-48 hours after your pain starts. To do this: Put ice in a plastic bag. Place a towel between your skin and the bag. Leave the ice on for 20 minutes, 2-3 times a day. If directed, apply heat to the affected area as often as told by your health care provider. Use the heat source that your health care provider recommends, such as a moist heat pack or a heating pad. Place a towel between your skin and the heat source. Leave the heat on for 20-30 minutes. Remove the heat if your skin turns bright red. This is especially important if you are unable to feel pain, heat, or cold. You have a greater risk of getting burned. Activity  Do not stay in bed. Staying in bed for more than 1-2 days can delay your recovery. Sit up and stand up straight. Avoid leaning forward when you sit or hunching over when you stand. If you work at a desk, sit close to it so you do not need to lean over. Keep your chin tucked in. Keep your neck drawn back, and keep your elbows bent at a 90-degree angle (right angle). Sit high and close to the steering wheel when you drive. Add lower back (lumbar) support to your car seat, if needed. Take short walks on even surfaces as soon as you are able. Try to increase the length of time you walk each day. Do not sit, drive, or stand in one place for more than 30 minutes at a time. Sitting or standing for long periods of time can put stress on your back. Do not drive or use heavy machinery while taking prescription pain medicine. Use proper lifting techniques. When you bend and lift, use positions that put less stress on your back: [place] your knees. Keep the load close to your body. Avoid twisting. Exercise regularly as told by your  health care provider. Exercising helps your back heal faster and helps prevent back injuries by keeping muscles strong and flexible. Work with a physical therapist to make a safe exercise program, as recommended by your health care provider. Do any exercises as told by your physical therapist.  Lifestyle Maintain a healthy weight. Extra weight puts stress on your back and makes it difficult to have good posture. Avoid activities or situations that make you feel anxious or stressed. Stress and anxiety increase muscle tension and can make back pain worse. Learn ways to manage anxiety and stress, such as through exercise. General instructions Sleep on a firm mattress in a comfortable position. Try lying on your side with your knees slightly bent. If you lie on your back, put a pillow under your knees. Follow your treatment plan as told by your health care provider. This may include: Cognitive or behavioral therapy. Acupuncture or massage therapy. Meditation or yoga. Contact a health care provider if: You have pain that is not relieved with rest or medicine. You have increasing pain going down into your legs or buttocks. Your pain does not improve after 2 weeks. You have pain at night. You lose weight without trying. You have a fever or chills.  Get help right away if: You develop new bowel or bladder control problems. You have unusual weakness or numbness in your arms or legs. You develop nausea or vomiting. You develop abdominal pain. You feel faint. Summary Acute back pain is sudden and usually short-lived. Use proper lifting techniques. When you bend and lift, use positions that put less stress on your back. Take over-the-counter and prescription medicines and apply heat or ice as directed by your health care provider. This information is not intended to replace advice given to you by your health care provider. Make sure you discuss any questions you have with your healthcare  provider. Document Revised: 04/13/2020 Document Reviewed: 04/16/2020 Elsevier Patient Education  2022 [place], MD [facility]

## 2021-03-21 NOTE — Patient Instructions (Signed)
Acute Back Pain, Adult Acute back pain is sudden and usually short-lived. It is often caused by an injury to the muscles and tissues in the back. The injury may result from: A muscle or ligament getting overstretched or torn (strained). Ligaments are tissues that connect bones to each other. Lifting something improperly can cause a back strain. Wear and tear (degeneration) of the spinal disks. Spinal disks are circular tissue that provide cushioning between the bones of the spine (vertebrae). Twisting motions, such as while playing sports or doing yard work. A hit to the back. Arthritis. You may have a physical exam, lab tests, and imaging tests to find the cause ofyour pain. Acute back pain usually goes away with rest and home care. Follow these instructions at home: Managing pain, stiffness, and swelling Treatment may include medicines for pain and inflammation that are taken by mouth or applied to the skin, prescription pain medicine, or muscle relaxants. Take over-the-counter and prescription medicines only as told by your health care provider. Your health care provider may recommend applying ice during the first 24-48 hours after your pain starts. To do this: Put ice in a plastic bag. Place a towel between your skin and the bag. Leave the ice on for 20 minutes, 2-3 times a day. If directed, apply heat to the affected area as often as told by your health care provider. Use the heat source that your health care provider recommends, such as a moist heat pack or a heating pad. Place a towel between your skin and the heat source. Leave the heat on for 20-30 minutes. Remove the heat if your skin turns bright red. This is especially important if you are unable to feel pain, heat, or cold. You have a greater risk of getting burned. Activity  Do not stay in bed. Staying in bed for more than 1-2 days can delay your recovery. Sit up and stand up straight. Avoid leaning forward when you sit or  hunching over when you stand. If you work at a desk, sit close to it so you do not need to lean over. Keep your chin tucked in. Keep your neck drawn back, and keep your elbows bent at a 90-degree angle (right angle). Sit high and close to the steering wheel when you drive. Add lower back (lumbar) support to your car seat, if needed. Take short walks on even surfaces as soon as you are able. Try to increase the length of time you walk each day. Do not sit, drive, or stand in one place for more than 30 minutes at a time. Sitting or standing for long periods of time can put stress on your back. Do not drive or use heavy machinery while taking prescription pain medicine. Use proper lifting techniques. When you bend and lift, use positions that put less stress on your back: Bend your knees. Keep the load close to your body. Avoid twisting. Exercise regularly as told by your health care provider. Exercising helps your back heal faster and helps prevent back injuries by keeping muscles strong and flexible. Work with a physical therapist to make a safe exercise program, as recommended by your health care provider. Do any exercises as told by your physical therapist.  Lifestyle Maintain a healthy weight. Extra weight puts stress on your back and makes it difficult to have good posture. Avoid activities or situations that make you feel anxious or stressed. Stress and anxiety increase muscle tension and can make back pain worse. Learn ways to manage   anxiety and stress, such as through exercise. General instructions Sleep on a firm mattress in a comfortable position. Try lying on your side with your knees slightly bent. If you lie on your back, put a pillow under your knees. Follow your treatment plan as told by your health care provider. This may include: Cognitive or behavioral therapy. Acupuncture or massage therapy. Meditation or yoga. Contact a health care provider if: You have pain that is not  relieved with rest or medicine. You have increasing pain going down into your legs or buttocks. Your pain does not improve after 2 weeks. You have pain at night. You lose weight without trying. You have a fever or chills. Get help right away if: You develop new bowel or bladder control problems. You have unusual weakness or numbness in your arms or legs. You develop nausea or vomiting. You develop abdominal pain. You feel faint. Summary Acute back pain is sudden and usually short-lived. Use proper lifting techniques. When you bend and lift, use positions that put less stress on your back. Take over-the-counter and prescription medicines and apply heat or ice as directed by your health care provider. This information is not intended to replace advice given to you by your health care provider. Make sure you discuss any questions you have with your healthcare provider. Document Revised: 04/13/2020 Document Reviewed: 04/16/2020 Elsevier Patient Education  2022 [employer]  

## 2021-05-10 ENCOUNTER — Encounter: Payer: Self-pay | Admitting: Internal Medicine

## 2021-05-10 ENCOUNTER — Other Ambulatory Visit: Payer: Self-pay

## 2021-05-10 ENCOUNTER — Ambulatory Visit: Payer: Medicare Other | Admitting: Internal Medicine

## 2021-05-10 DIAGNOSIS — K219 Gastro-esophageal reflux disease without esophagitis: Secondary | ICD-10-CM | POA: Diagnosis not present

## 2021-05-10 DIAGNOSIS — I10 Essential (primary) hypertension: Secondary | ICD-10-CM | POA: Diagnosis not present

## 2021-05-10 DIAGNOSIS — L309 Dermatitis, unspecified: Secondary | ICD-10-CM

## 2021-05-10 DIAGNOSIS — R7309 Other abnormal glucose: Secondary | ICD-10-CM

## 2021-05-10 DIAGNOSIS — R972 Elevated prostate specific antigen [PSA]: Secondary | ICD-10-CM | POA: Diagnosis not present

## 2021-05-10 MED ORDER — TRIAMCINOLONE ACETONIDE 0.1 % EX CREA: TOPICAL_CREAM | CUTANEOUS | 3 refills | Status: DC

## 2021-05-10 NOTE — Assessment & Plan Note (Signed)
Kenalog in Eucerin topically

## 2021-05-10 NOTE — Assessment & Plan Note (Signed)
Check A1c. 

## 2021-05-10 NOTE — Assessment & Plan Note (Signed)
Monitoring PSA 

## 2021-05-10 NOTE — Assessment & Plan Note (Signed)
Nexium qd  Potential benefits of a long term PPI use as well as potential risks  and complications were explained to the patient and were aknowledged.

## 2021-05-10 NOTE — Assessment & Plan Note (Signed)
Cont w/Hytrin, Detrol LA

## 2021-05-10 NOTE — Progress Notes (Signed)
Subjective:  Patient ID: Brian Mora, male    DOB: Dec 17, 1940  Age: 80 y.o. MRN: [id number]  CC: Follow-up (3 MONTH F/U) and Medication Refill (Want refill on Triamcinolone cream)   HPI Brian Mora presents for rash, OA, GERD  Outpatient Medications Prior to Visit  Medication Sig Dispense Refill   acetaminophen (TYLENOL) 325 MG tablet Take 2 tablets (650 mg total) by mouth every 6 (six) hours as needed for mild pain (or Fever >/= 101).     aspirin EC 325 MG EC tablet 1 tab a day for the next 30 days to prevent blood clots 30 tablet 0   Cholecalciferol (VITAMIN D3) 2000 units capsule Take 1 capsule (2,000 Units total) by mouth daily. 100 capsule 3   docusate sodium (COLACE) 100 MG capsule Take 1 capsule (100 mg total) by mouth 2 (two) times daily. 10 capsule 0   esomeprazole (NEXIUM) 40 MG capsule TAKE 1 CAPSULE BY MOUTH EVERY DAY 90 capsule 3   loratadine (CLARITIN) 10 MG tablet Take 10 mg by mouth daily as needed for allergies.     meloxicam (MOBIC) 7.5 MG tablet TAKE 1 TABLET 1-2 TIMES DAILY AS NEEDED FOR ARTHRITIS 60 tablet 3   OVER THE COUNTER MEDICATION 1 tablet. OTC antacid from [facility]     polyethylene glycol (MIRALAX / GLYCOLAX) packet Take 17 g by mouth 2 (two) times daily. 60 each 0   Simethicone (GAS-X PO) Take 1 tablet by mouth as needed.     terazosin (HYTRIN) 1 MG capsule TAKE 1 CAPSULE BY MOUTH EVERYDAY AT BEDTIME 90 capsule 3   tolterodine (DETROL LA) 4 MG 24 hr capsule Take 1 capsule (4 mg total) by mouth daily. 90 capsule 3   triamcinolone cream (KENALOG) 0.1 % APPLY  CREAM EXTERNALLY TWICE DAILY 450 g 3   methylPREDNISolone (MEDROL DOSEPAK) 4 MG TBPK tablet TAKE AS DIRECTED (Patient not taking: Reported on 05/10/2021) 21 each 0   No facility-administered medications prior to visit.    ROS: Review of Systems  Constitutional:  Negative for appetite change, fatigue and unexpected weight change.  HENT:  Negative for congestion, nosebleeds, sneezing, sore  throat and trouble swallowing.   Eyes:  Negative for itching and visual disturbance.  Respiratory:  Negative for cough.   Cardiovascular:  Negative for chest pain, palpitations and leg swelling.  Gastrointestinal:  Negative for abdominal distention, blood in stool, diarrhea and nausea.  Genitourinary:  Negative for frequency and hematuria.  Musculoskeletal:  Positive for arthralgias. Negative for back pain, gait problem, joint swelling and neck pain.  Skin:  Positive for rash.  Neurological:  Negative for dizziness, tremors, speech difficulty and weakness.  Psychiatric/Behavioral:  Positive for hallucinations. Negative for agitation, dysphoric mood and sleep disturbance. The patient is hyperactive. The patient is not nervous/anxious.    Objective:  BP 132/78 (BP Location: Left Arm)   Pulse 85   Temp 98.6 F (37 C) (Oral)   Ht 6\' 2"  (1.88 m)   Wt 206 lb (93.4 kg)   SpO2 96%   BMI 26.45 kg/m   BP Readings from Last 3 Encounters:  05/10/21 132/78  02/03/21 (!) 104/52  11/02/20 (!) 162/82    Wt Readings from Last 3 Encounters:  05/10/21 206 lb (93.4 kg)  02/03/21 216 lb 3.2 oz (98.1 kg)  11/02/20 219 lb 3.2 oz (99.4 kg)    Physical Exam Constitutional:      General: He is not in acute distress.    Appearance: He  is well-developed. He is not diaphoretic.  HENT:     Head: Normocephalic and atraumatic.     Right Ear: External ear normal.     Left Ear: External ear normal.     Nose: Nose normal.     Mouth/Throat:     Pharynx: No oropharyngeal exudate.  Eyes:     General: No scleral icterus.       Right eye: No discharge.        Left eye: No discharge.     Conjunctiva/sclera: Conjunctivae normal.     Pupils: Pupils are equal, round, and reactive to light.  Neck:     Thyroid: No thyromegaly.     Vascular: No JVD.     Trachea: No tracheal deviation.  Cardiovascular:     Rate and Rhythm: Normal rate and regular rhythm.     Heart sounds: Normal heart sounds. No murmur  heard.   No friction rub. No gallop.  Pulmonary:     Effort: Pulmonary effort is normal. No respiratory distress.     Breath sounds: Normal breath sounds. No stridor. No wheezing or rales.  Chest:     Chest wall: No tenderness.  Abdominal:     General: Bowel sounds are normal. There is no distension.     Palpations: Abdomen is soft. There is no mass.     Tenderness: There is no abdominal tenderness. There is no guarding or rebound.  Genitourinary:    Penis: Normal. No tenderness.      Prostate: Normal.     Rectum: Normal. Guaiac result negative.  Musculoskeletal:        General: Tenderness present. Normal range of motion.     Cervical back: Normal range of motion and neck supple.  Lymphadenopathy:     Cervical: No cervical adenopathy.  Skin:    General: Skin is warm and dry.     Coloration: Skin is not pale.     Findings: Rash present. No erythema.  Neurological:     Mental Status: He is alert and oriented to person, place, and time.     Cranial Nerves: No cranial nerve deficit.     Motor: No abnormal muscle tone.     Coordination: Coordination normal.     Deep Tendon Reflexes: Reflexes are normal and symmetric. Reflexes normal.  Psychiatric:        Behavior: Behavior normal.        Thought Content: Thought content normal.        Judgment: Judgment normal.    Lab Results  Component Value Date   WBC 4.5 08/27/2020   HGB 13.3 08/27/2020   HCT 39.8 08/27/2020   PLT 201.0 08/27/2020   GLUCOSE 140 (H) 02/03/2021   CHOL 257 (H) 08/27/2020   TRIG 202.0 (H) 08/27/2020   HDL 52.40 08/27/2020   LDLDIRECT 164.0 08/27/2020   LDLCALC 144 (H) 08/28/2019   ALT 22 02/03/2021   AST 24 02/03/2021   NA 141 02/03/2021   K 3.9 02/03/2021   CL 103 02/03/2021   CREATININE 1.08 02/03/2021   BUN 16 02/03/2021   CO2 28 02/03/2021   TSH 1.15 08/27/2020   PSA 28.34 (H) 02/03/2021   INR 1.02 10/29/2014   HGBA1C 7.2 (H) 08/27/2020   MICROALBUR 0.7 08/27/2020    US Abdomen  Complete  Result Date: 03/13/2016 CLINICAL DATA:  Abdominal pain . EXAM: ABDOMEN ULTRASOUND COMPLETE COMPARISON:  None. FINDINGS: Gallbladder: No gallstones or wall thickening visualized. No sonographic Murphy sign noted by sonographer. Common  bile duct: Diameter: 5.7 mm Liver: There is echogenic consistent fatty infiltration and/or hepatocellular disease. No focal hepatic abnormality identified. IVC: No abnormality visualized. Pancreas: Visualized portion unremarkable. Spleen: Size and appearance within normal limits. Right Kidney: Length: 12.2 cm. Echogenicity within normal limits. No mass or hydronephrosis visualized. Left Kidney: Length: 11.9 cm. Echogenicity within normal limits. No mass or hydronephrosis visualized. 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 Other findings: None. IMPRESSION: 1. Liver is echogenic consistent fatty infiltration and/or hepatocellular disease . No acute abnormality. 2. 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 Recommend followup by ultrasound in 5 years. This recommendation follows ACR consensus guidelines: White Paper of the ACR Incidental Findings Committee II on Vascular Findings. J Am Coll Radiol 2013; 10:789-794. Electronically Signed   By: Marcello Moores  Register   On: 03/13/2016 12:29    Assessment & Plan:   Problem List Items Addressed This Visit     Eczema    Kenalog in Eucerin topically      Relevant Orders   TSH   Urinalysis   CBC with Differential/Platelet   Lipid panel   Comprehensive metabolic panel   PSA   Elevated glucose    Check A1c      Relevant Orders   Urinalysis   Comprehensive metabolic panel   Hemoglobin A1c   Elevated PSA    Monitoring PSA      Relevant Orders   PSA   GERD (gastroesophageal reflux disease)    Nexium qd  Potential benefits of a long term PPI use as well as potential risks  and complications were explained to the patient and were aknowledged.       HTN (hypertension)    Cont w/Hytrin, Detrol LA      Relevant Orders   TSH   Urinalysis   CBC with Differential/Platelet   Lipid panel   Comprehensive metabolic panel   PSA      Follow-up: Return in about 3 months (around 08/10/2021) for Wellness Exam.  Walker Kehr, MD

## 2021-06-10 ENCOUNTER — Other Ambulatory Visit: Payer: Self-pay

## 2021-06-10 ENCOUNTER — Ambulatory Visit: Payer: Medicare Other | Admitting: Podiatry

## 2021-06-10 ENCOUNTER — Encounter: Payer: Self-pay | Admitting: Podiatry

## 2021-06-10 DIAGNOSIS — M79675 Pain in left toe(s): Secondary | ICD-10-CM

## 2021-06-10 DIAGNOSIS — B351 Tinea unguium: Secondary | ICD-10-CM | POA: Diagnosis not present

## 2021-06-10 DIAGNOSIS — M79674 Pain in right toe(s): Secondary | ICD-10-CM

## 2021-06-10 NOTE — Progress Notes (Signed)
This patient presents  to the office for evaluation and treatment of long thick painful nails .  This patient is unable to trim his own nails since the patient cannot reach his feet.  Patient says the nails are painful walking and wearing his shoes. He also says he experiences pain under both big toes.  He presents  for preventive foot care services.  General Appearance  Alert, conversant and in no acute stress.  Vascular  Dorsalis pedis and posterior tibial  pulses are palpable  bilaterally.  Capillary return is within normal limits  bilaterally. Temperature is within normal limits  bilaterally.  Neurologic  Senn-Weinstein monofilament wire test diminished  bilaterally. Muscle power within normal limits bilaterally.  Nails Thick disfigured discolored nails with subungual debris  from hallux to fifth toes bilaterally. No evidence of bacterial infection or drainage bilaterally.  Orthopedic  No limitations of motion  feet .  No crepitus or effusions noted.  No bony pathology or digital deformities noted. Dorsiflexed distal phalanx  B/L with bony prominence from proximal phalanx plantarly. HAV  left severe.  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  Skin  normotropic skin with no porokeratosis noted bilaterally.  No signs of infections or ulcers noted.     Onychomycosis  Pain in toes right foot  Pain in toes left foot  Debridement  of nails  1-5  B/L with a nail nipper.  Nails were then filed using a dremel tool with no incidents.  Discussed his big toe pain with patient and dispensed toe cap for hallux.  RTC  3 months    Gardiner Barefoot DPM

## 2021-08-11 ENCOUNTER — Other Ambulatory Visit: Payer: Self-pay

## 2021-08-11 ENCOUNTER — Ambulatory Visit: Payer: Medicare Other | Admitting: Internal Medicine

## 2021-08-11 ENCOUNTER — Encounter: Payer: Self-pay | Admitting: Internal Medicine

## 2021-08-11 DIAGNOSIS — R972 Elevated prostate specific antigen [PSA]: Secondary | ICD-10-CM

## 2021-08-11 DIAGNOSIS — L309 Dermatitis, unspecified: Secondary | ICD-10-CM

## 2021-08-11 DIAGNOSIS — R7309 Other abnormal glucose: Secondary | ICD-10-CM | POA: Diagnosis not present

## 2021-08-11 DIAGNOSIS — K219 Gastro-esophageal reflux disease without esophagitis: Secondary | ICD-10-CM

## 2021-08-11 DIAGNOSIS — I10 Essential (primary) hypertension: Secondary | ICD-10-CM | POA: Diagnosis not present

## 2021-08-11 LAB — COMPREHENSIVE METABOLIC PANEL
ALT: 22 U/L (ref 0–53)
AST: 20 U/L (ref 0–37)
Albumin: 4.2 g/dL (ref 3.5–5.2)
Alkaline Phosphatase: 81 U/L (ref 39–117)
BUN: 11 mg/dL (ref 6–23)
CO2: 28 mEq/L (ref 19–32)
Calcium: 9.4 mg/dL (ref 8.4–10.5)
Chloride: 102 mEq/L (ref 96–112)
Creatinine, Ser: 1.03 mg/dL (ref 0.40–1.50)
GFR: 68.8 mL/min (ref >60.00)
Glucose, Bld: 100 mg/dL — ABNORMAL HIGH (ref 70–99)
Potassium: 4 mEq/L (ref 3.5–5.1)
Sodium: 139 mEq/L (ref 135–145)
Total Bilirubin: 0.5 mg/dL (ref 0.2–1.2)
Total Protein: 7.1 g/dL (ref 6.0–8.3)

## 2021-08-11 LAB — CBC WITH DIFFERENTIAL/PLATELET
Basophils Absolute: 0 10*3/uL (ref 0.0–0.1)
Basophils Relative: 0.5 % (ref 0.0–3.0)
Eosinophils Absolute: 0.1 10*3/uL (ref 0.0–0.7)
Eosinophils Relative: 2.6 % (ref 0.0–5.0)
HCT: 40.4 % (ref 39.0–52.0)
Hemoglobin: 13.1 g/dL (ref 13.0–17.0)
Lymphocytes Relative: 26.1 % (ref 12.0–46.0)
Lymphs Abs: 1.1 10*3/uL (ref 0.7–4.0)
MCHC: 32.3 g/dL (ref 30.0–36.0)
MCV: 87.5 fl (ref 78.0–100.0)
Monocytes Absolute: 0.5 10*3/uL (ref 0.1–1.0)
Monocytes Relative: 11.5 % (ref 3.0–12.0)
Neutro Abs: 2.5 10*3/uL (ref 1.4–7.7)
Neutrophils Relative %: 59.3 % (ref 43.0–77.0)
Platelets: 191 10*3/uL (ref 150.0–400.0)
RBC: 4.62 Mil/uL (ref 4.22–5.81)
RDW: 14.1 % (ref 11.5–15.5)
WBC: 4.1 10*3/uL (ref 4.0–10.5)

## 2021-08-11 LAB — URINALYSIS
Bilirubin Urine: NEGATIVE
Hgb urine dipstick: NEGATIVE
Ketones, ur: NEGATIVE
Leukocytes,Ua: NEGATIVE
Nitrite: NEGATIVE
Specific Gravity, Urine: 1.015 (ref 1.000–1.030)
Total Protein, Urine: NEGATIVE
Urine Glucose: NEGATIVE
Urobilinogen, UA: 0.2 (ref 0.0–1.0)
pH: 7.5 (ref 5.0–8.0)

## 2021-08-11 LAB — LIPID PANEL
Cholesterol: 230 mg/dL — ABNORMAL HIGH (ref 0–200)
HDL: 56.8 mg/dL (ref >39.00)
LDL Cholesterol: 144 mg/dL — ABNORMAL HIGH (ref 0–99)
NonHDL: 172.97
Total CHOL/HDL Ratio: 4
Triglycerides: 145 mg/dL (ref 0.0–149.0)
VLDL: 29 mg/dL (ref 0.0–40.0)

## 2021-08-11 LAB — PSA: PSA: 36.01 ng/mL — ABNORMAL HIGH (ref 0.10–4.00)

## 2021-08-11 LAB — HEMOGLOBIN A1C: Hgb A1c MFr Bld: 7.3 % — ABNORMAL HIGH (ref 4.6–6.5)

## 2021-08-11 LAB — TSH: TSH: 0.84 u[IU]/mL (ref 0.35–5.50)

## 2021-08-11 MED ORDER — TRIAMCINOLONE ACETONIDE 0.1 % EX CREA: TOPICAL_CREAM | CUTANEOUS | 3 refills | Status: DC

## 2021-08-11 NOTE — Assessment & Plan Note (Signed)
Check A1c. 

## 2021-08-11 NOTE — Assessment & Plan Note (Signed)
Cont w/Nexium qd

## 2021-08-11 NOTE — Assessment & Plan Note (Signed)
Cont w/Kenalog in Eucerin topically

## 2021-08-11 NOTE — Assessment & Plan Note (Signed)
Cont w/Hytrin, Detrol LA

## 2021-08-11 NOTE — Assessment & Plan Note (Signed)
Monitoring PSA 

## 2021-08-11 NOTE — Progress Notes (Signed)
Subjective:  Patient ID: Brian Mora, male    DOB: 1940-08-08  Age: 81 y.o. MRN: [id number]  CC: Follow-up (3 month f/u- Pt requesting refill on triamcinolone cream)   HPI Brian Mora presents for his rash, GERD, BPH, HTN  Outpatient Medications Prior to Visit  Medication Sig Dispense Refill   acetaminophen (TYLENOL) 325 MG tablet Take 2 tablets (650 mg total) by mouth every 6 (six) hours as needed for mild pain (or Fever >/= 101).     aspirin EC 325 MG EC tablet 1 tab a day for the next 30 days to prevent blood clots 30 tablet 0   Cholecalciferol (VITAMIN D3) 2000 units capsule Take 1 capsule (2,000 Units total) by mouth daily. 100 capsule 3   docusate sodium (COLACE) 100 MG capsule Take 1 capsule (100 mg total) by mouth 2 (two) times daily. 10 capsule 0   esomeprazole (NEXIUM) 40 MG capsule TAKE 1 CAPSULE BY MOUTH EVERY DAY 90 capsule 3   loratadine (CLARITIN) 10 MG tablet Take 10 mg by mouth daily as needed for allergies.     meloxicam (MOBIC) 7.5 MG tablet TAKE 1 TABLET 1-2 TIMES DAILY AS NEEDED FOR ARTHRITIS 60 tablet 3   OVER THE COUNTER MEDICATION 1 tablet. OTC antacid from [facility]     polyethylene glycol (MIRALAX / GLYCOLAX) packet Take 17 g by mouth 2 (two) times daily. 60 each 0   Simethicone (GAS-X PO) Take 1 tablet by mouth as needed.     terazosin (HYTRIN) 1 MG capsule TAKE 1 CAPSULE BY MOUTH EVERYDAY AT BEDTIME 90 capsule 3   tolterodine (DETROL LA) 4 MG 24 hr capsule Take 1 capsule (4 mg total) by mouth daily. 90 capsule 3   triamcinolone cream (KENALOG) 0.1 % APPLY  CREAM EXTERNALLY TWICE DAILY 450 g 3   No facility-administered medications prior to visit.    ROS: Review of Systems  Constitutional:  Negative for appetite change, fatigue and unexpected weight change.  HENT:  Negative for congestion, nosebleeds, sneezing, sore throat and trouble swallowing.   Eyes:  Negative for itching and visual disturbance.  Respiratory:  Negative for cough.    Cardiovascular:  Negative for chest pain, palpitations and leg swelling.  Gastrointestinal:  Negative for abdominal distention, blood in stool, diarrhea and nausea.  Genitourinary:  Negative for frequency and hematuria.  Musculoskeletal:  Negative for back pain, gait problem, joint swelling and neck pain.  Skin:  Positive for rash.  Neurological:  Negative for dizziness, tremors, speech difficulty and weakness.  Psychiatric/Behavioral:  Negative for agitation, decreased concentration, dysphoric mood and sleep disturbance. The patient is not nervous/anxious.    Objective:  BP 110/60 (BP Location: Left Arm)    Pulse 86    Temp 98.5 F (36.9 C) (Oral)    Ht 6\' 2"  (1.88 m)    Wt 204 lb (92.5 kg)    SpO2 96%    BMI 26.19 kg/m   BP Readings from Last 3 Encounters:  08/11/21 110/60  06/10/21 (!) 151/93  05/10/21 132/78    Wt Readings from Last 3 Encounters:  08/11/21 204 lb (92.5 kg)  05/10/21 206 lb (93.4 kg)  02/03/21 216 lb 3.2 oz (98.1 kg)    Physical Exam Constitutional:      General: He is not in acute distress.    Appearance: He is well-developed.     Comments: NAD  Eyes:     Conjunctiva/sclera: Conjunctivae normal.     Pupils: Pupils are equal, round,  and reactive to light.  Neck:     Thyroid: No thyromegaly.     Vascular: No JVD.  Cardiovascular:     Rate and Rhythm: Normal rate and regular rhythm.     Heart sounds: Normal heart sounds. No murmur heard.   No friction rub. No gallop.  Pulmonary:     Effort: Pulmonary effort is normal. No respiratory distress.     Breath sounds: Normal breath sounds. No wheezing or rales.  Chest:     Chest wall: No tenderness.  Abdominal:     General: Bowel sounds are normal. There is no distension.     Palpations: Abdomen is soft. There is no mass.     Tenderness: There is no abdominal tenderness. There is no guarding or rebound.  Musculoskeletal:        General: No tenderness. Normal range of motion.     Cervical back: Normal  range of motion.  Lymphadenopathy:     Cervical: No cervical adenopathy.  Skin:    General: Skin is warm and dry.     Findings: No rash.  Neurological:     Mental Status: He is alert and oriented to person, place, and time.     Cranial Nerves: No cranial nerve deficit.     Motor: No abnormal muscle tone.     Coordination: Coordination normal.     Gait: Gait normal.     Deep Tendon Reflexes: Reflexes are normal and symmetric.  Psychiatric:        Behavior: Behavior normal.        Thought Content: Thought content normal.        Judgment: Judgment normal.   Dry skin  Lab Results  Component Value Date   WBC 4.5 08/27/2020   HGB 13.3 08/27/2020   HCT 39.8 08/27/2020   PLT 201.0 08/27/2020   GLUCOSE 140 (H) 02/03/2021   CHOL 257 (H) 08/27/2020   TRIG 202.0 (H) 08/27/2020   HDL 52.40 08/27/2020   LDLDIRECT 164.0 08/27/2020   LDLCALC 144 (H) 08/28/2019   ALT 22 02/03/2021   AST 24 02/03/2021   NA 141 02/03/2021   K 3.9 02/03/2021   CL 103 02/03/2021   CREATININE 1.08 02/03/2021   BUN 16 02/03/2021   CO2 28 02/03/2021   TSH 1.15 08/27/2020   PSA 28.34 (H) 02/03/2021   INR 1.02 10/29/2014   HGBA1C 7.2 (H) 08/27/2020   MICROALBUR 0.7 08/27/2020    US Abdomen Complete  Result Date: 03/13/2016 CLINICAL DATA:  Abdominal pain . EXAM: ABDOMEN ULTRASOUND COMPLETE COMPARISON:  None. FINDINGS: Gallbladder: No gallstones or wall thickening visualized. No sonographic Murphy sign noted by sonographer. Common bile duct: Diameter: 5.7 mm Liver: There is echogenic consistent fatty infiltration and/or hepatocellular disease. No focal hepatic abnormality identified. IVC: No abnormality visualized. Pancreas: Visualized portion unremarkable. Spleen: Size and appearance within normal limits. Right Kidney: Length: 12.2 cm. Echogenicity within normal limits. No mass or hydronephrosis visualized. Left Kidney: Length: 11.9 cm. Echogenicity within normal limits. No mass or hydronephrosis visualized.  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 Other findings: None. IMPRESSION: 1. Liver is echogenic consistent fatty infiltration and/or hepatocellular disease . No acute abnormality. 2. 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 Recommend followup by ultrasound in 5 years. This recommendation follows ACR consensus guidelines: White Paper of the ACR Incidental Findings Committee II on Vascular Findings. J Am Coll Radiol 2013; 10:789-794. Electronically Signed   By: Marcello Moores  Register  On: 03/13/2016 12:29    Assessment & Plan:   Problem List Items Addressed This Visit     Eczema    Cont w/Kenalog in Eucerin topically      Elevated glucose    Check A1c      Elevated PSA    Monitoring PSA      GERD (gastroesophageal reflux disease)    Cont w/Nexium qd      HTN (hypertension)    Cont w/Hytrin, Detrol LA         Meds ordered this encounter  Medications   DISCONTD: triamcinolone cream (KENALOG) 0.1 %    Sig: APPLY  CREAM EXTERNALLY TWICE DAILY    Dispense:  450 g    Refill:  3    In a jar please   triamcinolone cream (KENALOG) 0.1 %    Sig: APPLY  CREAM EXTERNALLY TWICE DAILY    Dispense:  450 g    Refill:  3    In a jar please      Follow-up: Return in about 4 months (around 12/09/2021) for a follow-up visit.  Walker Kehr, MD

## 2021-09-23 ENCOUNTER — Other Ambulatory Visit: Payer: Self-pay

## 2021-09-23 ENCOUNTER — Ambulatory Visit: Payer: Medicare Other | Admitting: Podiatry

## 2021-09-23 ENCOUNTER — Encounter: Payer: Self-pay | Admitting: Podiatry

## 2021-09-23 DIAGNOSIS — M79674 Pain in right toe(s): Secondary | ICD-10-CM | POA: Diagnosis not present

## 2021-09-23 DIAGNOSIS — B351 Tinea unguium: Secondary | ICD-10-CM

## 2021-09-23 DIAGNOSIS — M79675 Pain in left toe(s): Secondary | ICD-10-CM

## 2021-09-23 NOTE — Progress Notes (Signed)
This patient presents  to the office for evaluation and treatment of long thick painful nails .  This patient is unable to trim his own nails since the patient cannot reach his feet.  Patient says the nails are painful walking and wearing his shoes. He also says he experiences pain under both big toes.  He presents  for preventive foot care services.  General Appearance  Alert, conversant and in no acute stress.  Vascular  Dorsalis pedis and posterior tibial  pulses are palpable  bilaterally.  Capillary return is within normal limits  bilaterally. Temperature is within normal limits  bilaterally.  Neurologic  Senn-Weinstein monofilament wire test diminished  bilaterally. Muscle power within normal limits bilaterally.  Nails Thick disfigured discolored nails with subungual debris  from hallux to fifth toes bilaterally. No evidence of bacterial infection or drainage bilaterally.  Orthopedic  No limitations of motion  feet .  No crepitus or effusions noted.  No bony pathology or digital deformities noted. Dorsiflexed distal phalanx  B/L with bony prominence from proximal phalanx plantarly. HAV  left severe.  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  Skin  normotropic skin with no porokeratosis noted bilaterally.  No signs of infections or ulcers noted.     Onychomycosis  Pain in toes right foot  Pain in toes left foot  Debridement  of nails  1-5  B/L with a nail nipper.  Nails were then filed using a dremel tool with no incidents.  Discussed his big toe pain with patient and dispensed toe cap for hallux. Patient wanted to see surgical podiatrist.  for bunion. RTC  3 months    Gardiner Barefoot DPM

## 2021-10-10 ENCOUNTER — Ambulatory Visit: Payer: Medicare Other | Admitting: Podiatry

## 2021-10-11 ENCOUNTER — Other Ambulatory Visit: Payer: Self-pay

## 2021-10-11 ENCOUNTER — Encounter: Payer: Self-pay | Admitting: Internal Medicine

## 2021-10-11 ENCOUNTER — Ambulatory Visit: Payer: Medicare Other | Admitting: Internal Medicine

## 2021-10-11 DIAGNOSIS — I10 Essential (primary) hypertension: Secondary | ICD-10-CM

## 2021-10-11 DIAGNOSIS — R1032 Left lower quadrant pain: Secondary | ICD-10-CM

## 2021-10-11 DIAGNOSIS — K219 Gastro-esophageal reflux disease without esophagitis: Secondary | ICD-10-CM | POA: Diagnosis not present

## 2021-10-11 DIAGNOSIS — K4090 Unilateral inguinal hernia, without obstruction or gangrene, not specified as recurrent: Secondary | ICD-10-CM | POA: Diagnosis not present

## 2021-10-11 NOTE — Assessment & Plan Note (Signed)
Due to medium size L inguinal hernia w/some discomfort ?Discussed options ?Will ref to Surgery ?

## 2021-10-11 NOTE — Assessment & Plan Note (Signed)
Cont w/Hytrin 

## 2021-10-11 NOTE — Assessment & Plan Note (Signed)
Medium size L inguinal hernia w/some discomfort ?Discussed options ?Will ref to Surgery ?

## 2021-10-11 NOTE — Patient Instructions (Signed)
Inguinal Hernia, Adult An inguinal hernia develops when fat or the intestines push through a weak spot in a muscle where the leg meets the lower abdomen (groin). This creates a bulge. This kind of hernia could also be: In the scrotum, if you are male. In folds of skin around the vagina, if you are male. There are three types of inguinal hernias: Hernias that can be pushed back into the abdomen (are reducible). This type rarely causes pain. Hernias that are not reducible (are incarcerated). Hernias that are not reducible and lose their blood supply (are strangulated). This type of hernia requires emergency surgery. What are the causes? This condition is caused by having a weak spot in the muscles or tissues in your groin. This develops over time. The hernia may poke through the weak spot when you suddenly strain your lower abdominal muscles, such as when you: Lift a heavy object. Strain to have a bowel movement. Constipation can lead to straining. Cough. What increases the risk? This condition is more likely to develop in: Males. Pregnant females. People who: Are overweight. Work in jobs that require long periods of standing or heavy lifting. Have had an inguinal hernia before. Smoke or have lung disease. These factors can lead to long-term (chronic) coughing. What are the signs or symptoms? Symptoms may depend on the size of the hernia. Often, a small inguinal hernia has no symptoms. Symptoms of a larger hernia may include: A bulge in the groin area. This is easier to see when standing. It might not be visible when lying down. Pain or burning in the groin. This may get worse when lifting, straining, or coughing. A dull ache or a feeling of pressure in the groin. An unusual bulge in the scrotum, in males. Symptoms of a strangulated inguinal hernia may include: A bulge in your groin that is very painful and tender to the touch. A bulge that turns red or purple. Fever, nausea, and  vomiting. Inability to have a bowel movement or to pass gas. How is this diagnosed? This condition is diagnosed based on your symptoms, your medical history, and a physical exam. Your health care provider may feel your groin area and ask you to cough. How is this treated? Treatment depends on the size of your hernia and whether you have symptoms. If you do not have symptoms, your health care provider may have you watch your hernia carefully and have you come in for follow-up visits. If your hernia is large or if you have symptoms, you may need surgery to repair the hernia. Follow these instructions at home: Lifestyle Avoid lifting heavy objects. Avoid standing for long periods of time. Do not use any products that contain nicotine or tobacco. These products include cigarettes, chewing tobacco, and vaping devices, such as e-cigarettes. If you need help quitting, ask your health care provider. Maintain a healthy weight. Preventing constipation You may need to take these actions to prevent or treat constipation: Drink enough fluid to keep your urine pale yellow. Take over-the-counter or prescription medicines. Eat foods that are high in fiber, such as beans, whole grains, and fresh fruits and vegetables. Limit foods that are high in fat and processed sugars, such as fried or sweet foods. General instructions You may try to push the hernia back in place by very gently pressing on it while lying down. Do not try to force the bulge back in if it will not push in easily. Watch your hernia for any changes in shape, size, or   color. Get help right away if you notice any changes. ?Take over-the-counter and prescription medicines only as told by your health care provider. ?Keep all follow-up visits. This is important. ?Contact a health care provider if: ?You have a fever or chills. ?You develop new symptoms. ?Your symptoms get worse. ?Get help right away if: ?You have pain in your groin that suddenly gets  worse. ?You have a bulge in your groin that: ?Suddenly gets bigger and does not get smaller. ?Becomes red or purple or painful to the touch. ?You are a man and you have a sudden pain in your scrotum, or the size of your scrotum suddenly changes. ?You cannot push the hernia back in place by very gently pressing on it when you are lying down. ?You have nausea or vomiting that does not go away. ?You have a fast heartbeat. ?You cannot have a bowel movement or pass gas. ?These symptoms may represent a serious problem that is an emergency. Do not wait to see if the symptoms will go away. Get medical help right away. Call your local emergency services (911 in the [place] ?Summary ?An inguinal hernia develops when fat or the intestines push through a weak spot in a muscle where your leg meets your lower abdomen (groin). ?This condition is caused by having a weak spot in muscles or tissues in your groin. ?Symptoms may depend on the size of the hernia, and they may include pain or swelling in your groin. A small inguinal hernia often has no symptoms. ?Treatment may not be needed if you do not have symptoms. If you have symptoms or a large hernia, you may need surgery to repair the hernia. ?Avoid lifting heavy objects. Also, avoid standing for long periods of time. ?This information is not intended to replace advice given to you by your health care provider. Make sure you discuss any questions you have with your health care provider. ?Document Revised: 03/23/2020 Document Reviewed: 03/23/2020 ?Elsevier Patient Education ? [place]. ? ?

## 2021-10-11 NOTE — Assessment & Plan Note (Signed)
Cont w/Nexium ?

## 2021-10-11 NOTE — Progress Notes (Signed)
? ?Subjective:  ?Patient ID: Brian Mora, male    DOB: 10-Jul-1941  Age: 81 y.o. MRN: [id number] ? ?CC: No chief complaint on file. ? ? ?HPI ?Brian Mora presents for LLQ abd pain since last week w/some nausea. No chills.Marland KitchenMarland KitchenPer pt: he lost wt on diet... ?F/u BPH, GERD, HTN ? ?Outpatient Medications Prior to Visit  ?Medication Sig Dispense Refill  ? acetaminophen (TYLENOL) 325 MG tablet Take 2 tablets (650 mg total) by mouth every 6 (six) hours as needed for mild pain (or Fever >/= 101).    ? aspirin EC 325 MG EC tablet 1 tab a day for the next 30 days to prevent blood clots 30 tablet 0  ? Cholecalciferol (VITAMIN D3) 2000 units capsule Take 1 capsule (2,000 Units total) by mouth daily. 100 capsule 3  ? docusate sodium (COLACE) 100 MG capsule Take 1 capsule (100 mg total) by mouth 2 (two) times daily. 10 capsule 0  ? esomeprazole (NEXIUM) 40 MG capsule TAKE 1 CAPSULE BY MOUTH EVERY DAY 90 capsule 3  ? loratadine (CLARITIN) 10 MG tablet Take 10 mg by mouth daily as needed for allergies.    ? meloxicam (MOBIC) 7.5 MG tablet TAKE 1 TABLET 1-2 TIMES DAILY AS NEEDED FOR ARTHRITIS 60 tablet 3  ? OVER THE COUNTER MEDICATION 1 tablet. OTC antacid from [facility]    ? polyethylene glycol (MIRALAX / GLYCOLAX) packet Take 17 g by mouth 2 (two) times daily. 60 each 0  ? Simethicone (GAS-X PO) Take 1 tablet by mouth as needed.    ? terazosin (HYTRIN) 1 MG capsule TAKE 1 CAPSULE BY MOUTH EVERYDAY AT BEDTIME 90 capsule 3  ? tolterodine (DETROL LA) 4 MG 24 hr capsule Take 1 capsule (4 mg total) by mouth daily. 90 capsule 3  ? triamcinolone cream (KENALOG) 0.1 % APPLY  CREAM EXTERNALLY TWICE DAILY 450 g 3  ? ?No facility-administered medications prior to visit.  ? ? ?ROS: ?Review of Systems  ?Constitutional:  Positive for chills. Negative for appetite change, fatigue and unexpected weight change.  ?HENT:  Negative for congestion, nosebleeds, sneezing, sore throat and trouble swallowing.   ?Eyes:  Negative for itching and visual  disturbance.  ?Respiratory:  Negative for cough.   ?Cardiovascular:  Negative for chest pain, palpitations and leg swelling.  ?Gastrointestinal:  Positive for abdominal pain, diarrhea and nausea. Negative for abdominal distention, blood in stool, constipation and rectal pain.  ?Genitourinary:  Negative for frequency and hematuria.  ?Musculoskeletal:  Negative for back pain, gait problem, joint swelling and neck pain.  ?Skin:  Negative for rash.  ?Neurological:  Negative for dizziness, tremors, speech difficulty and weakness.  ?Psychiatric/Behavioral:  Negative for agitation, dysphoric mood and sleep disturbance. The patient is not nervous/anxious.   ? ?Objective:  ?BP 140/78 (BP Location: Left Arm, Patient Position: Sitting, Cuff Size: Large)   Pulse 86   Temp 98.3 ?F (36.8 ?C) (Oral)   Ht '6\' 2"'$  (1.88 m)   Wt 194 lb (88 kg)   SpO2 94%   BMI 24.91 kg/m?  ? ?BP Readings from Last 3 Encounters:  ?10/11/21 140/78  ?08/11/21 110/60  ?06/10/21 (!) 151/93  ? ? ?Wt Readings from Last 3 Encounters:  ?10/11/21 194 lb (88 kg)  ?08/11/21 204 lb (92.5 kg)  ?05/10/21 206 lb (93.4 kg)  ? ? ?Physical Exam ?Constitutional:   ?   General: He is not in acute distress. ?   Appearance: Normal appearance. He is well-developed. He is not ill-appearing or  toxic-appearing.  ?   Comments: NAD  ?Eyes:  ?   Conjunctiva/sclera: Conjunctivae normal.  ?   Pupils: Pupils are equal, round, and reactive to light.  ?Neck:  ?   Thyroid: No thyromegaly.  ?   Vascular: No JVD.  ?Cardiovascular:  ?   Rate and Rhythm: Normal rate and regular rhythm.  ?   Heart sounds: Normal heart sounds. No murmur heard. ?  No friction rub. No gallop.  ?Pulmonary:  ?   Effort: Pulmonary effort is normal. No respiratory distress.  ?   Breath sounds: Normal breath sounds. No wheezing or rales.  ?Chest:  ?   Chest wall: No tenderness.  ?Abdominal:  ?   General: Bowel sounds are normal. There is no distension.  ?   Palpations: Abdomen is soft. There is no mass.  ?    Tenderness: There is abdominal tenderness. There is no guarding or rebound.  ?Musculoskeletal:     ?   General: No tenderness. Normal range of motion.  ?   Cervical back: Normal range of motion.  ?Lymphadenopathy:  ?   Cervical: No cervical adenopathy.  ?Skin: ?   General: Skin is warm and dry.  ?   Findings: No rash.  ?Neurological:  ?   Mental Status: He is alert and oriented to person, place, and time.  ?   Cranial Nerves: No cranial nerve deficit.  ?   Motor: No abnormal muscle tone.  ?   Coordination: Coordination normal.  ?   Gait: Gait normal.  ?   Deep Tendon Reflexes: Reflexes are normal and symmetric.  ?Psychiatric:     ?   Behavior: Behavior normal.     ?   Thought Content: Thought content normal.     ?   Judgment: Judgment normal.  ?Medium size L inguinal hernia; LLQ- NT ? ?Lab Results  ?Component Value Date  ? WBC 4.1 08/11/2021  ? HGB 13.1 08/11/2021  ? HCT 40.4 08/11/2021  ? PLT 191.0 08/11/2021  ? GLUCOSE 100 (H) 08/11/2021  ? CHOL 230 (H) 08/11/2021  ? TRIG 145.0 08/11/2021  ? HDL 56.80 08/11/2021  ? LDLDIRECT 164.0 08/27/2020  ? LDLCALC 144 (H) 08/11/2021  ? ALT 22 08/11/2021  ? AST 20 08/11/2021  ? NA 139 08/11/2021  ? K 4.0 08/11/2021  ? CL 102 08/11/2021  ? CREATININE 1.03 08/11/2021  ? BUN 11 08/11/2021  ? CO2 28 08/11/2021  ? TSH 0.84 08/11/2021  ? PSA 36.01 (H) 08/11/2021  ? INR 1.02 10/29/2014  ? HGBA1C 7.3 (H) 08/11/2021  ? MICROALBUR 0.7 08/27/2020  ? ? ?US Abdomen Complete ? ?Result Date: 03/13/2016 ?CLINICAL DATA:  Abdominal pain . EXAM: ABDOMEN ULTRASOUND COMPLETE COMPARISON:  None. FINDINGS: Gallbladder: No gallstones or wall thickening visualized. No sonographic Murphy sign noted by sonographer. Common bile duct: Diameter: 5.7 mm Liver: There is echogenic consistent fatty infiltration and/or hepatocellular disease. No focal hepatic abnormality identified. IVC: No abnormality visualized. Pancreas: Visualized portion unremarkable. Spleen: Size and appearance within normal limits. Right  Kidney: Length: 12.2 cm. Echogenicity within normal limits. No mass or hydronephrosis visualized. Left Kidney: Length: 11.9 cm. Echogenicity within normal limits. No mass or hydronephrosis visualized. 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 Other findings: None. IMPRESSION: 1. Liver is echogenic consistent fatty infiltration and/or hepatocellular disease . No acute abnormality. 2. 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 Recommend followup by ultrasound in 5 years. This recommendation follows ACR consensus  guidelines: White Paper of the ACR Incidental Findings Committee II on Vascular Findings. J Am Coll Radiol 2013; 10:789-794. Electronically Signed   By: Marcello Moores  Register   On: 03/13/2016 12:29  ? ? ?Assessment & Plan:  ? ?Problem List Items Addressed This Visit   ? ? GERD (gastroesophageal reflux disease)  ?  Cont w/Nexium ?  ?  ? HTN (hypertension)  ?  Cont w/Hytrin ?  ?  ? Inguinal hernia  ?  Medium size L inguinal hernia w/some discomfort ?Discussed options ?Will ref to Surgery ?  ?  ? Relevant Orders  ? Ambulatory referral to General Surgery  ? LLQ abdominal pain  ?  Due to medium size L inguinal hernia w/some discomfort ?Discussed options ?Will ref to Surgery ?  ?  ? Relevant Orders  ? Ambulatory referral to General Surgery  ?  ? ? ?No orders of the defined types were placed in this encounter. ?  ? ? ?Follow-up: Return in about 3 months (around 01/11/2022) for a follow-up visit. ? ?Walker Kehr, MD ?

## 2021-12-12 ENCOUNTER — Ambulatory Visit: Payer: Medicare Other | Admitting: Internal Medicine

## 2021-12-12 ENCOUNTER — Encounter: Payer: Self-pay | Admitting: Internal Medicine

## 2021-12-12 VITALS — BP 102/70 | HR 109 | Temp 98.6°F | Ht 74.0 in | Wt 173.0 lb

## 2021-12-12 DIAGNOSIS — R634 Abnormal weight loss: Secondary | ICD-10-CM | POA: Diagnosis not present

## 2021-12-12 DIAGNOSIS — I10 Essential (primary) hypertension: Secondary | ICD-10-CM

## 2021-12-12 DIAGNOSIS — R972 Elevated prostate specific antigen [PSA]: Secondary | ICD-10-CM

## 2021-12-12 DIAGNOSIS — R739 Hyperglycemia, unspecified: Secondary | ICD-10-CM | POA: Diagnosis not present

## 2021-12-12 DIAGNOSIS — R1032 Left lower quadrant pain: Secondary | ICD-10-CM

## 2021-12-12 DIAGNOSIS — K219 Gastro-esophageal reflux disease without esophagitis: Secondary | ICD-10-CM

## 2021-12-12 DIAGNOSIS — K4090 Unilateral inguinal hernia, without obstruction or gangrene, not specified as recurrent: Secondary | ICD-10-CM

## 2021-12-12 LAB — COMPREHENSIVE METABOLIC PANEL
ALT: 20 U/L (ref 0–53)
AST: 22 U/L (ref 0–37)
Albumin: 4.1 g/dL (ref 3.5–5.2)
Alkaline Phosphatase: 68 U/L (ref 39–117)
BUN: 9 mg/dL (ref 6–23)
CO2: 27 mEq/L (ref 19–32)
Calcium: 9.6 mg/dL (ref 8.4–10.5)
Chloride: 100 mEq/L (ref 96–112)
Creatinine, Ser: 1.01 mg/dL (ref 0.40–1.50)
GFR: 70.27 mL/min (ref >60.00)
Glucose, Bld: 129 mg/dL — ABNORMAL HIGH (ref 70–99)
Potassium: 3.9 mEq/L (ref 3.5–5.1)
Sodium: 139 mEq/L (ref 135–145)
Total Bilirubin: 0.7 mg/dL (ref 0.2–1.2)
Total Protein: 7.6 g/dL (ref 6.0–8.3)

## 2021-12-12 LAB — CBC WITH DIFFERENTIAL/PLATELET
Basophils Absolute: 0 10*3/uL (ref 0.0–0.1)
Basophils Relative: 0.4 % (ref 0.0–3.0)
Eosinophils Absolute: 0.2 10*3/uL (ref 0.0–0.7)
Eosinophils Relative: 3.7 % (ref 0.0–5.0)
HCT: 42.6 % (ref 39.0–52.0)
Hemoglobin: 14.1 g/dL (ref 13.0–17.0)
Lymphocytes Relative: 11.6 % — ABNORMAL LOW (ref 12.0–46.0)
Lymphs Abs: 0.6 10*3/uL — ABNORMAL LOW (ref 0.7–4.0)
MCHC: 33.1 g/dL (ref 30.0–36.0)
MCV: 89.9 fl (ref 78.0–100.0)
Monocytes Absolute: 0.6 10*3/uL (ref 0.1–1.0)
Monocytes Relative: 11.4 % (ref 3.0–12.0)
Neutro Abs: 3.7 10*3/uL (ref 1.4–7.7)
Neutrophils Relative %: 72.9 % (ref 43.0–77.0)
Platelets: 255 10*3/uL (ref 150.0–400.0)
RBC: 4.74 Mil/uL (ref 4.22–5.81)
RDW: 13.9 % (ref 11.5–15.5)
WBC: 5.1 10*3/uL (ref 4.0–10.5)

## 2021-12-12 LAB — HEMOGLOBIN A1C: Hgb A1c MFr Bld: 6.6 % — ABNORMAL HIGH (ref 4.6–6.5)

## 2021-12-12 NOTE — Patient Instructions (Signed)
Stop Terazosyn if BP is low 

## 2021-12-12 NOTE — Assessment & Plan Note (Signed)
S/p L inguinal hernia repair 12/06/21 - -- appetite is better ? ?Wt Readings from Last 3 Encounters:  ?12/12/21 173 lb (78.5 kg)  ?10/11/21 194 lb (88 kg)  ?08/11/21 204 lb (92.5 kg)  ? ? ?

## 2021-12-12 NOTE — Assessment & Plan Note (Signed)
S/p L inguinal hernia repair 12/06/21 - -- appetite is better ?

## 2021-12-12 NOTE — Assessment & Plan Note (Signed)
Low BP ?Stop Terazosyn if BP is low ?

## 2021-12-12 NOTE — Assessment & Plan Note (Signed)
Worse ?Will sch appt w/Dr Delman Cheadle finally agreed ?Check free PSA ?

## 2021-12-12 NOTE — Assessment & Plan Note (Signed)
Check A1c. 

## 2021-12-12 NOTE — Progress Notes (Signed)
? ?Subjective:  ?Patient ID: Brian Mora, male    DOB: [birth year]  Age: 81 y.o. MRN: [id number] ? ?CC: No chief complaint on file. ? ? ?HPI ?Theresa Duty presents for fatigue, wt loss ?S/p L inguinal hernia repair 12/06/21 - -- appetite is better ? ?Outpatient Medications Prior to Visit  ?Medication Sig Dispense Refill  ? acetaminophen (TYLENOL) 325 MG tablet Take 2 tablets (650 mg total) by mouth every 6 (six) hours as needed for mild pain (or Fever >/= 101).    ? aspirin EC 325 MG EC tablet 1 tab a day for the next 30 days to prevent blood clots 30 tablet 0  ? Cholecalciferol (VITAMIN D3) 2000 units capsule Take 1 capsule (2,000 Units total) by mouth daily. 100 capsule 3  ? docusate sodium (COLACE) 100 MG capsule Take 1 capsule (100 mg total) by mouth 2 (two) times daily. 10 capsule 0  ? esomeprazole (NEXIUM) 40 MG capsule TAKE 1 CAPSULE BY MOUTH EVERY DAY 90 capsule 3  ? loratadine (CLARITIN) 10 MG tablet Take 10 mg by mouth daily as needed for allergies.    ? meloxicam (MOBIC) 7.5 MG tablet TAKE 1 TABLET 1-2 TIMES DAILY AS NEEDED FOR ARTHRITIS 60 tablet 3  ? OVER THE COUNTER MEDICATION 1 tablet. OTC antacid from [facility]    ? polyethylene glycol (MIRALAX / GLYCOLAX) packet Take 17 g by mouth 2 (two) times daily. 60 each 0  ? Simethicone (GAS-X PO) Take 1 tablet by mouth as needed.    ? terazosin (HYTRIN) 1 MG capsule TAKE 1 CAPSULE BY MOUTH EVERYDAY AT BEDTIME 90 capsule 3  ? tolterodine (DETROL LA) 4 MG 24 hr capsule Take 1 capsule (4 mg total) by mouth daily. 90 capsule 3  ? traMADol (ULTRAM) 50 MG tablet Take 50 mg by mouth every 6 (six) hours as needed.    ? triamcinolone cream (KENALOG) 0.1 % APPLY  CREAM EXTERNALLY TWICE DAILY 450 g 3  ? ?No facility-administered medications prior to visit.  ? ? ?ROS: ?Review of Systems  ?Constitutional:  Positive for fatigue and unexpected weight change. Negative for appetite change.  ?HENT:  Negative for congestion, nosebleeds, sneezing, sore throat and trouble  swallowing.   ?Eyes:  Negative for itching and visual disturbance.  ?Respiratory:  Negative for cough.   ?Cardiovascular:  Negative for chest pain, palpitations and leg swelling.  ?Gastrointestinal:  Negative for abdominal distention, blood in stool, diarrhea and nausea.  ?Genitourinary:  Negative for frequency and hematuria.  ?Musculoskeletal:  Negative for back pain, gait problem, joint swelling and neck pain.  ?Skin:  Negative for rash.  ?Neurological:  Negative for dizziness, tremors, speech difficulty and weakness.  ?Psychiatric/Behavioral:  Negative for agitation, dysphoric mood and sleep disturbance. The patient is not nervous/anxious.   ? ?Objective:  ?BP 102/70 (BP Location: Left Arm, Patient Position: Sitting, Cuff Size: Normal)   Pulse (!) 109   Temp 98.6 ?F (37 ?C) (Oral)   Ht '6\' 2"'$  (1.88 m)   Wt 173 lb (78.5 kg)   SpO2 98%   BMI 22.21 kg/m?  ? ?BP Readings from Last 3 Encounters:  ?12/12/21 102/70  ?10/11/21 140/78  ?08/11/21 110/60  ? ? ?Wt Readings from Last 3 Encounters:  ?12/12/21 173 lb (78.5 kg)  ?10/11/21 194 lb (88 kg)  ?08/11/21 204 lb (92.5 kg)  ? ? ?Physical Exam ?Constitutional:   ?   General: He is not in acute distress. ?   Appearance: He is well-developed.  ?  Comments: NAD  ?Eyes:  ?   Conjunctiva/sclera: Conjunctivae normal.  ?   Pupils: Pupils are equal, round, and reactive to light.  ?Neck:  ?   Thyroid: No thyromegaly.  ?   Vascular: No JVD.  ?Cardiovascular:  ?   Rate and Rhythm: Normal rate and regular rhythm.  ?   Heart sounds: Normal heart sounds. No murmur heard. ?  No friction rub. No gallop.  ?Pulmonary:  ?   Effort: Pulmonary effort is normal. No respiratory distress.  ?   Breath sounds: Normal breath sounds. No wheezing or rales.  ?Chest:  ?   Chest wall: No tenderness.  ?Abdominal:  ?   General: Bowel sounds are normal. There is no distension.  ?   Palpations: Abdomen is soft. There is no mass.  ?   Tenderness: There is no abdominal tenderness. There is no guarding  or rebound.  ?Musculoskeletal:     ?   General: No tenderness. Normal range of motion.  ?   Cervical back: Normal range of motion.  ?Lymphadenopathy:  ?   Cervical: No cervical adenopathy.  ?Skin: ?   General: Skin is warm and dry.  ?   Findings: No rash.  ?Neurological:  ?   Mental Status: He is alert and oriented to person, place, and time.  ?   Cranial Nerves: No cranial nerve deficit.  ?   Motor: No abnormal muscle tone.  ?   Coordination: Coordination normal.  ?   Gait: Gait normal.  ?   Deep Tendon Reflexes: Reflexes are normal and symmetric.  ?Psychiatric:     ?   Behavior: Behavior normal.     ?   Thought Content: Thought content normal.     ?   Judgment: Judgment normal.  ?Thin ?L groin is healing - residual swelling present; NT ? ?Lab Results  ?Component Value Date  ? WBC 4.1 08/11/2021  ? HGB 13.1 08/11/2021  ? HCT 40.4 08/11/2021  ? PLT 191.0 08/11/2021  ? GLUCOSE 100 (H) 08/11/2021  ? CHOL 230 (H) 08/11/2021  ? TRIG 145.0 08/11/2021  ? HDL 56.80 08/11/2021  ? LDLDIRECT 164.0 08/27/2020  ? LDLCALC 144 (H) 08/11/2021  ? ALT 22 08/11/2021  ? AST 20 08/11/2021  ? NA 139 08/11/2021  ? K 4.0 08/11/2021  ? CL 102 08/11/2021  ? CREATININE 1.03 08/11/2021  ? BUN 11 08/11/2021  ? CO2 28 08/11/2021  ? TSH 0.84 08/11/2021  ? PSA 36.01 (H) 08/11/2021  ? INR 1.02 10/29/2014  ? HGBA1C 7.3 (H) 08/11/2021  ? MICROALBUR 0.7 08/27/2020  ? ? ?US Abdomen Complete ? ?Result Date: 03/13/2016 ?CLINICAL DATA:  Abdominal pain . EXAM: ABDOMEN ULTRASOUND COMPLETE COMPARISON:  None. FINDINGS: Gallbladder: No gallstones or wall thickening visualized. No sonographic Murphy sign noted by sonographer. Common bile duct: Diameter: 5.7 mm Liver: There is echogenic consistent fatty infiltration and/or hepatocellular disease. No focal hepatic abnormality identified. IVC: No abnormality visualized. Pancreas: Visualized portion unremarkable. Spleen: Size and appearance within normal limits. Right Kidney: Length: 12.2 cm. Echogenicity within  normal limits. No mass or hydronephrosis visualized. Left Kidney: Length: 11.9 cm. Echogenicity within normal limits. No mass or hydronephrosis visualized. 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 Other findings: None. IMPRESSION: 1. Liver is echogenic consistent fatty infiltration and/or hepatocellular disease . No acute abnormality. 2. 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 Recommend followup by ultrasound in 5 years. This recommendation follows ACR consensus guidelines:  White Paper of the ACR Incidental Findings Committee II on Vascular Findings. J Am Coll Radiol 2013; 10:789-794. Electronically Signed   By: Marcello Moores  Register   On: 03/13/2016 12:29  ? ? ?Assessment & Plan:  ? ?Problem List Items Addressed This Visit   ? ? GERD (gastroesophageal reflux disease)  ?  Cont on Nexium ? ?  ?  ? HTN (hypertension)  ?  Low BP ?Stop Terazosyn if BP is low ? ?  ?  ? Elevated PSA  ?  Worse ?Will sch appt w/Dr Delman Cheadle finally agreed ?Check free PSA ?  ?  ? Relevant Orders  ? PSA, total and free  ? Ambulatory referral to Urology  ? Inguinal hernia  ?  S/p L inguinal hernia repair 12/06/21  ?L groin is healing - residual swelling present; NT ?  ?  ? LLQ abdominal pain  ?  S/p L inguinal hernia repair 12/06/21 - -- appetite is better ?  ?  ? Weight loss - Primary  ?  S/p L inguinal hernia repair 12/06/21 - -- appetite is better ? ?Wt Readings from Last 3 Encounters:  ?12/12/21 173 lb (78.5 kg)  ?10/11/21 194 lb (88 kg)  ?08/11/21 204 lb (92.5 kg)  ? ?  ?  ? Relevant Orders  ? CBC with Differential/Platelet  ? Comprehensive metabolic panel  ? Hemoglobin A1c  ? Hyperglycemia  ?  Check A1c ? ?  ?  ? Relevant Orders  ? Hemoglobin A1c  ?  ? ? ?No orders of the defined types were placed in this encounter. ?  ? ? ?Follow-up: Return in about 3 months (around 03/14/2022) for a follow-up visit. ? ?Walker Kehr, MD ?

## 2021-12-12 NOTE — Assessment & Plan Note (Addendum)
S/p L inguinal hernia repair 12/06/21  ?L groin is healing - residual swelling present; NT ?

## 2021-12-12 NOTE — Assessment & Plan Note (Signed)
Cont on Nexium ?

## 2021-12-14 LAB — PSA, TOTAL AND FREE
PSA, Free: 3.5 ng/mL
PSA, Total: 46.4 ng/mL — ABNORMAL HIGH (ref <=4.0)

## 2021-12-21 ENCOUNTER — Ambulatory Visit: Payer: Medicare Other | Admitting: Podiatry

## 2022-01-13 ENCOUNTER — Other Ambulatory Visit: Payer: Self-pay | Admitting: Internal Medicine

## 2022-02-21 ENCOUNTER — Telehealth: Payer: Self-pay | Admitting: Internal Medicine

## 2022-02-21 NOTE — Telephone Encounter (Signed)
Brian Mora with [facility] calls today requesting documentation/information regarding PT's referral. Brian Mora would like this info faxed to her at   Fax: [phone]  CB if needed: [phone] [id number]   PT's appointment with them isn't until July 31st but they require this documentation before finalizing the appointment

## 2022-02-23 NOTE — Telephone Encounter (Signed)
Faxed over last PSA to [facility].Marland KitchenJohny Mora

## 2022-03-15 ENCOUNTER — Encounter: Payer: Self-pay | Admitting: Internal Medicine

## 2022-03-15 ENCOUNTER — Ambulatory Visit: Payer: Medicare Other | Admitting: Internal Medicine

## 2022-03-15 ENCOUNTER — Telehealth: Payer: Self-pay | Admitting: *Deleted

## 2022-03-15 VITALS — BP 108/68 | HR 108 | Temp 97.7°F | Ht 74.0 in | Wt 153.8 lb

## 2022-03-15 DIAGNOSIS — R634 Abnormal weight loss: Secondary | ICD-10-CM

## 2022-03-15 DIAGNOSIS — K4090 Unilateral inguinal hernia, without obstruction or gangrene, not specified as recurrent: Secondary | ICD-10-CM | POA: Diagnosis not present

## 2022-03-15 DIAGNOSIS — I10 Essential (primary) hypertension: Secondary | ICD-10-CM

## 2022-03-15 DIAGNOSIS — R7309 Other abnormal glucose: Secondary | ICD-10-CM

## 2022-03-15 LAB — COMPREHENSIVE METABOLIC PANEL
ALT: 51 U/L (ref 0–53)
AST: 29 U/L (ref 0–37)
Albumin: 4 g/dL (ref 3.5–5.2)
Alkaline Phosphatase: 77 U/L (ref 39–117)
BUN: 10 mg/dL (ref 6–23)
CO2: 32 mEq/L (ref 19–32)
Calcium: 9.2 mg/dL (ref 8.4–10.5)
Chloride: 104 mEq/L (ref 96–112)
Creatinine, Ser: 0.98 mg/dL (ref 0.40–1.50)
GFR: 72.73 mL/min (ref >60.00)
Glucose, Bld: 97 mg/dL (ref 70–99)
Potassium: 3.9 mEq/L (ref 3.5–5.1)
Sodium: 144 mEq/L (ref 135–145)
Total Bilirubin: 0.5 mg/dL (ref 0.2–1.2)
Total Protein: 6.8 g/dL (ref 6.0–8.3)

## 2022-03-15 LAB — CBC WITH DIFFERENTIAL/PLATELET
Basophils Absolute: 0 10*3/uL (ref 0.0–0.1)
Basophils Relative: 0.5 % (ref 0.0–3.0)
Eosinophils Absolute: 0 10*3/uL (ref 0.0–0.7)
Eosinophils Relative: 0.4 % (ref 0.0–5.0)
HCT: 40.7 % (ref 39.0–52.0)
Hemoglobin: 13.5 g/dL (ref 13.0–17.0)
Lymphocytes Relative: 17.1 % (ref 12.0–46.0)
Lymphs Abs: 0.9 10*3/uL (ref 0.7–4.0)
MCHC: 33.2 g/dL (ref 30.0–36.0)
MCV: 89.9 fl (ref 78.0–100.0)
Monocytes Absolute: 0.5 10*3/uL (ref 0.1–1.0)
Monocytes Relative: 10.1 % (ref 3.0–12.0)
Neutro Abs: 3.6 10*3/uL (ref 1.4–7.7)
Neutrophils Relative %: 71.9 % (ref 43.0–77.0)
Platelets: 281 10*3/uL (ref 150.0–400.0)
RBC: 4.53 Mil/uL (ref 4.22–5.81)
RDW: 14.5 % (ref 11.5–15.5)
WBC: 5 10*3/uL (ref 4.0–10.5)

## 2022-03-15 LAB — HEMOGLOBIN A1C: Hgb A1c MFr Bld: 6.8 % — ABNORMAL HIGH (ref 4.6–6.5)

## 2022-03-15 LAB — TSH: TSH: 0.98 u[IU]/mL (ref 0.35–5.50)

## 2022-03-15 MED ORDER — LORAZEPAM 1 MG PO TABS: 0.5000 mg | ORAL_TABLET | Freq: Every evening | ORAL | 3 refills | Status: DC | PRN

## 2022-03-15 NOTE — Telephone Encounter (Signed)
Rec'd fax Lorazepam is on back order. Requesting something else..Brian Mora

## 2022-03-15 NOTE — Assessment & Plan Note (Signed)
Wt Readings from Last 3 Encounters:  03/15/22 153 lb 12.8 oz (69.8 kg)  12/12/21 173 lb (78.5 kg)  10/11/21 194 lb (88 kg)  Cont to watch

## 2022-03-15 NOTE — Progress Notes (Signed)
Subjective:  Patient ID: Brian Mora, male    DOB: 24-Jul-1941  Age: 81 y.o. MRN: [id number]  CC: 3 month f/u (Patient and his daughter are concerned about him feeling anxious a lot lately. Pt stated that he feels more anxious at night time, he has trouble sleeping because of this. Not so bad during the day.)   HPI [name] presents for HTN, BPH. C/o anxiety, insomnia He is here w/dtr Roselyn Reef  Outpatient Medications Prior to Visit  Medication Sig Dispense Refill   acetaminophen (TYLENOL) 325 MG tablet Take 2 tablets (650 mg total) by mouth every 6 (six) hours as needed for mild pain (or Fever >/= 101).     aspirin EC 325 MG EC tablet 1 tab a day for the next 30 days to prevent blood clots 30 tablet 0   Cholecalciferol (VITAMIN D3) 2000 units capsule Take 1 capsule (2,000 Units total) by mouth daily. 100 capsule 3   docusate sodium (COLACE) 100 MG capsule Take 1 capsule (100 mg total) by mouth 2 (two) times daily. 10 capsule 0   esomeprazole (NEXIUM) 40 MG capsule TAKE 1 CAPSULE BY MOUTH EVERY DAY 90 capsule 3   loratadine (CLARITIN) 10 MG tablet Take 10 mg by mouth daily as needed for allergies.     meloxicam (MOBIC) 7.5 MG tablet TAKE 1 TABLET 1-2 TIMES DAILY AS NEEDED FOR ARTHRITIS 60 tablet 3   OVER THE COUNTER MEDICATION 1 tablet. OTC antacid from [facility]     polyethylene glycol (MIRALAX / GLYCOLAX) packet Take 17 g by mouth 2 (two) times daily. 60 each 0   Simethicone (GAS-X PO) Take 1 tablet by mouth as needed.     tolterodine (DETROL LA) 4 MG 24 hr capsule Take 1 capsule (4 mg total) by mouth daily. 90 capsule 3   traMADol (ULTRAM) 50 MG tablet Take 50 mg by mouth every 6 (six) hours as needed.     triamcinolone cream (KENALOG) 0.1 % APPLY  CREAM EXTERNALLY TWICE DAILY 450 g 3   terazosin (HYTRIN) 1 MG capsule TAKE 1 CAPSULE BY MOUTH EVERYDAY AT BEDTIME 90 capsule 3   No facility-administered medications prior to visit.    ROS: Review of Systems  Constitutional:   Negative for appetite change, fatigue and unexpected weight change.  HENT:  Negative for congestion, nosebleeds, sneezing, sore throat and trouble swallowing.   Eyes:  Negative for itching and visual disturbance.  Respiratory:  Negative for cough.   Cardiovascular:  Negative for chest pain, palpitations and leg swelling.  Gastrointestinal:  Negative for abdominal distention, blood in stool, diarrhea and nausea.  Genitourinary:  Negative for frequency and hematuria.  Musculoskeletal:  Negative for back pain, gait problem, joint swelling and neck pain.  Skin:  Negative for rash.  Neurological:  Negative for dizziness, tremors, speech difficulty and weakness.  Psychiatric/Behavioral:  Positive for decreased concentration and sleep disturbance. Negative for agitation, confusion, dysphoric mood and suicidal ideas. The patient is nervous/anxious.     Objective:  BP 108/68   Pulse (!) 108   Temp 97.7 F (36.5 C) (Oral)   Ht '6\' 2"'$  (1.88 m)   Wt 153 lb 12.8 oz (69.8 kg)   SpO2 98%   BMI 19.75 kg/m   BP Readings from Last 3 Encounters:  03/15/22 108/68  12/12/21 102/70  10/11/21 140/78    Wt Readings from Last 3 Encounters:  03/15/22 153 lb 12.8 oz (69.8 kg)  12/12/21 173 lb (78.5 kg)  10/11/21 194  lb (88 kg)    Physical Exam Constitutional:      General: He is not in acute distress.    Appearance: Normal appearance. He is well-developed.     Comments: NAD  Eyes:     Conjunctiva/sclera: Conjunctivae normal.     Pupils: Pupils are equal, round, and reactive to light.  Neck:     Thyroid: No thyromegaly.     Vascular: No JVD.  Cardiovascular:     Rate and Rhythm: Normal rate and regular rhythm.     Heart sounds: Normal heart sounds. No murmur heard.    No friction rub. No gallop.  Pulmonary:     Effort: Pulmonary effort is normal. No respiratory distress.     Breath sounds: Normal breath sounds. No wheezing or rales.  Chest:     Chest wall: No tenderness.  Abdominal:      General: Bowel sounds are normal. There is no distension.     Palpations: Abdomen is soft. There is no mass.     Tenderness: There is no abdominal tenderness. There is no guarding or rebound.  Musculoskeletal:        General: No tenderness. Normal range of motion.     Cervical back: Normal range of motion.  Lymphadenopathy:     Cervical: No cervical adenopathy.  Skin:    General: Skin is warm and dry.     Findings: No rash.  Neurological:     Mental Status: He is alert and oriented to person, place, and time.     Cranial Nerves: No cranial nerve deficit.     Motor: No abnormal muscle tone.     Coordination: Coordination normal.     Gait: Gait normal.     Deep Tendon Reflexes: Reflexes are normal and symmetric.  Psychiatric:        Behavior: Behavior normal.        Thought Content: Thought content normal.        Judgment: Judgment normal.     Lab Results  Component Value Date   WBC 5.0 03/15/2022   HGB 13.5 03/15/2022   HCT 40.7 03/15/2022   PLT 281.0 03/15/2022   GLUCOSE 97 03/15/2022   CHOL 230 (H) 08/11/2021   TRIG 145.0 08/11/2021   HDL 56.80 08/11/2021   LDLDIRECT 164.0 08/27/2020   LDLCALC 144 (H) 08/11/2021   ALT 51 03/15/2022   AST 29 03/15/2022   NA 144 03/15/2022   K 3.9 03/15/2022   CL 104 03/15/2022   CREATININE 0.98 03/15/2022   BUN 10 03/15/2022   CO2 32 03/15/2022   TSH 0.98 03/15/2022   PSA 36.01 (H) 08/11/2021   INR 1.02 10/29/2014   HGBA1C 6.8 (H) 03/15/2022   MICROALBUR 0.7 08/27/2020    US Abdomen Complete  Result Date: 03/13/2016 CLINICAL DATA:  Abdominal pain . EXAM: ABDOMEN ULTRASOUND COMPLETE COMPARISON:  None. FINDINGS: Gallbladder: No gallstones or wall thickening visualized. No sonographic Murphy sign noted by sonographer. Common bile duct: Diameter: 5.7 mm Liver: There is echogenic consistent fatty infiltration and/or hepatocellular disease. No focal hepatic abnormality identified. IVC: No abnormality visualized. Pancreas: Visualized  portion unremarkable. Spleen: Size and appearance within normal limits. Right Kidney: Length: 12.2 cm. Echogenicity within normal limits. No mass or hydronephrosis visualized. Left Kidney: Length: 11.9 cm. Echogenicity within normal limits. No mass or hydronephrosis visualized. 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 Other findings: None. IMPRESSION: 1. Liver is echogenic consistent fatty infiltration and/or hepatocellular disease . No acute abnormality. 2. 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 Ectatic abdominal aorta at risk for aneurysm development. Recommend followup by ultrasound in 5 years. This recommendation follows ACR consensus guidelines: White Paper of the ACR Incidental Findings Committee II on Vascular Findings. J Am Coll Radiol 2013; 10:789-794. Electronically Signed   By: Marcello Moores  Register   On: 03/13/2016 12:29    Assessment & Plan:   Problem List Items Addressed This Visit     Elevated glucose   Relevant Orders   Hemoglobin A1c (Completed)   HTN (hypertension)    Stop Terazosyn if BP is low      Relevant Orders   Comprehensive metabolic panel (Completed)   CBC with Differential/Platelet (Completed)   Inguinal hernia    S/p L inguinal hernia repair 12/06/21       Weight loss - Primary    Wt Readings from Last 3 Encounters:  03/15/22 153 lb 12.8 oz (69.8 kg)  12/12/21 173 lb (78.5 kg)  10/11/21 194 lb (88 kg)  Cont to watch       Relevant Orders   Comprehensive metabolic panel (Completed)   CBC with Differential/Platelet (Completed)   TSH (Completed)   Hemoglobin A1c (Completed)      Meds ordered this encounter  Medications   LORazepam (ATIVAN) 1 MG tablet    Sig: Take 0.5-1 tablets (0.5-1 mg total) by mouth at bedtime as needed for anxiety or sleep.    Dispense:  30 tablet    Refill:  3      Follow-up: Return in about 3 months (around 06/15/2022) for a follow-up visit.  Walker Kehr, MD

## 2022-03-15 NOTE — Assessment & Plan Note (Signed)
S/p L inguinal hernia repair 12/06/21

## 2022-03-15 NOTE — Assessment & Plan Note (Signed)
Stop Terazosyn if BP is low

## 2022-03-17 MED ORDER — ALPRAZOLAM 0.5 MG PO TABS: 0.5000 mg | ORAL_TABLET | Freq: Every evening | ORAL | 3 refills | Status: DC | PRN

## 2022-03-17 NOTE — Telephone Encounter (Signed)
Okay alprazolam.  Thanks

## 2022-03-30 ENCOUNTER — Other Ambulatory Visit (HOSPITAL_COMMUNITY): Payer: Self-pay | Admitting: Urology

## 2022-03-30 DIAGNOSIS — C61 Malignant neoplasm of prostate: Secondary | ICD-10-CM

## 2022-04-21 IMAGING — DX DG LUMBAR SPINE 2-3V
3 series · 3 of 3 positions shown · non-contrast
Comparison: None.

CLINICAL DATA: Right-sided lumbar pain

EXAM:
LUMBAR SPINE - 2-3 VIEW

[l-spine ap]
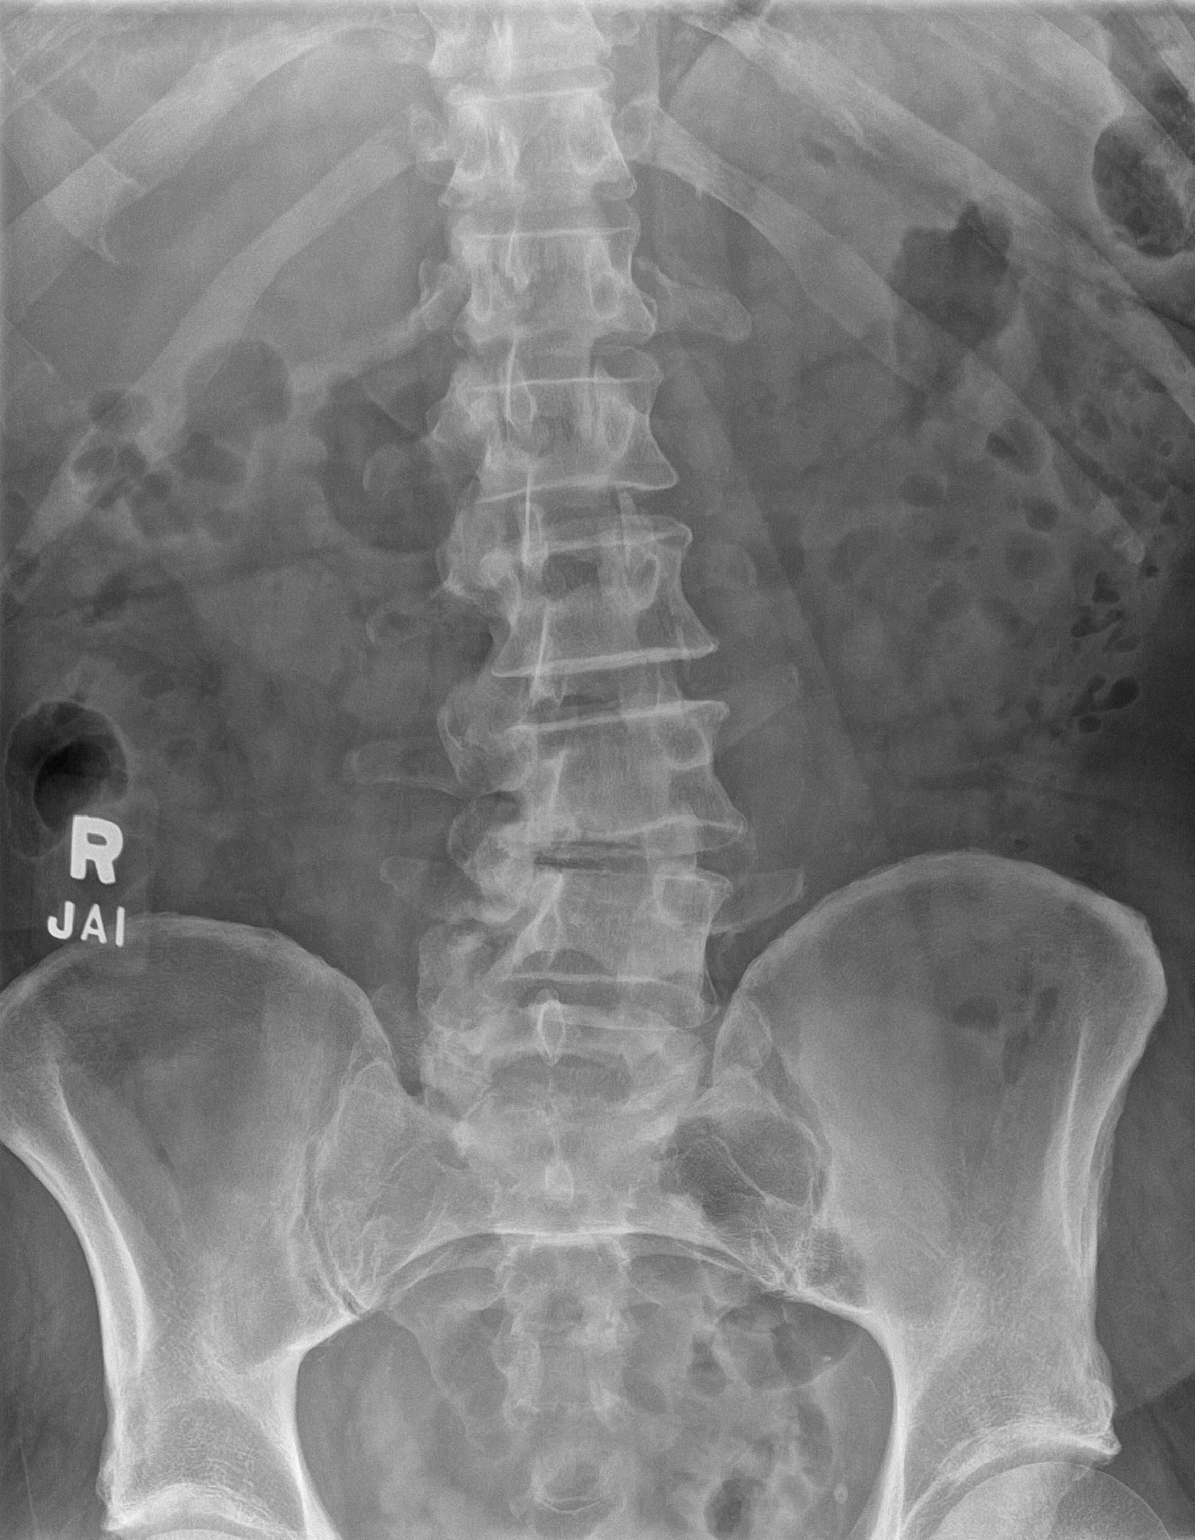

[l-spine lateral (1 of 2)]
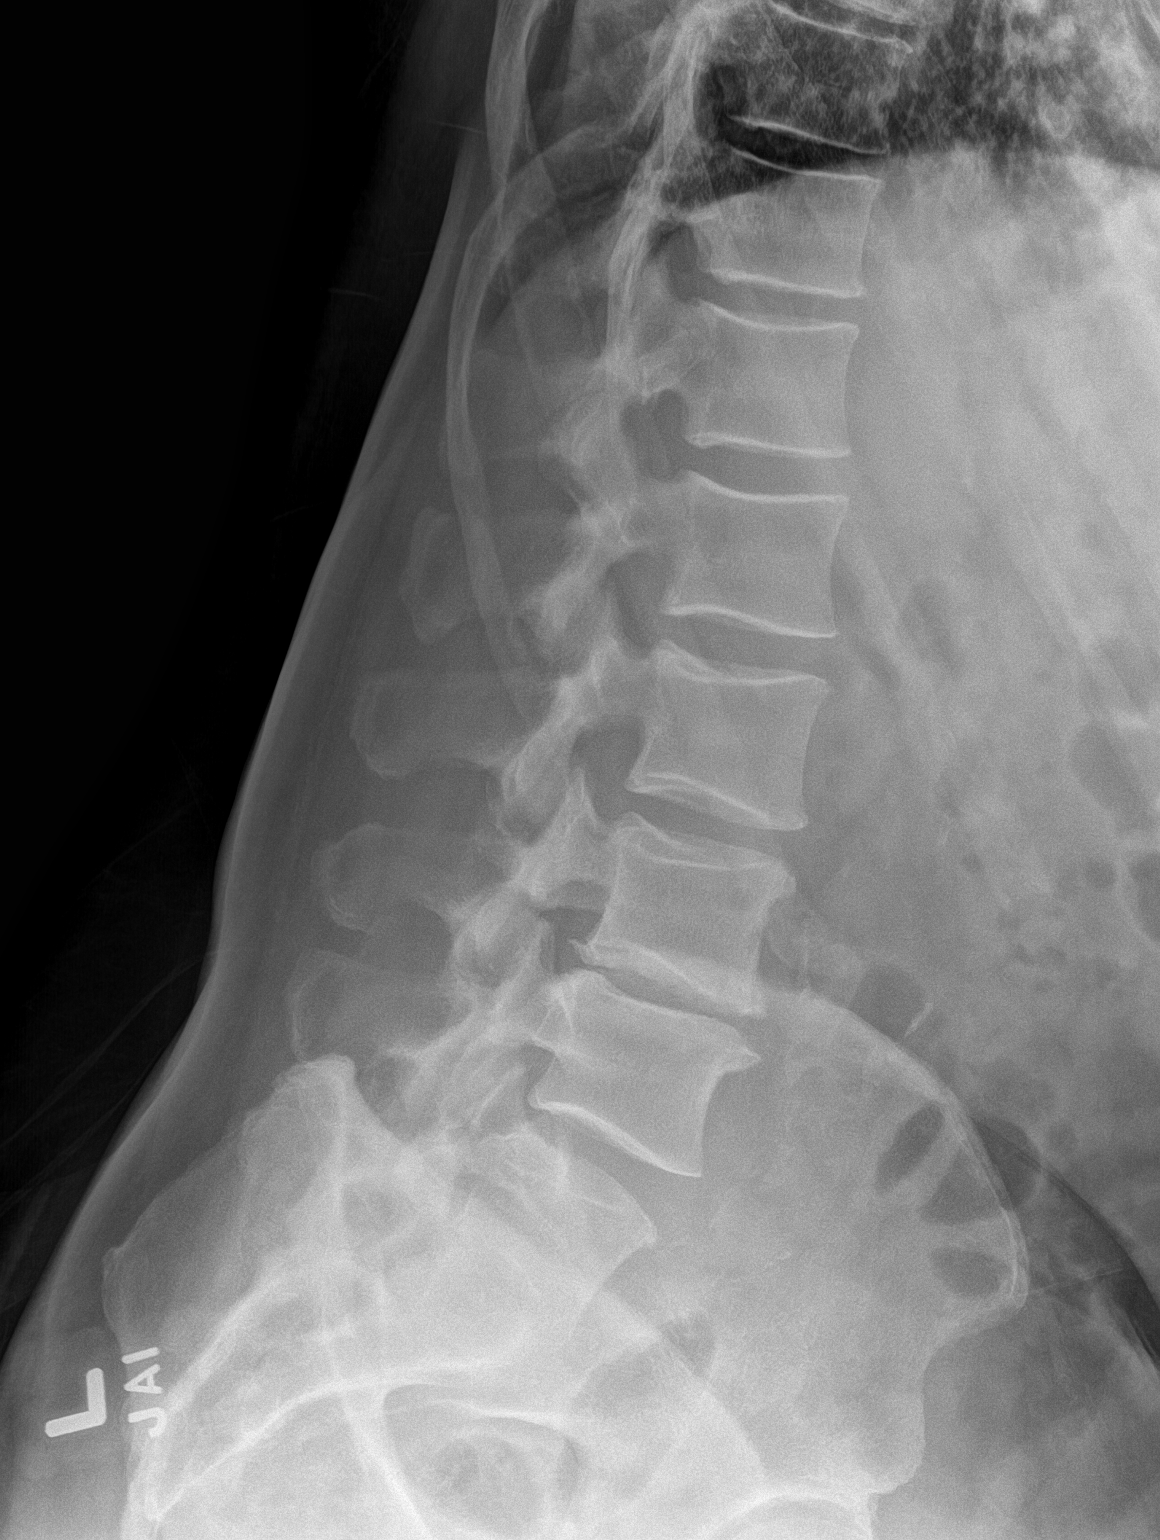

[l-spine lateral (2 of 2)]
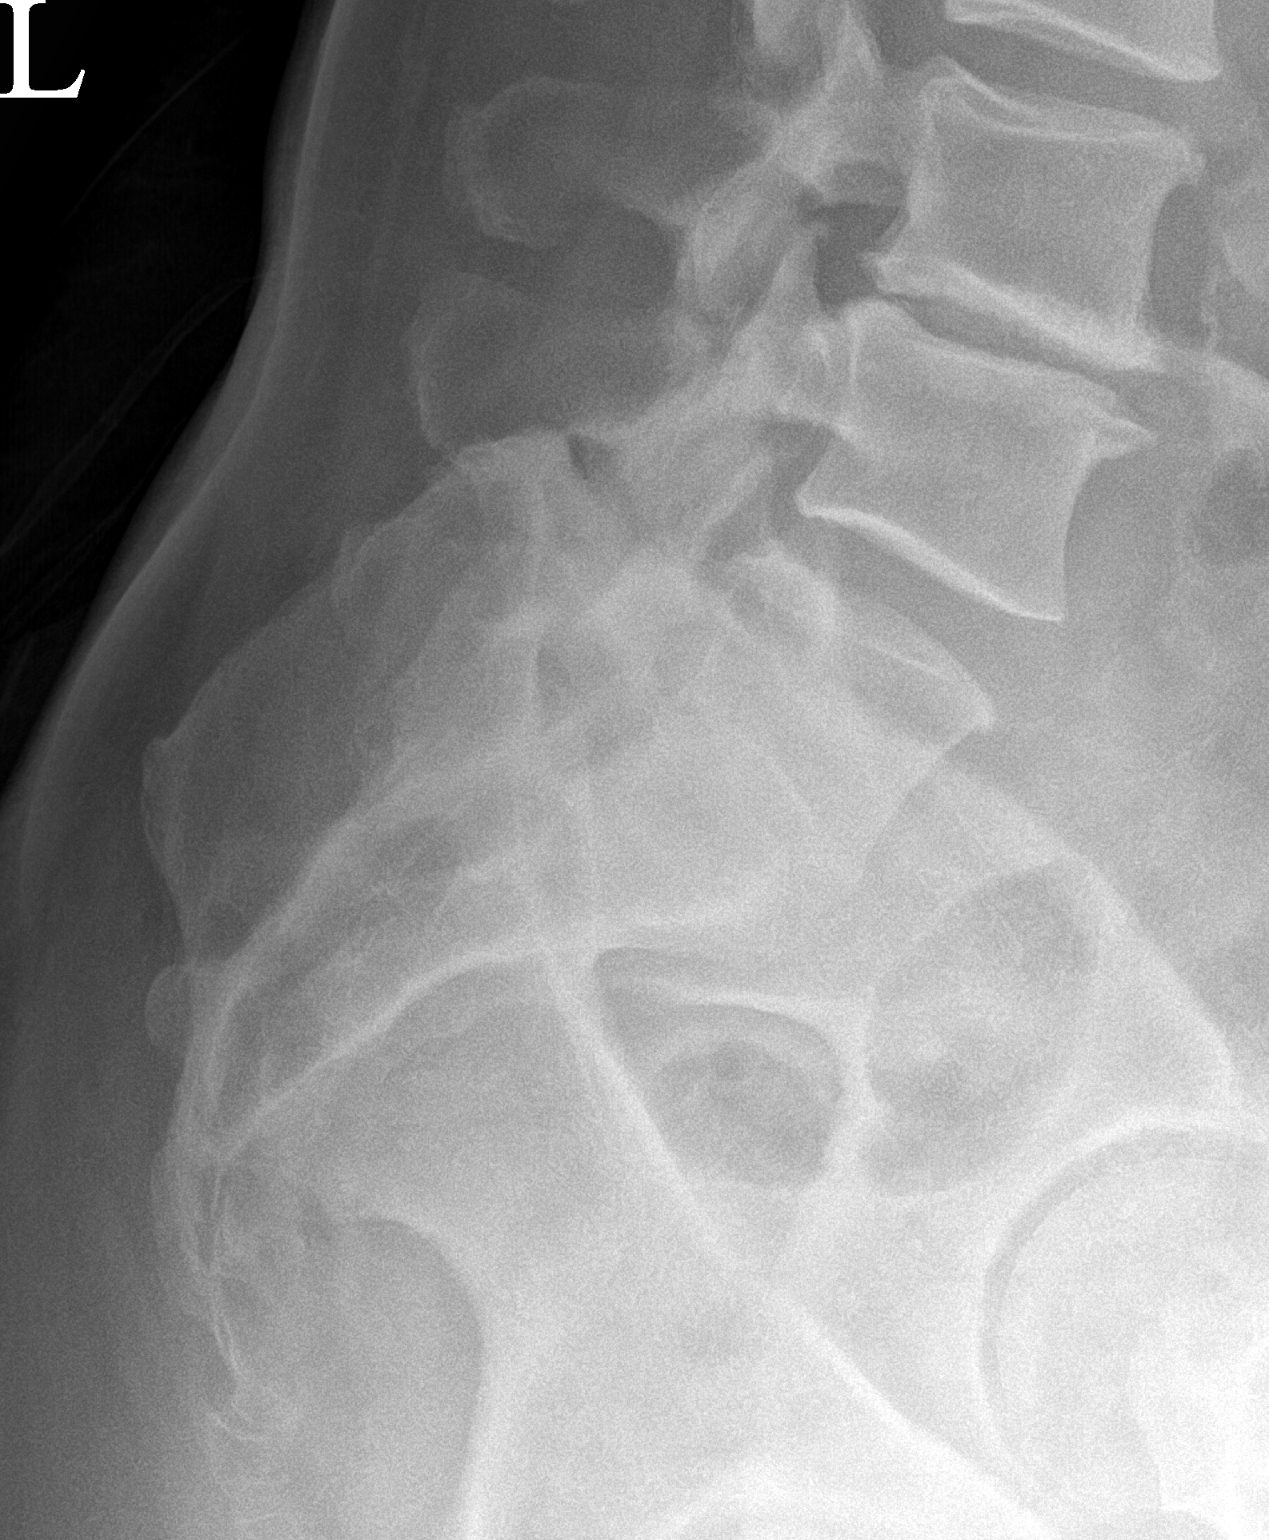

[3 of 3 positions shown; findings below may reference images not displayed]

FINDINGS: Five lumbar type vertebral bodies. There is no evidence of lumbar
spine fracture. Levocurvature of the lumbar spine. Mild disc height
loss at L4-L5. Facet arthropathy in the lower lumbar spine.
IMPRESSION: Degenerative changes in the lower lumbar spine, worst at L4-L5.

## 2022-05-01 ENCOUNTER — Encounter (HOSPITAL_COMMUNITY)
Admission: RE | Admit: 2022-05-01 | Discharge: 2022-05-01 | Disposition: A | Discharge status: Home / Self Care | Payer: Medicare Other | Source: Ambulatory Visit | Attending: Urology | Admitting: Urology

## 2022-05-01 DIAGNOSIS — C61 Malignant neoplasm of prostate: Secondary | ICD-10-CM | POA: Insufficient documentation

## 2022-05-01 MED ORDER — PIFLIFOLASTAT F 18 (PYLARIFY) INJECTION
9.0000 | Freq: Once | INTRAVENOUS | Status: AC
  Administered 2022-05-01: 9 via INTRAVENOUS

## 2022-05-08 ENCOUNTER — Ambulatory Visit: Payer: Medicare Other | Admitting: Podiatry

## 2022-05-08 ENCOUNTER — Encounter: Payer: Self-pay | Admitting: Podiatry

## 2022-05-08 DIAGNOSIS — B351 Tinea unguium: Secondary | ICD-10-CM | POA: Diagnosis not present

## 2022-05-08 DIAGNOSIS — M79675 Pain in left toe(s): Secondary | ICD-10-CM | POA: Diagnosis not present

## 2022-05-08 DIAGNOSIS — I10 Essential (primary) hypertension: Secondary | ICD-10-CM

## 2022-05-08 DIAGNOSIS — M79674 Pain in right toe(s): Secondary | ICD-10-CM | POA: Diagnosis not present

## 2022-05-08 NOTE — Progress Notes (Signed)
This patient presents  to the office for evaluation and treatment of long thick painful nails .  This patient is unable to trim his own nails since the patient cannot reach his feet.  Patient says the nails are painful walking and wearing his shoes. He also says he experiences pain under both big toes.  He presents  for preventive foot care services.  General Appearance  Alert, conversant and in no acute stress.  Vascular  Dorsalis pedis and posterior tibial  pulses are palpable  bilaterally.  Capillary return is within normal limits  bilaterally. Temperature is within normal limits  bilaterally.  Neurologic  Senn-Weinstein monofilament wire test diminished  bilaterally. Muscle power within normal limits bilaterally.  Nails Thick disfigured discolored nails with subungual debris  from hallux to fifth toes bilaterally. No evidence of bacterial infection or drainage bilaterally.  Orthopedic  No limitations of motion  feet .  No crepitus or effusions noted.  No bony pathology or digital deformities noted. Dorsiflexed distal phalanx  hallux B/L with bony prominence from proximal phalanx plantarly. HAV  left severe.  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  Skin  normotropic skin with no porokeratosis noted bilaterally.  No signs of infections or ulcers noted.     Onychomycosis  Pain in toes right foot  Pain in toes left foot  Debridement  of nails  1-5  B/L with a nail nipper.  Nails were then filed using a dremel tool with no incidents.  Discussed his big toe pain with patient and dispensed toe cap for hallux. Patient wanted to see surgical podiatrist.  for bunion. RTC  prn   Gardiner Barefoot DPM

## 2022-05-15 NOTE — Progress Notes (Signed)
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  IPSS:   2 SHIM:  refused to asnwer  Bowel/Bladder complaints, if any:   No Nausea/Vomiting, if any: No  Pain issues, if any:  5/10 bilateral shoulders and back  SAFETY ISSUES: Prior radiation? No Pacemaker/ICD? No Possible current pregnancy? Male Is the patient on methotrexate? No  Current Complaints / other details:  Need more information.

## 2022-05-19 ENCOUNTER — Other Ambulatory Visit: Payer: Self-pay

## 2022-05-19 ENCOUNTER — Ambulatory Visit
Admission: RE | Admit: 2022-05-19 | Discharge: 2022-05-19 | Disposition: A | Discharge status: Home / Self Care | Payer: Medicare Other | Source: Ambulatory Visit | Attending: Radiation Oncology | Admitting: Radiation Oncology

## 2022-05-19 VITALS — BP 152/87 | HR 104 | Temp 97.5°F | Resp 18 | Ht 74.0 in | Wt 175.5 lb

## 2022-05-19 DIAGNOSIS — R972 Elevated prostate specific antigen [PSA]: Secondary | ICD-10-CM | POA: Diagnosis not present

## 2022-05-19 DIAGNOSIS — Z87891 Personal history of nicotine dependence: Secondary | ICD-10-CM | POA: Diagnosis not present

## 2022-05-19 DIAGNOSIS — C61 Malignant neoplasm of prostate: Secondary | ICD-10-CM | POA: Insufficient documentation

## 2022-05-19 NOTE — Progress Notes (Signed)
Radiation Oncology         [phone] ________________________________  Initial Outpatient Consultation  Name: Brian Mora MRN: [id number]  Date: 05/19/2022  DOB: Aug 01, 1941  WU:[id number], [name], MD  [name], MD   REFERRING PHYSICIAN: [name], MD  DIAGNOSIS: 81 y.o. gentleman with Stage T1c adenocarcinoma of the prostate with Gleason score of 3+4, and PSA of 43.    ICD-10-CM   1. Malignant neoplasm of prostate (HCC)  C61       HISTORY OF PRESENT ILLNESS: Brian Mora is a 81 y.o. male with a diagnosis of prostate cancer. He was noted to have a rising, elevated PSA from 6.4 in 08/2016 to 11.6 in 2020 with his primary care physician, Dr. Posey Rea.  Accordingly, he was referred for evaluation in urology by Dr. Mena Goes on 09/17/18, repeat PSA obtained that day showed stability at 11.6. He was scheduled for biopsy but ultimately cancelled.  His PSA was followed by his PCP and increased to 46 more recently in 12/2021, prompting referral back to Dr. Mena Goes on 03/06/22. Repeat PSA obtained at that visit showed persistent elevation at 43. The patient proceeded to transrectal ultrasound with 12 biopsies of the prostate on 03/22/22.  The prostate volume measured 31 cc.  Out of 12 core biopsies, 5 were positive.  The maximum Gleason score was 3+4, and this was seen in the left base lateral Left mid lateral, left apex lateral and right apex lateral. Additionally, there was Gleason 3+3 in the right apex.  Given the significantly elevated PSA, he underwent staging PSMA PET scan on 05/01/22 showing mild to moderate radiotracer activity within the prostate gland but no metastatic adenopathy, visceral metastasis, or skeletal metastasis. Incidentally noted was a 1.2 cm left pulmonary nodule without PSMA radiotracer activity, unknown significance.  The patient reviewed the biopsy results with his urologist and he has kindly been referred today for discussion of potential  radiation treatment options.   PREVIOUS RADIATION THERAPY: No  PAST MEDICAL HISTORY:  Past Medical History:  Diagnosis Date   Acquired hypertrophic pyloric stenosis    gastric outlet obstruction   Dyspepsia and other specified disorders of function of stomach    GERD (gastroesophageal reflux disease)    nexium   as needed   Hyperlipidemia    Other esophagitis    erosive esophagitis   Primary localized osteoarthritis of left knee    Sexually transmitted disease (STD)    syphyillis in youth-medicdally treated   Urgency of urination       PAST SURGICAL HISTORY: Past Surgical History:  Procedure Laterality Date   BUNIONECTOMY Right 1996   CIRCUMCISION  1996   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   HERNIA REPAIR     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   Procedure: TOTAL KNEE ARTHROPLASTY;  Surgeon: Salvatore Marvel, MD;  Location: [facility] OR;  Service: Orthopedics;  Laterality: Left;    FAMILY HISTORY:  Family History  Problem Relation Age of Onset   Heart disease Mother    Stroke Mother    Benign prostatic hyperplasia Father    Alzheimer's disease Father    Cancer Sister    Diabetes Neg Hx    COPD Neg Hx     SOCIAL HISTORY:  Social History   Socioeconomic History   Marital status: Divorced    Spouse name: Not on file   Number of children: 3   Years of education: 12   Highest education level: Not on file  Occupational History  Occupation: retired   Occupation: Cab Air traffic controller: Production assistant, radio FOR SELF EMPLOYED  Tobacco Use   Smoking status: Former    Packs/day: 0.50    Years: 20.00    Total pack years: 10.00    Types: Cigarettes    Quit date: 01/29/1993    Years since quitting: 29.3   Smokeless tobacco: Never   Tobacco comments:    Quit in 1989  Substance and Sexual Activity   Alcohol use: No    Alcohol/week: 0.0 standard drinks of alcohol   Drug use: No   Sexual activity: Yes  Other Topics Concern   Not on file  Social History Narrative   HSG.  Married '65 - 12 yrs/divorce, Married '80 -1 yr/divorced. 1 dtr - '67; 2 sons '63, '79; 4 grandchildren. Lives alone. 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    Lives alone, son and daughter local, active in care.    Social Determinants of Health   Financial Resource Strain: Low Risk  (08/13/2018)   Overall Financial Resource Strain (CARDIA)    Difficulty of Paying Living Expenses: Not hard at all  Food Insecurity: No Food Insecurity (08/13/2018)   Hunger Vital Sign    Worried About Running Out of Food in the Last Year: Never true    Ran Out of Food in the Last Year: Never true  Transportation Needs: No Transportation Needs (08/13/2018)   PRAPARE - Administrator, Civil Service (Medical): No    Lack of Transportation (Non-Medical): No  Physical Activity: Insufficiently Active (08/13/2018)   Exercise Vital Sign    Days of Exercise per Week: 3 days    Minutes of Exercise per Session: 20 min  Stress: No Stress Concern Present (08/13/2018)   [employer] of Occupational Health - Occupational Stress Questionnaire    Feeling of Stress : Only a little  Social Connections: Unknown (08/13/2018)   Social Connection and Isolation Panel [NHANES]    Frequency of Communication with Friends and Family: Three times a week    Frequency of Social Gatherings with Friends and Family: Three times a week    Attends Religious Services: Not on file    Active Member of Clubs or Organizations: Not on file    Attends Banker Meetings: Not on file    Marital Status: Divorced  Intimate Partner Violence: Not on file    ALLERGIES: Lovastatin  MEDICATIONS:  Current Outpatient Medications  Medication Sig Dispense Refill   acetaminophen (TYLENOL) 325 MG tablet Take 2 tablets (650 mg total) by mouth every 6 (six) hours as needed for mild pain (or Fever >/= 101).     ALPRAZolam (XANAX) 0.5 MG tablet Take 1 tablet (0.5 mg total) by mouth at bedtime as needed for anxiety. Insomnia 30  tablet 3   aspirin EC 325 MG EC tablet 1 tab a day for the next 30 days to prevent blood clots 30 tablet 0   Cholecalciferol (VITAMIN D3) 2000 units capsule Take 1 capsule (2,000 Units total) by mouth daily. 100 capsule 3   docusate sodium (COLACE) 100 MG capsule Take 1 capsule (100 mg total) by mouth 2 (two) times daily. 10 capsule 0   esomeprazole (NEXIUM) 40 MG capsule TAKE 1 CAPSULE BY MOUTH EVERY DAY 90 capsule 3   loratadine (CLARITIN) 10 MG tablet Take 10 mg by mouth daily as needed for allergies.     meloxicam (MOBIC) 7.5 MG tablet TAKE 1 TABLET 1-2 TIMES DAILY AS  NEEDED FOR ARTHRITIS 60 tablet 3   OVER THE COUNTER MEDICATION 1 tablet. OTC antacid from [facility]     polyethylene glycol (MIRALAX / GLYCOLAX) packet Take 17 g by mouth 2 (two) times daily. 60 each 0   tolterodine (DETROL LA) 4 MG 24 hr capsule Take 1 capsule (4 mg total) by mouth daily. 90 capsule 3   traMADol (ULTRAM) 50 MG tablet Take 50 mg by mouth every 6 (six) hours as needed.     triamcinolone cream (KENALOG) 0.1 % APPLY  CREAM EXTERNALLY TWICE DAILY 450 g 3   No current facility-administered medications for this encounter.    REVIEW OF SYSTEMS:  On review of systems, the patient reports that he is doing well overall. He denies any chest pain, shortness of breath, cough, fevers, chills, night sweats. He reports weight loss of 40 lbs over the last 1-2 years. He denies any bowel disturbances, and denies abdominal pain, nausea or vomiting. He denies any new musculoskeletal or joint aches or pains. His IPSS was 2, indicating mild urinary symptoms on Terazosin and Detrol LA. He refused to complete SHIM evaluation. A complete review of systems is obtained and is otherwise negative.    PHYSICAL EXAM:  Wt Readings from Last 3 Encounters:  05/19/22 175 lb 8 oz (79.6 kg)  03/15/22 153 lb 12.8 oz (69.8 kg)  12/12/21 173 lb (78.5 kg)   Temp Readings from Last 3 Encounters:  05/19/22 (!) 97.5 F (36.4 C) (Oral)  03/15/22 97.7  F (36.5 C) (Oral)  12/12/21 98.6 F (37 C) (Oral)   BP Readings from Last 3 Encounters:  05/19/22 (!) 152/87  03/15/22 108/68  12/12/21 102/70   Pulse Readings from Last 3 Encounters:  05/19/22 (!) 104  03/15/22 (!) 108  12/12/21 (!) 109   Pain Assessment Pain Score: 5  Pain Loc: Shoulder/10  In general this is a well appearing African American male in no acute distress. He's alert and oriented x4 and appropriate throughout the examination. Cardiopulmonary assessment is negative for acute distress, and he exhibits normal effort.     KPS = 90  100 - Normal; no complaints; no evidence of disease. 90   - Able to carry on normal activity; minor signs or symptoms of disease. 80   - Normal activity with effort; some signs or symptoms of disease. 68   - Cares for self; unable to carry on normal activity or to do active work. 60   - Requires occasional assistance, but is able to care for most of his personal needs. 50   - Requires considerable assistance and frequent medical care. 40   - Disabled; requires special care and assistance. 30   - Severely disabled; hospital admission is indicated although death not imminent. 20   - Very sick; hospital admission necessary; active supportive treatment necessary. 10   - Moribund; fatal processes progressing rapidly. 0     - Dead  Karnofsky DA, Abelmann WH, Craver LS and Burchenal [facility] [phone]) The use of the nitrogen mustards in the palliative treatment of carcinoma: with particular reference to bronchogenic carcinoma Cancer 1 634-56  LABORATORY DATA:  Lab Results  Component Value Date   WBC 5.0 03/15/2022   HGB 13.5 03/15/2022   HCT 40.7 03/15/2022   MCV 89.9 03/15/2022   PLT 281.0 03/15/2022   Lab Results  Component Value Date   NA 144 03/15/2022   K 3.9 03/15/2022   CL 104 03/15/2022   CO2 32 03/15/2022   Lab  Results  Component Value Date   ALT 51 03/15/2022   AST 29 03/15/2022   ALKPHOS 77 03/15/2022   BILITOT 0.5  03/15/2022     RADIOGRAPHY: NM PET (PSMA) SKULL TO MID THIGH  Addendum Date: 05/02/2022   ADDENDUM REPORT: 05/02/2022 13:53 ADDENDUM: Unit correction: Prostate diameter equal 72 mm. Electronically Signed   By: Genevive Bi M.D.   On: 05/02/2022 13:53   Result Date: 05/02/2022 CLINICAL DATA:  76 old male with prostate carcinoma. Recent biopsy. PSA equal 43. EXAM: NUCLEAR MEDICINE PET SKULL BASE TO THIGH TECHNIQUE: 9.0 mCi F18 Piflufolastat (Pylarify) was injected intravenously. Full-ring PET imaging was performed from the skull base to thigh after the radiotracer. CT data was obtained and used for attenuation correction and anatomic localization. COMPARISON:  None Available. FINDINGS: NECK No radiotracer activity in neck lymph nodes. Incidental CT finding: 2.6 cm partially cystic lesion in the RIGHT lobe of the thyroid gland. CHEST No radiotracer accumulation within mediastinal or hilar lymph nodes. 12 mm LEFT lobe pulmonary nodule (image 89) does not have radiotracer activity. Incidental CT finding: None. ABDOMEN/PELVIS Prostate: Mild tracer activity diffusely within the prostate gland with SUV max equal 3.9 (image 186) prostate gland is enlarged measuring 72 cm diameter. Lymph nodes: No abnormal radiotracer accumulation within pelvic or abdominal nodes. Liver: No evidence of liver metastasis. Intense physiologic radiotracer activity associated with the bowel Incidental CT finding: None. SKELETON No focal activity to suggest skeletal metastasis. IMPRESSION: 1. Mild to moderate diffuse radiotracer activity within enlarged prostate gland is nonspecific. 2. No metastatic adenopathy in the pelvis or periaortic retroperitoneum. 3. No visceral metastasis or skeletal metastasis. 4. Large LEFT lobe pulmonary nodule. No PSMA radiotracer activity. Consider one of the following in 3 months for both low-risk and high-risk individuals: (a) repeat chest CT, (b) follow-up PET-CT, or (c) tissue sampling. This  recommendation follows the consensus statement: Guidelines for Management of Incidental Pulmonary Nodules Detected on CT Images: From the Fleischner Society 2017; Radiology 2017; 284:228-243. 5. Recommend thyroid US (ref: J Am Coll Radiol. 2015 Feb;12(2): 143-50). These results will be called to the ordering clinician or representative by the Radiologist Assistant, and communication documented in the PACS or [employer]. Electronically Signed: By: Genevive Bi M.D. On: 05/02/2022 12:08      IMPRESSION/PLAN: 1. 81 y.o. gentleman with Stage T1c adenocarcinoma of the prostate with Gleason Score of 3+4, and PSA of 43. We discussed the patient's workup and outlined the nature of prostate cancer in this setting. The patient's T stage, Gleason's score, and PSA put him into the unfavorable intermediate-high risk group. Accordingly, he is eligible for ADT concurrent with 5.5-8 weeks of external radiation. He is not a surgical candidate give his advanced age. Therefore, we discussed the available radiation techniques, and focused on the details and logistics of delivery. We discussed and outlined the risks, benefits, short and long-term effects associated with radiotherapy and compared and contrasted these with prostatectomy. We discussed the role of SpaceOAR gel in reducing the rectal toxicity associated with radiotherapy. We also detailed the role of ADT in the treatment of unfavorable intermediate-high risk prostate cancer and outlined the associated side effects that could be expected with this therapy. He was encouraged to ask questions that were answered to his stated satisfaction.  At the conclusion of our conversation, the patient is interested in moving forward with 5.5 weeks of external beam therapy concurrent with ADT. We will share our discussion with Dr. Mena Goes and make arrangements for a follow  up visit, first available, to start ADT now. He will also need fiducial markers and SpaceOAR gel  placement, prior to simulation in December 2023, to reduce rectal toxicity from radiotherapy. The patient appears to have a good understanding of his disease and our treatment recommendations which are of curative intent and is in agreement with the stated plan.  Therefore, we will move forward with treatment planning accordingly, in anticipation of beginning IMRT approximately 2 months after starting ADT.  We personally spent 70 minutes in this encounter including chart review, reviewing radiological studies, meeting face-to-face with the patient, entering orders and completing documentation.    Marguarite Arbour, PA-C    Margaretmary Dys, MD  [facility]  Radiation Oncology Direct Dial: [phone]  Fax: [phone] [email or web address]  Skype  LinkedIn   This document serves as a record of services personally performed by Margaretmary Dys, MD and Marcello Fennel, PA-C. It was created on their behalf by Mickie Bail, a trained medical scribe. The creation of this record is based on the scribe's personal observations and the provider's statements to them. This document has been checked and approved by the attending provider.

## 2022-05-22 DIAGNOSIS — C61 Malignant neoplasm of prostate: Secondary | ICD-10-CM | POA: Insufficient documentation

## 2022-05-29 ENCOUNTER — Other Ambulatory Visit: Payer: Self-pay | Admitting: Urology

## 2022-05-29 DIAGNOSIS — E041 Nontoxic single thyroid nodule: Secondary | ICD-10-CM

## 2022-06-15 ENCOUNTER — Ambulatory Visit: Payer: Medicare Other | Admitting: Internal Medicine

## 2022-06-15 ENCOUNTER — Encounter: Payer: Self-pay | Admitting: Internal Medicine

## 2022-06-15 VITALS — BP 130/82 | HR 87 | Temp 98.0°F | Ht 74.0 in | Wt 181.6 lb

## 2022-06-15 DIAGNOSIS — I10 Essential (primary) hypertension: Secondary | ICD-10-CM | POA: Diagnosis not present

## 2022-06-15 DIAGNOSIS — M25511 Pain in right shoulder: Secondary | ICD-10-CM | POA: Diagnosis not present

## 2022-06-15 DIAGNOSIS — C61 Malignant neoplasm of prostate: Secondary | ICD-10-CM | POA: Diagnosis not present

## 2022-06-15 DIAGNOSIS — M25512 Pain in left shoulder: Secondary | ICD-10-CM

## 2022-06-15 DIAGNOSIS — R7309 Other abnormal glucose: Secondary | ICD-10-CM

## 2022-06-15 DIAGNOSIS — M25519 Pain in unspecified shoulder: Secondary | ICD-10-CM | POA: Insufficient documentation

## 2022-06-15 MED ORDER — MELOXICAM 15 MG PO TABS: 15.0000 mg | ORAL_TABLET | Freq: Every day | ORAL | 1 refills | Status: DC | PRN

## 2022-06-15 MED ORDER — TRAMADOL HCL 50 MG PO TABS: 50.0000 mg | ORAL_TABLET | Freq: Four times a day (QID) | ORAL | 1 refills | Status: DC | PRN

## 2022-06-15 NOTE — Progress Notes (Signed)
Subjective:  Patient ID: Brian Mora, male    DOB: Nov 10, 1940  Age: 81 y.o. MRN: [id number]  CC: Follow-up (3 month f/u) and Medication Refill (Req refill on Tramadol)   HPI TAKEO HARTS presents for prostate cancer.  The pt is upset about his prostate bx, PET scan etc. He does not want to get treated or be seen for prostate cancer at the moment. I explained Mr Haslam what I could about his test and about the process... C/o B shoulder pain started after laying for the PET scan w/his hands up  Outpatient Medications Prior to Visit  Medication Sig Dispense Refill   docusate sodium (STOOL SOFTENER) 100 MG capsule Take 100 mg by mouth daily as needed for mild constipation.     Multiple Vitamins-Minerals (CENTRUM SILVER ULTRA MENS PO) Take 1 tablet by mouth daily.     naproxen sodium (ALEVE) 220 MG tablet Take 220 mg by mouth 2 (two) times daily as needed.     traMADol (ULTRAM) 50 MG tablet Take 50 mg by mouth every 6 (six) hours as needed.     acetaminophen (TYLENOL) 325 MG tablet Take 2 tablets (650 mg total) by mouth every 6 (six) hours as needed for mild pain (or Fever >/= 101). (Patient not taking: Reported on 06/15/2022)     ALPRAZolam (XANAX) 0.5 MG tablet Take 1 tablet (0.5 mg total) by mouth at bedtime as needed for anxiety. Insomnia (Patient not taking: Reported on 06/15/2022) 30 tablet 3   aspirin EC 325 MG EC tablet 1 tab a day for the next 30 days to prevent blood clots (Patient not taking: Reported on 06/15/2022) 30 tablet 0   Cholecalciferol (VITAMIN D3) 2000 units capsule Take 1 capsule (2,000 Units total) by mouth daily. (Patient not taking: Reported on 06/15/2022) 100 capsule 3   docusate sodium (COLACE) 100 MG capsule Take 1 capsule (100 mg total) by mouth 2 (two) times daily. (Patient not taking: Reported on 06/15/2022) 10 capsule 0   esomeprazole (NEXIUM) 40 MG capsule TAKE 1 CAPSULE BY MOUTH EVERY DAY (Patient not taking: Reported on 06/15/2022) 90 capsule 3    loratadine (CLARITIN) 10 MG tablet Take 10 mg by mouth daily as needed for allergies. (Patient not taking: Reported on 06/15/2022)     meloxicam (MOBIC) 7.5 MG tablet TAKE 1 TABLET 1-2 TIMES DAILY AS NEEDED FOR ARTHRITIS (Patient not taking: Reported on 06/15/2022) 60 tablet 3   OVER THE COUNTER MEDICATION 1 tablet. OTC antacid from [facility] (Patient not taking: Reported on 06/15/2022)     polyethylene glycol (MIRALAX / GLYCOLAX) packet Take 17 g by mouth 2 (two) times daily. (Patient not taking: Reported on 06/15/2022) 60 each 0   tolterodine (DETROL LA) 4 MG 24 hr capsule Take 1 capsule (4 mg total) by mouth daily. (Patient not taking: Reported on 06/15/2022) 90 capsule 3   triamcinolone cream (KENALOG) 0.1 % APPLY  CREAM EXTERNALLY TWICE DAILY (Patient not taking: Reported on 06/15/2022) 450 g 3   No facility-administered medications prior to visit.    ROS: Review of Systems  Constitutional:  Negative for appetite change, fatigue and unexpected weight change.  HENT:  Negative for congestion, nosebleeds, sneezing, sore throat and trouble swallowing.   Eyes:  Negative for itching and visual disturbance.  Respiratory:  Negative for cough.   Cardiovascular:  Negative for chest pain, palpitations and leg swelling.  Gastrointestinal:  Negative for abdominal distention, blood in stool, diarrhea and nausea.  Genitourinary:  Negative for frequency  and hematuria.  Musculoskeletal:  Negative for back pain, gait problem, joint swelling and neck pain.  Skin:  Negative for rash.  Neurological:  Negative for dizziness, tremors, speech difficulty and weakness.  Psychiatric/Behavioral:  Negative for agitation, dysphoric mood and sleep disturbance. The patient is not nervous/anxious.     Objective:  BP 130/82 (BP Location: Left Arm)   Pulse 87   Temp 98 F (36.7 C) (Oral)   Ht '6\' 2"'$  (1.88 m)   Wt 181 lb 9.6 oz (82.4 kg)   SpO2 98%   BMI 23.32 kg/m   BP Readings from Last 3 Encounters:  06/15/22 130/82   05/19/22 (!) 152/87  03/15/22 108/68    Wt Readings from Last 3 Encounters:  06/15/22 181 lb 9.6 oz (82.4 kg)  05/19/22 175 lb 8 oz (79.6 kg)  03/15/22 153 lb 12.8 oz (69.8 kg)    Physical Exam Constitutional:      General: He is not in acute distress.    Appearance: Normal appearance. He is well-developed.     Comments: NAD  Eyes:     Conjunctiva/sclera: Conjunctivae normal.     Pupils: Pupils are equal, round, and reactive to light.  Neck:     Thyroid: No thyromegaly.     Vascular: No JVD.  Cardiovascular:     Rate and Rhythm: Normal rate and regular rhythm.     Heart sounds: Normal heart sounds. No murmur heard.    No friction rub. No gallop.  Pulmonary:     Effort: Pulmonary effort is normal. No respiratory distress.     Breath sounds: Normal breath sounds. No wheezing or rales.  Chest:     Chest wall: No tenderness.  Abdominal:     General: Bowel sounds are normal. There is no distension.     Palpations: Abdomen is soft. There is no mass.     Tenderness: There is no abdominal tenderness. There is no guarding or rebound.  Musculoskeletal:        General: No tenderness. Normal range of motion.     Cervical back: Normal range of motion.  Lymphadenopathy:     Cervical: No cervical adenopathy.  Skin:    General: Skin is warm and dry.     Findings: No rash.  Neurological:     Mental Status: He is alert and oriented to person, place, and time.     Cranial Nerves: No cranial nerve deficit.     Motor: No abnormal muscle tone.     Coordination: Coordination normal.     Gait: Gait normal.     Deep Tendon Reflexes: Reflexes are normal and symmetric.  Psychiatric:        Behavior: Behavior normal.        Thought Content: Thought content normal.        Judgment: Judgment normal.     Lab Results  Component Value Date   WBC 5.0 03/15/2022   HGB 13.5 03/15/2022   HCT 40.7 03/15/2022   PLT 281.0 03/15/2022   GLUCOSE 97 03/15/2022   CHOL 230 (H) 08/11/2021   TRIG  145.0 08/11/2021   HDL 56.80 08/11/2021   LDLDIRECT 164.0 08/27/2020   LDLCALC 144 (H) 08/11/2021   ALT 51 03/15/2022   AST 29 03/15/2022   NA 144 03/15/2022   K 3.9 03/15/2022   CL 104 03/15/2022   CREATININE 0.98 03/15/2022   BUN 10 03/15/2022   CO2 32 03/15/2022   TSH 0.98 03/15/2022   PSA 36.01 (H) 08/11/2021  INR 1.02 10/29/2014   HGBA1C 6.8 (H) 03/15/2022   MICROALBUR 0.7 08/27/2020    No results found.  Assessment & Plan:   Problem List Items Addressed This Visit     HTN (hypertension)    Cont w/Hytrin      Malignant neoplasm of prostate ([place]) - Primary    The pt is upset about his prostate bx, PET scan etc. He does not want to get treated or be seen for prostate cancer at the moment. I explained Mr Clemons what I could about his test and about the process...         No orders of the defined types were placed in this encounter.     Follow-up: Return in about 3 months (around 09/15/2022) for a follow-up visit.  Walker Kehr, MD

## 2022-06-15 NOTE — Assessment & Plan Note (Signed)
Cont w/Hytrin

## 2022-06-15 NOTE — Assessment & Plan Note (Signed)
The pt is upset about his prostate bx, PET scan etc. He does not want to get treated or be seen for prostate cancer at the moment. I explained Brian Mora what I could about his test and about the process.Marland KitchenMarland Kitchen

## 2022-06-15 NOTE — Assessment & Plan Note (Signed)
B shoulder pain started after laying for the PET scan w/his hands up 11/23 Tramadol prn Meloxicam prn

## 2022-06-28 ENCOUNTER — Telehealth: Payer: Self-pay | Admitting: *Deleted

## 2022-06-28 NOTE — Telephone Encounter (Signed)
CALLED PATIENT'S DAUGHTER- MS. OWENS TO INFORM OF ADT APPT. FOR 07-03-22 - ARRIVAL TIME- 2:45 PM @ DR. Lyndal Rainbow OFFICE, SPOKE WITH PATIENT'S DAUGHTER MS. OWENS AND SHE STATED THAT HER DAD DOESN'T WANT TO DO ANYTHING RIGHT NOW, NOTIFIED DR. MANNING

## 2022-08-10 ENCOUNTER — Other Ambulatory Visit: Payer: Self-pay | Admitting: Internal Medicine

## 2022-09-18 ENCOUNTER — Ambulatory Visit: Payer: Medicare Other | Admitting: Internal Medicine

## 2023-01-02 ENCOUNTER — Encounter: Payer: Self-pay | Admitting: Podiatry

## 2023-01-02 ENCOUNTER — Other Ambulatory Visit: Payer: Self-pay | Admitting: Internal Medicine

## 2023-01-02 ENCOUNTER — Ambulatory Visit: Payer: Medicare Other | Admitting: Podiatry

## 2023-01-02 DIAGNOSIS — B351 Tinea unguium: Secondary | ICD-10-CM

## 2023-01-02 DIAGNOSIS — M79675 Pain in left toe(s): Secondary | ICD-10-CM

## 2023-01-02 DIAGNOSIS — M79674 Pain in right toe(s): Secondary | ICD-10-CM

## 2023-01-02 NOTE — Progress Notes (Signed)
  Subjective:  Patient ID: Brian Mora, male    DOB: 01/29/1941,   MRN: [id number]  Chief Complaint  Patient presents with   Nail Problem    Routine foot care    82 y.o. male presents for concern of thickened elongated and painful nails that are difficult to trim. Requesting to have them trimmed today.   PCP:  Plotnikov, [name], MD    . Denies any other pedal complaints. Denies n/v/f/c.   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 CFT <3 seconds. Absent hair growth on digits. Edema noted to bilateral lower extremities. Xerosis noted bilaterally.  Skin. No lacerations or abrasions bilateral feet. Nails 1-5 bilateral  are thickened discolored and elongated with subungual debris.  Musculoskeletal: MMT 5/5 bilateral lower extremities in DF, PF, Inversion and Eversion. Deceased ROM in DF of ankle joint.  Neurological: Sensation intact to light touch. Protective sensation intact bilateral.    Assessment:   1. Pain due to onychomycosis of toenails of both feet      Plan:  Patient was evaluated and treated and all questions answered. -Mechanically debrided all nails 1-5 bilateral using sterile nail nipper and filed with dremel without incident  -Answered all patient questions -Patient to return  in 3 months for at risk foot care -Patient advised to call the office if any problems or questions arise in the meantime.   Louann Sjogren, DPM

## 2023-04-04 ENCOUNTER — Encounter: Payer: Self-pay | Admitting: Podiatry

## 2023-04-04 ENCOUNTER — Ambulatory Visit: Payer: Medicare Other | Admitting: Podiatry

## 2023-04-04 DIAGNOSIS — B351 Tinea unguium: Secondary | ICD-10-CM | POA: Diagnosis not present

## 2023-04-04 DIAGNOSIS — M79675 Pain in left toe(s): Secondary | ICD-10-CM | POA: Diagnosis not present

## 2023-04-04 DIAGNOSIS — M79674 Pain in right toe(s): Secondary | ICD-10-CM

## 2023-04-04 NOTE — Progress Notes (Signed)
  Subjective:  Patient ID: Brian Mora, male    DOB: 04-Mar-1941,   MRN: [id number]  Chief Complaint  Patient presents with   Nail Problem    Pt present toda for a routine foot care. Pt denies pain    82 y.o. male presents for concern of thickened elongated and painful nails that are difficult to trim. Requesting to have them trimmed today.   PCP:  Plotnikov, [name], MD    . Denies any other pedal complaints. Denies n/v/f/c.   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 CFT <3 seconds. Absent hair growth on digits. Edema noted to bilateral lower extremities. Xerosis noted bilaterally.  Skin. No lacerations or abrasions bilateral feet. Nails 1-5 bilateral  are thickened discolored and elongated with subungual debris.  Musculoskeletal: MMT 5/5 bilateral lower extremities in DF, PF, Inversion and Eversion. Deceased ROM in DF of ankle joint.  Neurological: Sensation intact to light touch. Protective sensation intact bilateral.    Assessment:   1. Pain due to onychomycosis of toenails of both feet      Plan:  Patient was evaluated and treated and all questions answered. -Mechanically debrided all nails 1-5 bilateral using sterile nail nipper and filed with dremel without incident  -Answered all patient questions -Patient to return  in 3 months for at risk foot care -Patient advised to call the office if any problems or questions arise in the meantime.   Louann Sjogren, DPM

## 2023-04-05 ENCOUNTER — Ambulatory Visit: Payer: Medicare Other | Admitting: Internal Medicine

## 2023-04-05 ENCOUNTER — Encounter: Payer: Self-pay | Admitting: Internal Medicine

## 2023-04-05 VITALS — BP 130/82 | HR 92 | Temp 98.6°F | Ht 74.0 in | Wt 190.0 lb

## 2023-04-05 DIAGNOSIS — R972 Elevated prostate specific antigen [PSA]: Secondary | ICD-10-CM

## 2023-04-05 DIAGNOSIS — I10 Essential (primary) hypertension: Secondary | ICD-10-CM | POA: Diagnosis not present

## 2023-04-05 DIAGNOSIS — R634 Abnormal weight loss: Secondary | ICD-10-CM

## 2023-04-05 DIAGNOSIS — R911 Solitary pulmonary nodule: Secondary | ICD-10-CM | POA: Diagnosis not present

## 2023-04-05 DIAGNOSIS — C61 Malignant neoplasm of prostate: Secondary | ICD-10-CM | POA: Diagnosis not present

## 2023-04-05 DIAGNOSIS — M1712 Unilateral primary osteoarthritis, left knee: Secondary | ICD-10-CM

## 2023-04-05 MED ORDER — TRAMADOL HCL 50 MG PO TABS: 50.0000 mg | ORAL_TABLET | Freq: Four times a day (QID) | ORAL | 1 refills | Status: DC | PRN

## 2023-04-05 MED ORDER — VITAMIN D3 50 MCG (2000 UT) PO CAPS: 2000.0000 [IU] | ORAL_CAPSULE | Freq: Every day | ORAL | Status: AC

## 2023-04-05 NOTE — Assessment & Plan Note (Addendum)
The pt is upset about his prostate bx, PET scan etc. He does not want to get treated or be seen for prostate cancer at the moment. I explained Brian Mora what I could about his test and about the process... 2023 PET - no mets Gleason 3+4=7 Pt refused to f/u on cancer w/any tests F/u w/Dr Mena Goes suggested- Brian Mora refused to go back.  He is unhappy about the pain she had w/prostate bx I suggested to continue to monitor PSA - pt refused

## 2023-04-05 NOTE — Assessment & Plan Note (Signed)
Wt Readings from Last 3 Encounters:  04/05/23 190 lb (86.2 kg)  06/15/22 181 lb 9.6 oz (82.4 kg)  05/19/22 175 lb 8 oz (79.6 kg)

## 2023-04-05 NOTE — Assessment & Plan Note (Addendum)
  2023 PET  LLL 12 mm nodule Pt refused to f/u on the nodule w/any tests.  He understands risks

## 2023-04-05 NOTE — Progress Notes (Signed)
Subjective:  Patient ID: Brian Mora, male    DOB: [birth year]  Age: 82 y.o. MRN: [id number]  CC: Follow-up   HPI ANEES MASTEN presents for wt loss, OA, prostate cancer  Outpatient Medications Prior to Visit  Medication Sig Dispense Refill   docusate sodium (STOOL SOFTENER) 100 MG capsule Take 100 mg by mouth daily as needed for mild constipation.     Multiple Vitamins-Minerals (CENTRUM SILVER ULTRA MENS PO) Take 1 tablet by mouth daily.     meloxicam (MOBIC) 15 MG tablet TAKE 1 TABLET BY MOUTH EVERY DAY AS NEEDED FOR PAIN 30 tablet 1   traMADol (ULTRAM) 50 MG tablet Take 1 tablet (50 mg total) by mouth every 6 (six) hours as needed. 100 tablet 1   No facility-administered medications prior to visit.    ROS: Review of Systems  Constitutional:  Negative for appetite change, fatigue and unexpected weight change.  HENT:  Negative for congestion, nosebleeds, sneezing, sore throat and trouble swallowing.   Eyes:  Negative for itching and visual disturbance.  Respiratory:  Negative for cough.   Cardiovascular:  Negative for chest pain, palpitations and leg swelling.  Gastrointestinal:  Negative for abdominal distention, blood in stool, diarrhea and nausea.  Genitourinary:  Negative for frequency and hematuria.  Musculoskeletal:  Negative for back pain, gait problem, joint swelling and neck pain.  Skin:  Negative for rash.  Neurological:  Negative for dizziness, tremors, speech difficulty and weakness.  Psychiatric/Behavioral:  Negative for agitation, dysphoric mood and sleep disturbance. The patient is not nervous/anxious.     Objective:  BP 130/82 (BP Location: Left Arm, Patient Position: Sitting, Cuff Size: Large)   Pulse 92   Temp 98.6 F (37 C) (Oral)   Ht 6\' 2"  (1.88 m)   Wt 190 lb (86.2 kg)   SpO2 96%   BMI 24.39 kg/m   BP Readings from Last 3 Encounters:  04/05/23 130/82  06/15/22 130/82  05/19/22 (!) 152/87    Wt Readings from Last 3 Encounters:   04/05/23 190 lb (86.2 kg)  06/15/22 181 lb 9.6 oz (82.4 kg)  05/19/22 175 lb 8 oz (79.6 kg)    Physical Exam Constitutional:      General: He is not in acute distress.    Appearance: He is well-developed.     Comments: NAD  Eyes:     Conjunctiva/sclera: Conjunctivae normal.     Pupils: Pupils are equal, round, and reactive to light.  Neck:     Thyroid: No thyromegaly.     Vascular: No JVD.  Cardiovascular:     Rate and Rhythm: Normal rate and regular rhythm.     Heart sounds: Normal heart sounds. No murmur heard.    No friction rub. No gallop.  Pulmonary:     Effort: Pulmonary effort is normal. No respiratory distress.     Breath sounds: Normal breath sounds. No wheezing or rales.  Chest:     Chest wall: No tenderness.  Abdominal:     General: Bowel sounds are normal. There is no distension.     Palpations: Abdomen is soft. There is no mass.     Tenderness: There is no abdominal tenderness. There is no guarding or rebound.  Musculoskeletal:        General: No tenderness. Normal range of motion.     Cervical back: Normal range of motion.  Lymphadenopathy:     Cervical: No cervical adenopathy.  Skin:    General: Skin is warm and dry.  Findings: No rash.  Neurological:     Mental Status: He is alert and oriented to person, place, and time.     Cranial Nerves: No cranial nerve deficit.     Motor: No abnormal muscle tone.     Coordination: Coordination normal.     Gait: Gait normal.     Deep Tendon Reflexes: Reflexes are normal and symmetric.  Psychiatric:        Behavior: Behavior normal.        Thought Content: Thought content normal.        Judgment: Judgment normal.     Lab Results  Component Value Date   WBC 5.0 03/15/2022   HGB 13.5 03/15/2022   HCT 40.7 03/15/2022   PLT 281.0 03/15/2022   GLUCOSE 97 03/15/2022   CHOL 230 (H) 08/11/2021   TRIG 145.0 08/11/2021   HDL 56.80 08/11/2021   LDLDIRECT 164.0 08/27/2020   LDLCALC 144 (H) 08/11/2021   ALT 51  03/15/2022   AST 29 03/15/2022   NA 144 03/15/2022   K 3.9 03/15/2022   CL 104 03/15/2022   CREATININE 0.98 03/15/2022   BUN 10 03/15/2022   CO2 32 03/15/2022   TSH 0.98 03/15/2022   PSA 36.01 (H) 08/11/2021   INR 1.02 10/29/2014   HGBA1C 6.8 (H) 03/15/2022   MICROALBUR 0.7 08/27/2020    No results found.  Assessment & Plan:   Problem List Items Addressed This Visit     HTN (hypertension) - Primary    Wt Readings from Last 3 Encounters:  04/05/23 190 lb (86.2 kg)  06/15/22 181 lb 9.6 oz (82.4 kg)  05/19/22 175 lb 8 oz (79.6 kg)         Relevant Orders   Comprehensive metabolic panel   Hemoglobin A1c   TSH   Urinalysis   CBC with Differential/Platelet   Primary localized osteoarthritis of left knee    Tramadol as needed  Potential benefits of a long term opioids use as well as potential risks (i.e. addiction risk, apnea etc) and complications (i.e. Somnolence, constipation and others) were explained to the patient and were aknowledged.       Relevant Medications   traMADol (ULTRAM) 50 MG tablet   Elevated PSA    F/u w/Dr Mena Goes Fayrene Fearing refused to go back.  He is unhappy about the pain she had w/prostate bx I suggested to continue to monitor PSA - pt refused      Weight loss    Resolved      Relevant Orders   Comprehensive metabolic panel   Hemoglobin A1c   TSH   Urinalysis   CBC with Differential/Platelet   Prostate cancer [facility])    The pt is upset about his prostate bx, PET scan etc. He does not want to get treated or be seen for prostate cancer at the moment. I explained Mr Heckman what I could about his test and about the process... 2023 PET - no mets Gleason 3+4=7 Pt refused to f/u on cancer w/any tests F/u w/Dr Mena Goes suggested- Landan refused to go back.  He is unhappy about the pain she had w/prostate bx I suggested to continue to monitor PSA - pt refused      Relevant Orders   Comprehensive metabolic panel   Hemoglobin A1c   TSH    Urinalysis   CBC with Differential/Platelet   Pulmonary nodule     2023 PET  LLL 12 mm nodule Pt refused to f/u on the nodule w/any  tests.  He understands risks         Meds ordered this encounter  Medications   Cholecalciferol (VITAMIN D3) 50 MCG (2000 UT) capsule    Sig: Take 1 capsule (2,000 Units total) by mouth daily.   traMADol (ULTRAM) 50 MG tablet    Sig: Take 1 tablet (50 mg total) by mouth every 6 (six) hours as needed.    Dispense:  100 tablet    Refill:  1      Follow-up: Return in about 6 months (around 10/05/2023) for a follow-up visit.  Sonda Primes, MD

## 2023-04-05 NOTE — Assessment & Plan Note (Signed)
Resolved

## 2023-04-05 NOTE — Assessment & Plan Note (Addendum)
F/u w/Dr Mena Goes Fayrene Fearing refused to go back.  He is unhappy about the pain she had w/prostate bx I suggested to continue to monitor PSA - pt refused

## 2023-04-12 ENCOUNTER — Encounter: Payer: Self-pay | Admitting: Internal Medicine

## 2023-04-12 NOTE — Assessment & Plan Note (Signed)
Tramadol as needed  Potential benefits of a long term opioids use as well as potential risks (i.e. addiction risk, apnea etc) and complications (i.e. Somnolence, constipation and others) were explained to the patient and were aknowledged.

## 2023-07-09 ENCOUNTER — Ambulatory Visit: Payer: Medicare Other | Admitting: Podiatry

## 2023-07-09 ENCOUNTER — Encounter: Payer: Self-pay | Admitting: Podiatry

## 2023-07-09 DIAGNOSIS — M79674 Pain in right toe(s): Secondary | ICD-10-CM | POA: Diagnosis not present

## 2023-07-09 DIAGNOSIS — M79675 Pain in left toe(s): Secondary | ICD-10-CM | POA: Diagnosis not present

## 2023-07-09 DIAGNOSIS — B351 Tinea unguium: Secondary | ICD-10-CM | POA: Diagnosis not present

## 2023-07-09 NOTE — Progress Notes (Signed)
  Subjective:  Patient ID: Brian Mora, male    DOB: 02/15/1941,   MRN: [id number]  Chief Complaint  Patient presents with   Nail Problem    RFC.    82 y.o. male presents for concern of thickened elongated and painful nails that are difficult to trim. Requesting to have them trimmed today.   PCP:  Plotnikov, [name], MD    . Denies any other pedal complaints. Denies n/v/f/c.   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 CFT <3 seconds. Absent hair growth on digits. Edema noted to bilateral lower extremities. Xerosis noted bilaterally.  Skin. No lacerations or abrasions bilateral feet. Nails 1-5 bilateral  are thickened discolored and elongated with subungual debris.  Musculoskeletal: MMT 5/5 bilateral lower extremities in DF, PF, Inversion and Eversion. Deceased ROM in DF of ankle joint.  Neurological: Sensation intact to light touch. Protective sensation intact bilateral.    Assessment:   1. Pain due to onychomycosis of toenails of both feet      Plan:  Patient was evaluated and treated and all questions answered. -Mechanically debrided all nails 1-5 bilateral using sterile nail nipper and filed with dremel without incident  -Answered all patient questions -Patient to return  in 3 months for at risk foot care -Patient advised to call the office if any problems or questions arise in the meantime.   Louann Sjogren, DPM

## 2023-10-08 ENCOUNTER — Ambulatory Visit: Payer: Medicare Other | Admitting: Internal Medicine

## 2023-10-09 ENCOUNTER — Encounter: Payer: Self-pay | Admitting: Podiatry

## 2023-10-09 ENCOUNTER — Ambulatory Visit: Payer: Medicare Other | Admitting: Podiatry

## 2023-10-09 DIAGNOSIS — B351 Tinea unguium: Secondary | ICD-10-CM | POA: Diagnosis not present

## 2023-10-09 DIAGNOSIS — M79674 Pain in right toe(s): Secondary | ICD-10-CM | POA: Diagnosis not present

## 2023-10-09 DIAGNOSIS — M79675 Pain in left toe(s): Secondary | ICD-10-CM

## 2023-10-09 DIAGNOSIS — B353 Tinea pedis: Secondary | ICD-10-CM

## 2023-10-09 MED ORDER — KETOCONAZOLE 2 % EX CREA: 1.0000 | TOPICAL_CREAM | Freq: Every day | CUTANEOUS | 2 refills | Status: AC

## 2023-10-09 NOTE — Progress Notes (Signed)
  Subjective:  Patient ID: Brian Mora, male    DOB: 08/17/1940,   MRN: [id number]  No chief complaint on file.   83 y.o. male presents for concern of thickened elongated and painful nails that are difficult to trim. Requesting to have them trimmed today.   PCP:  Plotnikov, [name], MD    . Denies any other pedal complaints. Denies n/v/f/c.   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 CFT <3 seconds. Absent hair growth on digits. Edema noted to bilateral lower extremities. Xerosis noted bilaterally.  Skin. No lacerations or abrasions bilateral feet. Nails 1-5 bilateral  are thickened discolored and elongated with subungual debris. Mild scaling noted to plantar right hallux  Musculoskeletal: MMT 5/5 bilateral lower extremities in DF, PF, Inversion and Eversion. Deceased ROM in DF of ankle joint.  Neurological: Sensation intact to light touch. Protective sensation intact bilateral.    Assessment:   1. Pain due to onychomycosis of toenails of both feet      Plan:  Patient was evaluated and treated and all questions answered. -Mechanically debrided all nails 1-5 bilateral using sterile nail nipper and filed with dremel without incident  -Answered all patient questions -Ketoconazole sent for possible tinea pedis -Patient to return  in 3 months for at risk foot care -Patient advised to call the office if any problems or questions arise in the meantime.   Louann Sjogren, DPM

## 2023-10-12 ENCOUNTER — Encounter: Payer: Self-pay | Admitting: Internal Medicine

## 2023-10-12 ENCOUNTER — Ambulatory Visit: Payer: Medicare Other | Admitting: Internal Medicine

## 2023-10-12 VITALS — BP 110/70 | HR 103 | Temp 98.0°F | Ht 74.0 in | Wt 195.0 lb

## 2023-10-12 DIAGNOSIS — I10 Essential (primary) hypertension: Secondary | ICD-10-CM

## 2023-10-12 DIAGNOSIS — C61 Malignant neoplasm of prostate: Secondary | ICD-10-CM

## 2023-10-12 DIAGNOSIS — M1991 Primary osteoarthritis, unspecified site: Secondary | ICD-10-CM

## 2023-10-12 DIAGNOSIS — R972 Elevated prostate specific antigen [PSA]: Secondary | ICD-10-CM

## 2023-10-12 DIAGNOSIS — R634 Abnormal weight loss: Secondary | ICD-10-CM

## 2023-10-12 LAB — CBC WITH DIFFERENTIAL/PLATELET
Basophils Absolute: 0 10*3/uL (ref 0.0–0.1)
Basophils Relative: 0.6 % (ref 0.0–3.0)
Eosinophils Absolute: 0.1 10*3/uL (ref 0.0–0.7)
Eosinophils Relative: 2.8 % (ref 0.0–5.0)
HCT: 42 % (ref 39.0–52.0)
Hemoglobin: 13.6 g/dL (ref 13.0–17.0)
Lymphocytes Relative: 27.2 % (ref 12.0–46.0)
Lymphs Abs: 1.5 10*3/uL (ref 0.7–4.0)
MCHC: 32.4 g/dL (ref 30.0–36.0)
MCV: 91.4 fl (ref 78.0–100.0)
Monocytes Absolute: 0.6 10*3/uL (ref 0.1–1.0)
Monocytes Relative: 10.8 % (ref 3.0–12.0)
Neutro Abs: 3.2 10*3/uL (ref 1.4–7.7)
Neutrophils Relative %: 58.6 % (ref 43.0–77.0)
Platelets: 226 10*3/uL (ref 150.0–400.0)
RBC: 4.59 Mil/uL (ref 4.22–5.81)
RDW: 14.3 % (ref 11.5–15.5)
WBC: 5.4 10*3/uL (ref 4.0–10.5)

## 2023-10-12 LAB — URINALYSIS
Bilirubin Urine: NEGATIVE
Hgb urine dipstick: NEGATIVE
Ketones, ur: NEGATIVE
Leukocytes,Ua: NEGATIVE
Nitrite: NEGATIVE
Specific Gravity, Urine: 1.02 (ref 1.000–1.030)
Total Protein, Urine: NEGATIVE
Urine Glucose: NEGATIVE
Urobilinogen, UA: 0.2 (ref 0.0–1.0)
pH: 7 (ref 5.0–8.0)

## 2023-10-12 LAB — COMPREHENSIVE METABOLIC PANEL
ALT: 17 U/L (ref 0–53)
AST: 23 U/L (ref 0–37)
Albumin: 4.2 g/dL (ref 3.5–5.2)
Alkaline Phosphatase: 74 U/L (ref 39–117)
BUN: 11 mg/dL (ref 6–23)
CO2: 28 meq/L (ref 19–32)
Calcium: 9.5 mg/dL (ref 8.4–10.5)
Chloride: 103 meq/L (ref 96–112)
Creatinine, Ser: 0.99 mg/dL (ref 0.40–1.50)
GFR: 71.06 mL/min (ref >60.00)
Glucose, Bld: 113 mg/dL — ABNORMAL HIGH (ref 70–99)
Potassium: 3.9 meq/L (ref 3.5–5.1)
Sodium: 141 meq/L (ref 135–145)
Total Bilirubin: 0.8 mg/dL (ref 0.2–1.2)
Total Protein: 7.6 g/dL (ref 6.0–8.3)

## 2023-10-12 LAB — HEMOGLOBIN A1C: Hgb A1c MFr Bld: 6.8 % — ABNORMAL HIGH (ref 4.6–6.5)

## 2023-10-12 LAB — TSH: TSH: 1.06 u[IU]/mL (ref 0.35–5.50)

## 2023-10-12 MED ORDER — TRAMADOL HCL 50 MG PO TABS: 50.0000 mg | ORAL_TABLET | Freq: Four times a day (QID) | ORAL | 2 refills | Status: DC | PRN

## 2023-10-12 NOTE — Assessment & Plan Note (Addendum)
 Wt Readings from Last 3 Encounters:  10/12/23 195 lb (88.5 kg)  04/05/23 190 lb (86.2 kg)  06/15/22 181 lb 9.6 oz (82.4 kg)  No relapse

## 2023-10-12 NOTE — Assessment & Plan Note (Addendum)
 BP Readings from Last 3 Encounters:  10/12/23 110/70  04/05/23 130/82  06/15/22 130/82    Nl BP

## 2023-10-12 NOTE — Assessment & Plan Note (Signed)
 Tramadol prn  Potential benefits of a long term opioids use as well as potential risks (i.e. addiction risk, apnea etc) and complications (i.e. Somnolence, constipation and others) were explained to the patient and were aknowledged.

## 2023-10-12 NOTE — Progress Notes (Signed)
 Subjective:  Patient ID: Brian Mora, male    DOB: November 08, 1940  Age: 83 y.o. MRN: [id number]  CC: Medical Management of Chronic Issues (6 mnth f/u)   HPI Brian Mora presents for allergies, OA - pain, prostate cancer Pt refused PSA test  Outpatient Medications Prior to Visit  Medication Sig Dispense Refill   Cholecalciferol (VITAMIN D3) 50 MCG (2000 UT) capsule Take 1 capsule (2,000 Units total) by mouth daily.     docusate sodium (STOOL SOFTENER) 100 MG capsule Take 100 mg by mouth daily as needed for mild constipation.     ketoconazole (NIZORAL) 2 % cream Apply 1 Application topically daily. 60 g 2   Multiple Vitamins-Minerals (CENTRUM SILVER ULTRA MENS PO) Take 1 tablet by mouth daily.     traMADol (ULTRAM) 50 MG tablet Take 1 tablet (50 mg total) by mouth every 6 (six) hours as needed. 100 tablet 1   No facility-administered medications prior to visit.    ROS: Review of Systems  Constitutional:  Negative for appetite change, fatigue and unexpected weight change.  HENT:  Negative for congestion, nosebleeds, sneezing, sore throat and trouble swallowing.   Eyes:  Negative for itching and visual disturbance.  Respiratory:  Negative for cough.   Cardiovascular:  Negative for chest pain, palpitations and leg swelling.  Gastrointestinal:  Negative for abdominal distention, blood in stool, diarrhea and nausea.  Genitourinary:  Negative for frequency and hematuria.  Musculoskeletal:  Positive for arthralgias. Negative for back pain, gait problem, joint swelling and neck pain.  Skin:  Negative for rash.  Neurological:  Negative for dizziness, tremors, speech difficulty and weakness.  Psychiatric/Behavioral:  Negative for agitation, dysphoric mood and sleep disturbance. The patient is not nervous/anxious.     Objective:  BP 110/70   Pulse (!) 103   Temp 98 F (36.7 C) (Oral)   Ht 6\' 2"  (1.88 m)   Wt 195 lb (88.5 kg)   SpO2 98%   BMI 25.04 kg/m   BP Readings from  Last 3 Encounters:  10/12/23 110/70  04/05/23 130/82  06/15/22 130/82    Wt Readings from Last 3 Encounters:  10/12/23 195 lb (88.5 kg)  04/05/23 190 lb (86.2 kg)  06/15/22 181 lb 9.6 oz (82.4 kg)    Physical Exam Constitutional:      General: He is not in acute distress.    Appearance: He is well-developed.     Comments: NAD  Eyes:     Conjunctiva/sclera: Conjunctivae normal.     Pupils: Pupils are equal, round, and reactive to light.  Neck:     Thyroid: No thyromegaly.     Vascular: No JVD.  Cardiovascular:     Rate and Rhythm: Normal rate and regular rhythm.     Heart sounds: Normal heart sounds. No murmur heard.    No friction rub. No gallop.  Pulmonary:     Effort: Pulmonary effort is normal. No respiratory distress.     Breath sounds: Normal breath sounds. No wheezing or rales.  Chest:     Chest wall: No tenderness.  Abdominal:     General: Bowel sounds are normal. There is no distension.     Palpations: Abdomen is soft. There is no mass.     Tenderness: There is no abdominal tenderness. There is no guarding or rebound.  Musculoskeletal:        General: Tenderness present. Normal range of motion.     Cervical back: Normal range of motion.  Lymphadenopathy:  Cervical: No cervical adenopathy.  Skin:    General: Skin is warm and dry.     Findings: No rash.  Neurological:     Mental Status: He is alert and oriented to person, place, and time.     Cranial Nerves: No cranial nerve deficit.     Motor: No abnormal muscle tone.     Coordination: Coordination normal.     Gait: Gait normal.     Deep Tendon Reflexes: Reflexes are normal and symmetric.  Psychiatric:        Behavior: Behavior normal.        Thought Content: Thought content normal.        Judgment: Judgment normal.     Lab Results  Component Value Date   WBC 5.0 03/15/2022   HGB 13.5 03/15/2022   HCT 40.7 03/15/2022   PLT 281.0 03/15/2022   GLUCOSE 97 03/15/2022   CHOL 230 (H) 08/11/2021    TRIG 145.0 08/11/2021   HDL 56.80 08/11/2021   LDLDIRECT 164.0 08/27/2020   LDLCALC 144 (H) 08/11/2021   ALT 51 03/15/2022   AST 29 03/15/2022   NA 144 03/15/2022   K 3.9 03/15/2022   CL 104 03/15/2022   CREATININE 0.98 03/15/2022   BUN 10 03/15/2022   CO2 32 03/15/2022   TSH 0.98 03/15/2022   PSA 36.01 (H) 08/11/2021   INR 1.02 10/29/2014   HGBA1C 6.8 (H) 03/15/2022   MICROALBUR 0.7 08/27/2020    No results found.  Assessment & Plan:   Problem List Items Addressed This Visit     HTN (hypertension) - Primary   BP Readings from Last 3 Encounters:  10/12/23 110/70  04/05/23 130/82  06/15/22 130/82    Nl BP      Osteoarthritis   Tramadol prn  Potential benefits of a long term opioids use as well as potential risks (i.e. addiction risk, apnea etc) and complications (i.e. Somnolence, constipation and others) were explained to the patient and were aknowledged.       Relevant Medications   traMADol (ULTRAM) 50 MG tablet   Elevated PSA   Pt refused to go back to any Urology practice for more tests/Rx Prostate cancer Gleason 3+4=7       Weight loss   Wt Readings from Last 3 Encounters:  10/12/23 195 lb (88.5 kg)  04/05/23 190 lb (86.2 kg)  06/15/22 181 lb 9.6 oz (82.4 kg)  No relapse       Prostate cancer ([facility])   Pt refused to go back to any Urology practice for more tests/Rx Prostate cancer Gleason 3+4=7  Pt refused PSA test         Meds ordered this encounter  Medications   traMADol (ULTRAM) 50 MG tablet    Sig: Take 1 tablet (50 mg total) by mouth every 6 (six) hours as needed.    Dispense:  100 tablet    Refill:  2      Follow-up: Return in about 3 months (around 01/12/2024) for a follow-up visit.  Sonda Primes, MD

## 2023-10-12 NOTE — Assessment & Plan Note (Addendum)
 Pt refused to go back to any Urology practice for more tests/Rx Prostate cancer Gleason 3+4=7  Pt refused PSA test

## 2023-10-12 NOTE — Assessment & Plan Note (Addendum)
 Pt refused to go back to any Urology practice for more tests/Rx Prostate cancer Gleason 3+4=7

## 2023-10-14 ENCOUNTER — Encounter: Payer: Self-pay | Admitting: Internal Medicine

## 2024-01-09 ENCOUNTER — Ambulatory Visit: Admitting: Podiatry

## 2024-01-09 ENCOUNTER — Encounter: Payer: Self-pay | Admitting: Podiatry

## 2024-01-09 DIAGNOSIS — B351 Tinea unguium: Secondary | ICD-10-CM

## 2024-01-09 DIAGNOSIS — M79675 Pain in left toe(s): Secondary | ICD-10-CM

## 2024-01-09 DIAGNOSIS — M79674 Pain in right toe(s): Secondary | ICD-10-CM

## 2024-01-09 NOTE — Progress Notes (Signed)
  Subjective:  Patient ID: Brian Mora, male    DOB: Aug 03, 1941,   MRN: [id number]  Chief Complaint  Patient presents with   Nail Problem    RM 23 Patient is here for routine foot and nail trimming.    83 y.o. male presents for concern of thickened elongated and painful nails that are difficult to trim. Requesting to have them trimmed today.   PCP:  Plotnikov, Oakley Bellman, MD    . Denies any other pedal complaints. Denies n/v/f/c.   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 CFT <3 seconds. Absent hair growth on digits. Edema noted to bilateral lower extremities. Xerosis noted bilaterally.  Skin. No lacerations or abrasions bilateral feet. Nails 1-5 bilateral  are thickened discolored and elongated with subungual debris. Mild scaling noted to plantar right hallux  Musculoskeletal: MMT 5/5 bilateral lower extremities in DF, PF, Inversion and Eversion. Deceased ROM in DF of ankle joint.  Neurological: Sensation intact to light touch. Protective sensation intact bilateral.    Assessment:   1. Pain due to onychomycosis of toenails of both feet       Plan:  Patient was evaluated and treated and all questions answered. -Mechanically debrided all nails 1-5 bilateral using sterile nail nipper and filed with dremel without incident  -Answered all patient questions -Patient to return  in 3 months for at risk foot care -Patient advised to call the office if any problems or questions arise in the  meantime.   Jennefer Moats, DPM

## 2024-04-09 ENCOUNTER — Ambulatory Visit: Admitting: Podiatry

## 2024-04-09 ENCOUNTER — Encounter: Payer: Self-pay | Admitting: Podiatry

## 2024-04-09 DIAGNOSIS — M79674 Pain in right toe(s): Secondary | ICD-10-CM

## 2024-04-09 DIAGNOSIS — B351 Tinea unguium: Secondary | ICD-10-CM

## 2024-04-09 DIAGNOSIS — M79675 Pain in left toe(s): Secondary | ICD-10-CM | POA: Diagnosis not present

## 2024-04-09 NOTE — Progress Notes (Signed)
  Subjective:  Patient ID: Brian Mora, male    DOB: 07-10-1941,   MRN: [id number]  Chief Complaint  Patient presents with   Nail Problem    Trim my toenails, especially those big toes.    83 y.o. male presents for concern of thickened elongated and painful nails that are difficult to trim. Requesting to have them trimmed today.   PCP:  Plotnikov, Karlynn GAILS, MD    . Denies any other pedal complaints. Denies n/v/f/c.   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 CFT <3 seconds. Absent hair growth on digits. Edema noted to bilateral lower extremities. Xerosis noted bilaterally.  Skin. No lacerations or abrasions bilateral feet. Nails 1-5 bilateral  are thickened discolored and elongated with subungual debris. Mild scaling noted to plantar right hallux  Musculoskeletal: MMT 5/5 bilateral lower extremities in DF, PF, Inversion and Eversion. Deceased ROM in DF of ankle joint.  Neurological: Sensation intact to light touch. Protective sensation intact bilateral.    Assessment:   1. Pain due to onychomycosis of toenails of both feet       Plan:  Patient was evaluated and treated and all questions answered. -Mechanically debrided all nails 1-5 bilateral using sterile nail nipper and filed with dremel without incident  -Answered all patient questions -Patient to return  in 3 months for at risk foot care -Patient advised to call the office if any problems or questions arise in the meantime.   Asberry Failing, DPM

## 2024-04-14 ENCOUNTER — Encounter: Payer: Self-pay | Admitting: Internal Medicine

## 2024-04-14 ENCOUNTER — Ambulatory Visit: Admitting: Internal Medicine

## 2024-04-14 VITALS — BP 110/72 | HR 72 | Temp 98.4°F | Ht 74.0 in | Wt 186.2 lb

## 2024-04-14 DIAGNOSIS — I10 Essential (primary) hypertension: Secondary | ICD-10-CM

## 2024-04-14 DIAGNOSIS — L309 Dermatitis, unspecified: Secondary | ICD-10-CM

## 2024-04-14 DIAGNOSIS — M1991 Primary osteoarthritis, unspecified site: Secondary | ICD-10-CM | POA: Diagnosis not present

## 2024-04-14 DIAGNOSIS — H9193 Unspecified hearing loss, bilateral: Secondary | ICD-10-CM

## 2024-04-14 DIAGNOSIS — H9190 Unspecified hearing loss, unspecified ear: Secondary | ICD-10-CM | POA: Insufficient documentation

## 2024-04-14 MED ORDER — TRIAMCINOLONE ACETONIDE 0.1 % EX CREA: TOPICAL_CREAM | CUTANEOUS | 3 refills | Status: AC

## 2024-04-14 MED ORDER — TRAMADOL HCL 50 MG PO TABS: 50.0000 mg | ORAL_TABLET | Freq: Four times a day (QID) | ORAL | 2 refills | Status: AC | PRN

## 2024-04-14 NOTE — Progress Notes (Signed)
 Subjective:  Patient ID: Brian Mora, male    DOB: April 26, 1941  Age: 83 y.o. MRN: [id number]  CC: Follow-up (Patient asking for tramadol  and the cream for hands. )   HPI Brian Mora presents for OA, rash, hearing loss Pt lost wt - not new, wt goes up and down  Outpatient Medications Prior to Visit  Medication Sig Dispense Refill   Cholecalciferol  (VITAMIN D3) 50 MCG (2000 UT) capsule Take 1 capsule (2,000 Units total) by mouth daily.     ketoconazole  (NIZORAL ) 2 % cream Apply 1 Application topically daily. 60 g 2   Multiple Vitamins-Minerals (CENTRUM SILVER ULTRA MENS PO) Take 1 tablet by mouth daily.     traMADol  (ULTRAM ) 50 MG tablet Take 1 tablet (50 mg total) by mouth every 6 (six) hours as needed. 100 tablet 2   docusate sodium  (STOOL SOFTENER) 100 MG capsule Take 100 mg by mouth daily as needed for mild constipation. (Patient not taking: Reported on 04/14/2024)     No facility-administered medications prior to visit.    ROS: Review of Systems  Constitutional:  Negative for appetite change, fatigue and unexpected weight change.  HENT:  Positive for hearing loss. Negative for congestion, nosebleeds, sneezing, sore throat and trouble swallowing.   Eyes:  Negative for itching and visual disturbance.  Respiratory:  Negative for cough.   Cardiovascular:  Negative for chest pain, palpitations and leg swelling.  Gastrointestinal:  Negative for abdominal distention, blood in stool, diarrhea and nausea.  Genitourinary:  Negative for frequency and hematuria.  Musculoskeletal:  Positive for arthralgias and back pain. Negative for gait problem, joint swelling and neck pain.  Skin:  Negative for rash.  Neurological:  Negative for dizziness, tremors, speech difficulty and weakness.  Psychiatric/Behavioral:  Negative for agitation, dysphoric mood and sleep disturbance. The patient is not nervous/anxious.     Objective:  BP 110/72   Pulse 72   Temp 98.4 F (36.9 C) (Oral)   Ht  6' 2 (1.88 m)   Wt 186 lb 3.2 oz (84.5 kg)   SpO2 99%   BMI 23.91 kg/m   BP Readings from Last 3 Encounters:  04/14/24 110/72  10/12/23 110/70  04/05/23 130/82    Wt Readings from Last 3 Encounters:  04/14/24 186 lb 3.2 oz (84.5 kg)  10/12/23 195 lb (88.5 kg)  04/05/23 190 lb (86.2 kg)    Physical Exam Constitutional:      General: He is not in acute distress.    Appearance: Normal appearance. He is well-developed.     Comments: NAD  Eyes:     Conjunctiva/sclera: Conjunctivae normal.     Pupils: Pupils are equal, round, and reactive to light.  Neck:     Thyroid : No thyromegaly.     Vascular: No JVD.  Cardiovascular:     Rate and Rhythm: Normal rate and regular rhythm.     Heart sounds: Normal heart sounds. No murmur heard.    No friction rub. No gallop.  Pulmonary:     Effort: Pulmonary effort is normal. No respiratory distress.     Breath sounds: Normal breath sounds. No wheezing or rales.  Chest:     Chest wall: No tenderness.  Abdominal:     General: Bowel sounds are normal. There is no distension.     Palpations: Abdomen is soft. There is no mass.     Tenderness: There is no abdominal tenderness. There is no guarding or rebound.  Musculoskeletal:  General: Tenderness present. Normal range of motion.     Cervical back: Normal range of motion.  Lymphadenopathy:     Cervical: No cervical adenopathy.  Skin:    General: Skin is warm and dry.     Findings: No rash.  Neurological:     Mental Status: He is alert and oriented to person, place, and time.     Cranial Nerves: No cranial nerve deficit.     Motor: No abnormal muscle tone.     Coordination: Coordination normal.     Gait: Gait normal.     Deep Tendon Reflexes: Reflexes are normal and symmetric.  Psychiatric:        Behavior: Behavior normal.        Thought Content: Thought content normal.        Judgment: Judgment normal.   Hard hearing Ears - clear  Lab Results  Component Value Date    WBC 5.4 10/12/2023   HGB 13.6 10/12/2023   HCT 42.0 10/12/2023   PLT 226.0 10/12/2023   GLUCOSE 113 (H) 10/12/2023   CHOL 230 (H) 08/11/2021   TRIG 145.0 08/11/2021   HDL 56.80 08/11/2021   LDLDIRECT 164.0 08/27/2020   LDLCALC 144 (H) 08/11/2021   ALT 17 10/12/2023   AST 23 10/12/2023   NA 141 10/12/2023   K 3.9 10/12/2023   CL 103 10/12/2023   CREATININE 0.99 10/12/2023   BUN 11 10/12/2023   CO2 28 10/12/2023   TSH 1.06 10/12/2023   PSA 36.01 (H) 08/11/2021   INR 1.02 10/29/2014   HGBA1C 6.8 (H) 10/12/2023    No results found.  Assessment & Plan:   Problem List Items Addressed This Visit     Eczema   Cont w/Kenalog  in Eucerin topically      Hearing loss   Start using hearing aids      HTN (hypertension)   BP Readings from Last 3 Encounters:  04/14/24 110/72  10/12/23 110/70  04/05/23 130/82    Nl BP      Osteoarthritis - Primary   Tramadol  prn  Potential benefits of a long term opioids use as well as potential risks (i.e. addiction risk, apnea etc) and complications (i.e. Somnolence, constipation and others) were explained to the patient and were aknowledged.       Relevant Medications   traMADol  (ULTRAM ) 50 MG tablet      Meds ordered this encounter  Medications   traMADol  (ULTRAM ) 50 MG tablet    Sig: Take 1 tablet (50 mg total) by mouth every 6 (six) hours as needed.    Dispense:  100 tablet    Refill:  2   triamcinolone  cream (KENALOG ) 0.1 %    Sig: APPLY  CREAM EXTERNALLY TWICE DAILY    Dispense:  450 g    Refill:  3    In a jar please      Follow-up: Return in about 6 months (around 10/12/2024) for a follow-up visit.  Marolyn Noel, MD

## 2024-04-14 NOTE — Assessment & Plan Note (Signed)
 BP Readings from Last 3 Encounters:  04/14/24 110/72  10/12/23 110/70  04/05/23 130/82    Nl BP

## 2024-04-14 NOTE — Assessment & Plan Note (Signed)
Cont w/Kenalog in Eucerin topically

## 2024-04-14 NOTE — Assessment & Plan Note (Signed)
 Start using hearing aids

## 2024-04-14 NOTE — Assessment & Plan Note (Signed)
 Tramadol prn  Potential benefits of a long term opioids use as well as potential risks (i.e. addiction risk, apnea etc) and complications (i.e. Somnolence, constipation and others) were explained to the patient and were aknowledged.

## 2024-04-22 ENCOUNTER — Encounter: Payer: Self-pay | Admitting: Podiatry

## 2024-04-22 ENCOUNTER — Ambulatory Visit: Admitting: Podiatry

## 2024-04-22 ENCOUNTER — Ambulatory Visit (INDEPENDENT_AMBULATORY_CARE_PROVIDER_SITE_OTHER)

## 2024-04-22 DIAGNOSIS — M2012 Hallux valgus (acquired), left foot: Secondary | ICD-10-CM

## 2024-04-22 DIAGNOSIS — M205X2 Other deformities of toe(s) (acquired), left foot: Secondary | ICD-10-CM | POA: Diagnosis not present

## 2024-04-22 NOTE — Progress Notes (Addendum)
 Subjective:  Patient ID: Brian Mora, male    DOB: [birth year],   MRN: [id number]  Chief Complaint  Patient presents with   Nail Problem    I need my right big toenail filed down.  They can't seem to get it right.   Bunions    They're going to xray my foot.  I want to discuss surgery. (Bunion left)    83 y.o. male presents for concern of left bunion deformity. Relates this left foot has been aching and sore. It comes and goes. He has tried different shoes and different paddings and still causing a problem. He would like to definitely fix the problem.   PCP:  Plotnikov, Karlynn GAILS, MD    . Denies any other pedal complaints. Denies n/v/f/c.   Past Medical History:  Diagnosis Date   Acquired hypertrophic pyloric stenosis    gastric outlet obstruction   Dyspepsia and other specified disorders of function of stomach    GERD (gastroesophageal reflux disease)    nexium    as needed   Hyperlipidemia    Other esophagitis    erosive esophagitis   Primary localized osteoarthritis of left knee    Sexually transmitted disease (STD)    syphyillis in youth-medicdally treated   Urgency of urination     Objective:  Physical Exam: Vascular: DP/PT pulses 1/4 bilateral. CFT <3 seconds. Absent hair growth on digits. Edema noted to bilateral lower extremities. Xerosis noted bilaterally.  Skin. No lacerations or abrasions bilateral feet. Nails 1-5 bilateral  are thickened discolored and elongated with subungual debris. Mild scaling noted to plantar right hallux  Musculoskeletal: MMT 5/5 bilateral lower extremities in DF, PF, Inversion and Eversion. Deceased ROM in DF of ankle joint. 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 Tender to palpation of medial eminence. Some pain with Rom of the first MPJ. No hypermobility noted at first ray .  Neurological: Sensation intact to light touch. Protective sensation intact bilateral.    Assessment:   1. Hallux valgus of left foot   2. Hallux limitus, left        Plan:  Patient was evaluated and treated and all questions answered. -Xrays reviewed. No acute fractures or dislocations. HAV deformity noted on left with IM 1-2 angle of about 13 degrees. Significant degenerative changes noted to the first MPJ. Sesamoid position of 5.  -Discussed HAV and hallux limitusand treatment options;conservative and surgical management; risks, benefits, alternatives discussed. All patient's questions answered. -Discussed padding and wide shoe gear.   -Recommend continue with good supportive shoes and inserts.  -Discussed surgical options. Patient has tried conservative treatments and would like to consider surgery.  Discussed first metatrsophalangeal joint fusion. Discussed perioperative course and disucssed risk in detail especially given his age. Patient would like to still proceed. Would like to check vascular status prior to proceeding as difficult to palpate pulses. If this are normal can proceed with first MPJ fusion  -Informed surgical risk consent was reviewed and read aloud to the patient.  I reviewed the films.  I have discussed my findings with the patient in great detail.  I have discussed all risks including but not limited to infection, stiffness, scarring, limp, disability, deformity, damage to blood vessels and nerves, numbness, poor healing, need for braces, arthritis, chronic pain, amputation, death.  All benefits and realistic expectations discussed in great detail.  I have made no promises as to the outcome.  I have provided realistic expectations.  I have offered the patient a 2nd opinion, which  they have declined and assured me they preferred to proceed despite the risks. Post-op meds: Zofran , oxycodone , ASA bid, keflex.   ABIS abormal and would like evaluated by Vascular before proceeding with surgery.   Asberry Failing, DPM

## 2024-04-30 ENCOUNTER — Telehealth: Payer: Self-pay | Admitting: Podiatry

## 2024-04-30 NOTE — Telephone Encounter (Signed)
 Called pt to get surgery scheduled. Called cell and  was hung up on, called home and it just rings

## 2024-05-14 ENCOUNTER — Ambulatory Visit (HOSPITAL_COMMUNITY)
Admission: RE | Admit: 2024-05-14 | Discharge: 2024-05-14 | Disposition: A | Discharge status: Home / Self Care | Source: Ambulatory Visit | Attending: Podiatry | Admitting: Podiatry

## 2024-05-14 DIAGNOSIS — M2012 Hallux valgus (acquired), left foot: Secondary | ICD-10-CM | POA: Insufficient documentation

## 2024-05-14 LAB — VAS US ABI WITH/WO TBI
Left ABI: 0.95
Right ABI: 0.91

## 2024-05-14 NOTE — Addendum Note (Signed)
 Addended by: Leathia Farnell R on: 05/14/2024 04:00 PM   Modules accepted: Orders

## 2024-07-09 ENCOUNTER — Ambulatory Visit: Admitting: Podiatry

## 2024-10-13 ENCOUNTER — Ambulatory Visit: Admitting: Internal Medicine
